# Patient Record
Sex: Male | Born: 1935 | Race: Black or African American | Hispanic: No | Marital: Married | Smoking: Never smoker
Health system: Southern US, Community
[De-identification: ages and names within clinical notes are randomized; demographics above are authoritative.]

## PROBLEM LIST (undated history)

## (undated) DIAGNOSIS — N183 Chronic kidney disease, stage 3 (moderate): Secondary | ICD-10-CM

## (undated) DIAGNOSIS — K760 Fatty (change of) liver, not elsewhere classified: Secondary | ICD-10-CM

## (undated) DIAGNOSIS — I10 Essential (primary) hypertension: Secondary | ICD-10-CM

## (undated) DIAGNOSIS — I5043 Acute on chronic combined systolic (congestive) and diastolic (congestive) heart failure: Secondary | ICD-10-CM

## (undated) DIAGNOSIS — N281 Cyst of kidney, acquired: Secondary | ICD-10-CM

## (undated) DIAGNOSIS — I2510 Atherosclerotic heart disease of native coronary artery without angina pectoris: Secondary | ICD-10-CM

## (undated) DIAGNOSIS — D649 Anemia, unspecified: Secondary | ICD-10-CM

## (undated) HISTORY — DX: Morbid (severe) obesity due to excess calories: E66.01

## (undated) HISTORY — PX: TONSILLECTOMY: SUR1361

## (undated) HISTORY — DX: Essential (primary) hypertension: I10

## (undated) HISTORY — DX: Atherosclerotic heart disease of native coronary artery without angina pectoris: I25.10

---

## 2010-08-25 ENCOUNTER — Institutional Professional Consult (permissible substitution) (INDEPENDENT_AMBULATORY_CARE_PROVIDER_SITE_OTHER): Payer: Medicare Other | Admitting: Cardiovascular Disease

## 2010-08-25 ENCOUNTER — Encounter: Payer: Self-pay | Admitting: Cardiovascular Disease

## 2010-08-25 DIAGNOSIS — R9431 Abnormal electrocardiogram [ECG] [EKG]: Secondary | ICD-10-CM | POA: Insufficient documentation

## 2010-09-01 NOTE — Assessment & Plan Note (Signed)
Summary: consult:ekg changes. q waves- pt has medicade. per dod called...   Visit Type:  Initial Consult Primary Provider:  Dr. [name]  CC:  LE edema.  History of Present Illness: 75 yo AAM with history of HTN and recent lower extremity edema who is referred today for evaluation of an abnormal EKG. He tells me that he has been healthy up until two weeks ago when he began to notice swelling in both legs. He was started on Lasix by primary care and has had considerable improvement in his lower ext edema. He has recently been started medication for DM and HTN. He has no complaints today. No chest pain, SOB, palpitations, near syncope or syncope. EKG in his primary care office reported as abnormal but no records available for review. EKG today with NSR, Q waves III, AVF and poor R wave progression through the precordial leads.   Current Medications (verified): 1)  Lisinopril 20 Mg Tabs (Lisinopril) .Marland Kitchen.. 1 By Mouth Daily 2)  Furosemide 40 Mg Tabs (Furosemide) .Marland Kitchen.. 1 By Mouth Daily 3)  Metformin Hcl 500 Mg Tabs (Metformin Hcl) .Marland Kitchen.. 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  The patient denies fatigue, malaise, fever, weight gain/loss, vision loss, decreased hearing, hoarseness, chest pain, palpitations, shortness of breath, prolonged cough, wheezing, sleep apnea, coughing up blood, abdominal pain, blood in stool, nausea, vomiting, diarrhea, heartburn, incontinence, blood in urine, muscle weakness, joint pain, rash, skin lesions,  headache, fainting, dizziness, depression, anxiety, enlarged lymph nodes, easy bruising or bleeding, and environmental allergies.    Vital Signs:  Patient profile:   75 year old male Height:      70 inches Weight:      309 pounds BMI:     44.50 Pulse rate:   93 / minute Resp:     18 per minute BP sitting:   140 / 90  (left arm)  Vitals Entered By: Marrion Coy, CNA (August 25, 2010 11:30 AM)  Physical Exam  General:  General: Well developed, well nourished, NAD HEENT: OP clear, mucus membranes moist SKIN: warm, dry Neuro: No focal deficits Musculoskeletal: Muscle strength 5/5 all ext Psychiatric: Mood and affect normal Neck: No JVD, no carotid bruits, no thyromegaly, no lymphadenopathy. Lungs:Clear bilaterally, no wheezes, rhonci, crackles CV: RRR no murmurs, gallops rubs Abdomen: soft, NT, ND, BS present Extremities: 1+ bilateral lower ext  edema, pulses 2+.    EKG  Procedure date:  08/25/2010  Findings:      NSR, rate 93 bpm. Q waves III, AVF. Poor R wave progression through precordial leads.   Impression & Recommendations:  Problem # 1:  ABNORMAL ELECTROCARDIOGRAM (ICD-794.31) Will arrange echo to assess LV size and function. No chest pain. No ischemic workup necessary at this time.   His updated medication list for this problem includes:    Lisinopril 20 Mg Tabs (Lisinopril) .Marland Kitchen... 1 by mouth daily  Orders: Echocardiogram (Echo)  Patient Instructions: 1)  Your physician recommends that you schedule a follow-up appointment in: 3-4 weeks with Dr. Clifton James 2)  Your physician  recommends that you continue on your current medications as directed. Please refer to the Current Medication list given to you today. 3)  Your physician has requested that you have an echocardiogram.  Echocardiography is a painless test that uses sound waves to create images of your heart. It provides your doctor with information about the size and shape of your heart and how well your heart's  chambers and valves are working.  This procedure takes approximately one hour. There are no restrictions for this procedure.

## 2010-09-04 ENCOUNTER — Encounter: Payer: Self-pay | Admitting: *Deleted

## 2010-09-10 ENCOUNTER — Other Ambulatory Visit (HOSPITAL_COMMUNITY): Payer: Self-pay | Admitting: Radiology

## 2010-09-10 DIAGNOSIS — R9431 Abnormal electrocardiogram [ECG] [EKG]: Secondary | ICD-10-CM

## 2010-09-11 ENCOUNTER — Ambulatory Visit (HOSPITAL_COMMUNITY): Payer: Medicare Other | Attending: Cardiovascular Disease | Admitting: Radiology

## 2010-09-11 DIAGNOSIS — E669 Obesity, unspecified: Secondary | ICD-10-CM | POA: Insufficient documentation

## 2010-09-11 DIAGNOSIS — I10 Essential (primary) hypertension: Secondary | ICD-10-CM | POA: Insufficient documentation

## 2010-09-11 DIAGNOSIS — R609 Edema, unspecified: Secondary | ICD-10-CM | POA: Insufficient documentation

## 2010-09-11 DIAGNOSIS — R9431 Abnormal electrocardiogram [ECG] [EKG]: Secondary | ICD-10-CM | POA: Insufficient documentation

## 2010-09-11 DIAGNOSIS — E119 Type 2 diabetes mellitus without complications: Secondary | ICD-10-CM | POA: Insufficient documentation

## 2010-09-14 ENCOUNTER — Ambulatory Visit (INDEPENDENT_AMBULATORY_CARE_PROVIDER_SITE_OTHER): Payer: Medicare Other | Admitting: Cardiovascular Disease

## 2010-09-14 ENCOUNTER — Encounter: Payer: Self-pay | Admitting: Cardiovascular Disease

## 2010-09-14 VITALS — BP 160/98 | HR 84 | Wt 310.4 lb

## 2010-09-14 DIAGNOSIS — I5023 Acute on chronic systolic (congestive) heart failure: Secondary | ICD-10-CM

## 2010-09-14 DIAGNOSIS — I5022 Chronic systolic (congestive) heart failure: Secondary | ICD-10-CM

## 2010-09-14 DIAGNOSIS — I10 Essential (primary) hypertension: Secondary | ICD-10-CM | POA: Insufficient documentation

## 2010-09-14 LAB — BASIC METABOLIC PANEL
GFR: 93.15 mL/min (ref >60.00)
Potassium: 4.2 mEq/L (ref 3.5–5.1)
Sodium: 137 mEq/L (ref 135–145)

## 2010-09-14 MED ORDER — ASPIRIN EC 81 MG PO TBEC: 81.0000 mg | DELAYED_RELEASE_TABLET | Freq: Every day | ORAL | Status: AC

## 2010-09-14 MED ORDER — CARVEDILOL 6.25 MG PO TABS: 6.2500 mg | ORAL_TABLET | Freq: Two times a day (BID) | ORAL | Status: DC

## 2010-09-14 MED ORDER — FUROSEMIDE 40 MG PO TABS: 40.0000 mg | ORAL_TABLET | Freq: Two times a day (BID) | ORAL | Status: DC

## 2010-09-14 NOTE — Assessment & Plan Note (Signed)
New diagnosis of systolic dysfunction. LVEF 30% by echo. Will arrange exercise myoview to exclude ischemia. Most likely due to long standing HTN. Will increase Lasix to 40 mg po BID and check BMET today. Will add ASA 81 mg po Qdaily.

## 2010-09-14 NOTE — Patient Instructions (Signed)
Your physician recommends that you schedule a follow-up appointment in: 2-3 weeks with Dr. Clifton James Your physician has recommended you make the following change in your medication: INCREASE LASIX to 40mg  by mouth daily. START COREG 6.25mg  by mouth two times a day. START ASPIRIN 81mg  by mouth daily.  Your physician has requested that you have en exercise stress myoview. For further information please visit [email or web address]. Please follow instruction sheet, as given.

## 2010-09-14 NOTE — Progress Notes (Signed)
History of Present Illness:75 yo AAM with history of HTN and recent lower extremity edema who is here today for cardiac follow up. I saw him as a new patient three weeks ago for evaluation of an abnormal EKG. He told  me that he has been healthy up until 5 weeks ago  when he began to notice swelling in both legs. He was started on Lasix by primary care and had considerable improvement in his lower ext edema for 1-2 weeks. He has recently been started medication for DM and HTN. He had no complaints at the first visit.  EKG at the initial visit with NSR, Q waves III, AVF and poor R wave progression through the precordial leads. I arranged an echo which showed LV systolic dysfunction although the endocardium was difficult to visualize. He has been feeling well. Denies any CP, SOB, palpitations. His lower extremity swelling has increased since last visit.    Past Medical History  Diagnosis Date  . Hypertension   . Diabetes mellitus     No past surgical history on file.  Current Outpatient Prescriptions  Medication Sig Dispense Refill  . furosemide (LASIX) 40 MG tablet Take 40 mg by mouth daily.        Marland Kitchen lisinopril (PRINIVIL,ZESTRIL) 20 MG tablet Take 20 mg by mouth daily.        . metFORMIN (GLUCOPHAGE) 500 MG tablet Take 500 mg by mouth 2 (two) times daily with a meal.          Allergies not on file  History   Social History  . Marital Status: Married    Spouse Name: N/A    Number of Children: 1  . Years of Education: N/A   Occupational History  . driver     [employer]   Social History Main Topics  . Smoking status: Never Smoker   . Smokeless tobacco: Not on file  . Alcohol Use: No  . Drug Use: No  . Sexually Active: Not on file   Other Topics Concern  . Not on file   Social History Narrative   Driver for [employer].    No family history on file.  Review of Systems:  No chest pain, SOB, palpitations, dizziness,  near syncope or syncope.  No PND,  orthopnea, or Lower extremity edema.   BP 160/98  Pulse 84  Wt 310 lb 6.4 oz (140.797 kg)  Physical Examination: General: Well developed, well nourished, NAD HEENT: OP clear, mucus membranes moist SKIN: warm, dry. No rashes. Neuro: No focal deficits Musculoskeletal: Muscle strength 5/5 all ext Psychiatric: Mood and affect normal Neck: No JVD, no carotid bruits, no thyromegaly, no lymphadenopathy. Lungs:Clear bilaterally, no wheezes, rhonci, crackles Cardiovascular: Regular rate and rhythm. No murmurs, gallops or rubs. Abdomen:Soft. Bowel sounds present. Non-tender.  Extremities: 2-3+ bilateral lower  extremity edema. Pulses are difficult to palpate with edema.   Echo: 09/11/10 Left ventricle: The cavity size was mildly dilated. Wall thickness       was increased in a pattern of mild LVH. Systolic function was       moderately to severely reduced. The estimated ejection fraction       was in the range of 30% to 35%. Diffuse hypokinesis. Regional wall       motion abnormalities cannot be excluded.     - Left atrium: The atrium was mildly dilated.     - Right ventricle: The cavity size was mildly dilated.     - Right  atrium: The atrium was mildly dilated.     - Pericardium, extracardiac: A trivial pericardial effusion was       identified.     Impressions:            - Technically difficult; LV function appears to be significantly       reduced on apical images but endocardium not well visualized;       suggest cardiac MRI or MUGA if clinically indicated.

## 2010-09-14 NOTE — Assessment & Plan Note (Signed)
Uncontrolled. Will add Coreg 6.25 mg po BID. Continue Ace-inhibitor.

## 2010-09-28 ENCOUNTER — Ambulatory Visit (HOSPITAL_COMMUNITY): Payer: Medicare Other | Attending: Cardiovascular Disease | Admitting: Radiology

## 2010-09-28 DIAGNOSIS — I491 Atrial premature depolarization: Secondary | ICD-10-CM

## 2010-09-28 DIAGNOSIS — R0602 Shortness of breath: Secondary | ICD-10-CM

## 2010-09-28 DIAGNOSIS — I4949 Other premature depolarization: Secondary | ICD-10-CM

## 2010-09-28 DIAGNOSIS — I5023 Acute on chronic systolic (congestive) heart failure: Secondary | ICD-10-CM

## 2010-09-28 MED ORDER — TECHNETIUM TC 99M TETROFOSMIN IV KIT
33.0000 | PACK | Freq: Once | INTRAVENOUS | Status: AC | PRN
  Administered 2010-09-28: 33 via INTRAVENOUS

## 2010-09-28 MED ORDER — REGADENOSON 0.4 MG/5ML IV SOLN
0.4000 mg | Freq: Once | INTRAVENOUS | Status: AC
  Administered 2010-09-28: 0.4 mg via INTRAVENOUS

## 2010-09-28 NOTE — Progress Notes (Signed)
[facility] SITE 3 NUCLEAR MED [address] [place] [address] [phone]  Cardiology Nuclear Med Study  Tim Herrera is a 75 y.o. male [id number] 04/18/1936   Nuclear Med Background Indication for Stress Test:  Evaluation for Ischemia History: 09/11/10 Echo EF 30-35% Cardiac Risk Factors: Hypertension and NIDDM  Symptoms:  DOE, Palpitations and SOB   Nuclear Pre-Procedure Caffeine/Decaff Intake:  None NPO After: 600 pm   Lungs:  clear IV 0.9% NS with Angio Cath:  22g  IV Site: R Hand  IV Started by:  Cathlyn Parsons, RN  Chest Size (in):  52 in Cup Size: n/a  Height: 5' 10.5" (1.791 m)  Weight:  307 lb (139.254 kg)  BMI:  Body mass index is 43.43 kg/(m^2). Tech Comments:  Cored held x 24 hrs and Metformin held this am and patient does not check Blood sugar every am,none this am.    Nuclear Med Study 1 or 2 day study: 2 Day Stress Test Type:  Eugenie Birks  Reading MD: Olga Millers  Order Authorizing Provider:  C.McAlhany  Resting Radionuclide: Technetium 19m Tetrofosmin  Resting Radionuclide Dose: 33 mCi   Stress Radionuclide:  Technetium 58m Tetrofosmin  Stress Radionuclide Dose: 33 mCi           Stress Protocol Rest HR: 83 Stress HR: 88  Rest BP: 138/98 Stress BP: 165/88  Exercise Time (min): n/a METS: n/a   Predicted Max HR: 148 bpm % Max HR: 59.46 bpm Rate Pressure Product: [id number]   Dose of Adenosine (mg):  n/a Dose of Lexiscan: 0.4 mg  Dose of Atropine (mg): n/a Dose of Dobutamine: n/a mcg/kg/min (at max HR)  Stress Test Technologist: Milana Na, EMT-P  Nuclear Technologist:  Domenic Polite, CNMT     Rest Procedure:  Myocardial perfusion imaging was performed at rest 45 minutes following the intravenous administration of Technetium 53m Tetrofosmin. Rest ECG: NSR with PVCS  Stress Procedure:  The patient received IV Lexiscan 0.4 mg over 15-seconds.  Technetium 6m Tetrofosmin injected at 30-seconds.  There were no significant  changes and rare pvcs with Lexiscan.  Quantitative spect images were obtained after a 45 minute delay. Stress ECG: No significant ST segment change suggestive of ischemia.  QPS Raw Data Images:  Acquisition technically good. Stress Images:  There is decreased uptake in the inferior wall and basal anterior wall. Rest Images:  There is decreased uptake in the inferior wall. Subtraction (SDS):  These findings are consistent with prior inferior infarct and mild ischemia in the basal anterior wall. Transient Ischemic Dilatation (Normal <1.2 ) 1.03 Lung/Heart Ratio (Normal <0.45):  .35   Quantitative Gated Spect Images QGS EDV:  187 ml QGS ESV:  119 ml QGS cine images:  Akinesis of the inferior wall; evidence of left ventricular enlargement. QGS EF: 37%  Impression Exercise Capacity:  Lexiscan with no exercise. BP Response:  Normal blood pressure response. Clinical Symptoms:  No chest pain. ECG Impression:  No significant ST segment change suggestive of ischemia. Comparison with Prior Nuclear Study: No previous nuclear study performed  Overall Impression:  Abnormal stress nuclear study with prior inferior infarct and mild ischemia in the basal anterior wall.   Olga Millers

## 2010-09-29 ENCOUNTER — Ambulatory Visit (HOSPITAL_COMMUNITY): Payer: Medicare Other | Attending: Cardiovascular Disease | Admitting: Radiology

## 2010-09-29 DIAGNOSIS — R0989 Other specified symptoms and signs involving the circulatory and respiratory systems: Secondary | ICD-10-CM

## 2010-09-29 DIAGNOSIS — I10 Essential (primary) hypertension: Secondary | ICD-10-CM | POA: Insufficient documentation

## 2010-09-29 MED ORDER — TECHNETIUM TC 99M TETROFOSMIN IV KIT
33.0000 | PACK | Freq: Once | INTRAVENOUS | Status: AC | PRN
  Administered 2010-09-29: 33 via INTRAVENOUS

## 2010-10-06 NOTE — Progress Notes (Signed)
Abnormal nuclear study. He needs to be set up for a cath. Can we add him onto my schedule for next week or two? Thanks, chris  MCALHANY,CHRISTOPHER 5:17 PM

## 2010-10-07 NOTE — Progress Notes (Signed)
Patient is aware of test/lab results. He will f/u on 10/13/10 @ 11:45 am.

## 2010-10-13 ENCOUNTER — Ambulatory Visit (INDEPENDENT_AMBULATORY_CARE_PROVIDER_SITE_OTHER): Payer: Medicare Other | Admitting: Cardiovascular Disease

## 2010-10-13 ENCOUNTER — Encounter: Payer: Self-pay | Admitting: Cardiology

## 2010-10-13 ENCOUNTER — Encounter: Payer: Self-pay | Admitting: Cardiovascular Disease

## 2010-10-13 VITALS — BP 144/78 | HR 78 | Resp 14 | Ht 70.0 in | Wt 313.0 lb

## 2010-10-13 DIAGNOSIS — R9430 Abnormal result of cardiovascular function study, unspecified: Secondary | ICD-10-CM

## 2010-10-13 DIAGNOSIS — R079 Chest pain, unspecified: Secondary | ICD-10-CM

## 2010-10-13 DIAGNOSIS — Z01810 Encounter for preprocedural cardiovascular examination: Secondary | ICD-10-CM

## 2010-10-13 NOTE — Patient Instructions (Addendum)
Your physician recommends that you schedule a follow-up appointment in: 4 months with Dr. Clifton James  Your physician has requested that you have a cardiac catheterization. Cardiac catheterization is used to diagnose and/or treat various heart conditions. Doctors may recommend this procedure for a number of different reasons. The most common reason is to evaluate chest pain. Chest pain can be a symptom of coronary artery disease (CAD), and cardiac catheterization can show whether plaque is narrowing or blocking your heart's arteries. This procedure is also used to evaluate the valves, as well as measure the blood flow and oxygen levels in different parts of your heart. For further information please visit [email or web address] Please follow instruction sheet, as given. 10/30/10 @ 8:30am with Dr. Clifton James.

## 2010-10-13 NOTE — Progress Notes (Signed)
History of Present Illness:75 yo AAM with history of HTN and recent lower extremity edema who I saw as a new pt 6 weeks ago for evaluation of an abnormal EKG. He tells me that he has been healthy up until two weeks ago when he began to notice swelling in both legs. He was started on Lasix by primary care and has had considerable improvement in his lower ext edema. He has recently been started medication for DM and HTN. No chest pain, SOB, palpitations, near syncope or syncope. EKG today with NSR, Q waves III, AVF and poor R wave progression through the precordial leads. Echo as below.   Past Medical History  Diagnosis Date  . Hypertension   . Diabetes mellitus     No past surgical history on file.  Current Outpatient Prescriptions  Medication Sig Dispense Refill  . aspirin EC 81 MG EC tablet Take 1 tablet (81 mg total) by mouth daily.  150 tablet  2  . carvedilol (COREG) 6.25 MG tablet Take 1 tablet (6.25 mg total) by mouth 2 (two) times daily.  60 tablet  3  . furosemide (LASIX) 40 MG tablet Take 1 tablet (40 mg total) by mouth 2 (two) times daily.  60 tablet  3  . lisinopril (PRINIVIL,ZESTRIL) 20 MG tablet Take 20 mg by mouth daily.        . metFORMIN (GLUCOPHAGE) 500 MG tablet Take 500 mg by mouth 2 (two) times daily with a meal.          No Known Allergies  History   Social History  . Marital Status: Married    Spouse Name: N/A    Number of Children: 1  . Years of Education: N/A   Occupational History  . driver     [employer]   Social History Main Topics  . Smoking status: Never Smoker   . Smokeless tobacco: Not on file  . Alcohol Use: No  . Drug Use: No  . Sexually Active: Not on file   Other Topics Concern  . Not on file   Social History Narrative   Driver for [employer].    No family history on file.  Review of Systems:  As stated in the HPI and otherwise negative.   BP 144/78  Pulse 78  Resp 14  Ht 5\' 10"  (1.778 m)  Wt 313 lb  (141.976 kg)  BMI 44.91 kg/m2  Physical Examination: General: Well developed, well nourished, NAD HEENT: OP clear, mucus membranes moist SKIN: warm, dry. No rashes. Neuro: No focal deficits Musculoskeletal: Muscle strength 5/5 all ext Psychiatric: Mood and affect normal Neck: No JVD, no carotid bruits, no thyromegaly, no lymphadenopathy. Lungs:Clear bilaterally, no wheezes, rhonci, crackles Cardiovascular: Regular rate and rhythm. No murmurs, gallops or rubs. Abdomen:Soft. Bowel sounds present. Non-tender.  Extremities: No lower extremity edema. Pulses are 2 + in the bilateral DP/PT.  Echo: 09/11/10:  Left ventricle: The cavity size was mildly dilated. Wall thickness       was increased in a pattern of mild LVH. Systolic function was       moderately to severely reduced. The estimated ejection fraction       was in the range of 30% to 35%. Diffuse hypokinesis. Regional wall       motion abnormalities cannot be excluded.     - Left atrium: The atrium was mildly dilated.     - Right ventricle: The cavity size was mildly dilated.     -  Right atrium: The atrium was mildly dilated.     - Pericardium, extracardiac: A trivial pericardial effusion was       identified.     Impressions:            - Technically difficult; LV function appears to be significantly       reduced on apical images but endocardium not well visualized;       suggest cardiac MRI or MUGA if clinically indicated.  STRESS MYOVIEW:    Stress Procedure:  The patient received IV Lexiscan 0.4 mg over 15-seconds.  Technetium 42m Tetrofosmin injected at 30-seconds.  There were no significant changes and rare pvcs with Lexiscan.  Quantitative spect images were obtained after a 45 minute delay. Stress ECG: No significant ST segment change suggestive of ischemia.   QPS Raw Data Images:  Acquisition technically good. Stress Images:  There is decreased uptake in the inferior wall and basal anterior wall. Rest Images:   There is decreased uptake in the inferior wall. Subtraction (SDS):  These findings are consistent with prior inferior infarct and mild ischemia in the basal anterior wall. Transient Ischemic Dilatation (Normal <1.2 ) 1.03 Lung/Heart Ratio (Normal <0.45):  .35     Quantitative Gated Spect Images QGS EDV:  187 ml QGS ESV:  119 ml QGS cine images:  Akinesis of the inferior wall; evidence of left ventricular enlargement. QGS EF: 37%   Impression Exercise Capacity:  Lexiscan with no exercise. BP Response:  Normal blood pressure response. Clinical Symptoms:  No chest pain. ECG Impression:  No significant ST segment change suggestive of ischemia. Comparison with Prior Nuclear Study: No previous nuclear study performed   Overall Impression:  Abnormal stress nuclear study with prior inferior infarct and mild ischemia in the basal anterior wall.

## 2010-10-13 NOTE — Assessment & Plan Note (Signed)
Echo with LV sytstolic dysfunction. Stress myoview with possible inferior wall ischemia. Will arrange left heart cath at [facility]. Labs week of procedure. Risks and benefits of procedure reviewed with pt and he agrees to proceed.

## 2010-10-27 ENCOUNTER — Other Ambulatory Visit (INDEPENDENT_AMBULATORY_CARE_PROVIDER_SITE_OTHER): Payer: Medicare Other | Admitting: *Deleted

## 2010-10-27 DIAGNOSIS — Z01810 Encounter for preprocedural cardiovascular examination: Secondary | ICD-10-CM

## 2010-10-27 DIAGNOSIS — R079 Chest pain, unspecified: Secondary | ICD-10-CM

## 2010-10-27 LAB — CBC WITH DIFFERENTIAL/PLATELET
Basophils Relative: 0.7 % (ref 0.0–3.0)
Eosinophils Absolute: 0.6 10*3/uL (ref 0.0–0.7)
Eosinophils Relative: 12.1 % — ABNORMAL HIGH (ref 0.0–5.0)
Hemoglobin: 10.2 g/dL — ABNORMAL LOW (ref 13.0–17.0)
Lymphocytes Relative: 16.2 % (ref 12.0–46.0)
MCHC: 33.2 g/dL (ref 30.0–36.0)
MCV: 82.3 fl (ref 78.0–100.0)
Monocytes Absolute: 0.6 10*3/uL (ref 0.1–1.0)
Neutro Abs: 3 10*3/uL (ref 1.4–7.7)
RBC: 3.73 Mil/uL — ABNORMAL LOW (ref 4.22–5.81)
WBC: 5 10*3/uL (ref 4.5–10.5)

## 2010-10-27 LAB — PROTIME-INR: INR: 1.2 ratio — ABNORMAL HIGH (ref 0.8–1.0)

## 2010-10-27 LAB — BASIC METABOLIC PANEL
CO2: 27 mEq/L (ref 19–32)
Chloride: 105 mEq/L (ref 96–112)
Potassium: 3.8 mEq/L (ref 3.5–5.1)
Sodium: 141 mEq/L (ref 135–145)

## 2010-10-30 ENCOUNTER — Inpatient Hospital Stay (HOSPITAL_BASED_OUTPATIENT_CLINIC_OR_DEPARTMENT_OTHER)
Admission: RE | Admit: 2010-10-30 | Discharge: 2010-10-30 | Disposition: A | Discharge status: Home / Self Care | Payer: Medicare Other | Source: Ambulatory Visit | Attending: Cardiovascular Disease | Admitting: Cardiovascular Disease

## 2010-10-30 DIAGNOSIS — I2510 Atherosclerotic heart disease of native coronary artery without angina pectoris: Secondary | ICD-10-CM | POA: Insufficient documentation

## 2010-10-30 DIAGNOSIS — R9431 Abnormal electrocardiogram [ECG] [EKG]: Secondary | ICD-10-CM | POA: Insufficient documentation

## 2010-10-30 DIAGNOSIS — R609 Edema, unspecified: Secondary | ICD-10-CM | POA: Insufficient documentation

## 2010-10-30 DIAGNOSIS — I10 Essential (primary) hypertension: Secondary | ICD-10-CM | POA: Insufficient documentation

## 2010-10-30 DIAGNOSIS — E119 Type 2 diabetes mellitus without complications: Secondary | ICD-10-CM | POA: Insufficient documentation

## 2010-10-30 LAB — POCT I-STAT GLUCOSE
Glucose, Bld: 168 mg/dL — ABNORMAL HIGH (ref 70–99)
Operator id: 122531

## 2010-11-06 ENCOUNTER — Other Ambulatory Visit (INDEPENDENT_AMBULATORY_CARE_PROVIDER_SITE_OTHER): Payer: Medicare Other | Admitting: *Deleted

## 2010-11-06 DIAGNOSIS — E876 Hypokalemia: Secondary | ICD-10-CM

## 2010-11-06 LAB — BASIC METABOLIC PANEL
Chloride: 104 mEq/L (ref 96–112)
GFR: 63.36 mL/min (ref >60.00)
Potassium: 4.5 mEq/L (ref 3.5–5.1)

## 2010-11-10 NOTE — Cardiovascular Report (Signed)
NAMELADD, CEN                ACCOUNT NO.:  [id number]  MEDICAL RECORD NO.:  [id number]           PATIENT TYPE:  LOCATION:                                 FACILITY:  PHYSICIAN:  Verne Carrow, MDDATE OF BIRTH:  [birth year]  DATE OF PROCEDURE:  10/30/2010 DATE OF DISCHARGE:                           CARDIAC CATHETERIZATION   PROCEDURES PERFORMED: 1. Left heart catheterization 2. Selective coronary angiography.  OPERATOR:  Verne Carrow, MD  INDICATIONS:  This is a 75 year old African American male with a history of hypertension, lower extremity edema, and recently diagnosed diabetes mellitus, who I saw in the office several months ago for evaluation of an abnormal EKG.  He has recently noticed swelling in his legs but denied any chest pain or shortness of breath.  I arranged an echocardiogram which showed reduced left ventricular systolic function with ejection fraction of 30-35%.  There was diffuse hypokinesis.  We then arranged a stress Myoview which showed possible inferior wall ischemia.  Left ventricular ejection fraction on the Myoview was 37%. Because of this, we arranged a diagnostic left heart catheterization to exclude obstructive coronary artery disease in the major epicardial vessels.  DETAILS OF PROCEDURE:  The patient was brought to the outpatient cardiac catheterization laboratory after signing informed consent for the procedure.  The right groin was prepped and draped in sterile fashion. A 1% lidocaine was used for local anesthesia.  A 4-French sheath was inserted into the right femoral artery without difficulty.  We then attempted to perform angiography of the left coronary system with a 4- French catheter.  However, there was poor opacification of the vessel secondary to the patient's size.  We changed her sheath out for a 5- [place] sheath and used a 5-French JL-5 diagnostic catheter to selectively engage the left main artery.  We then used  a 4-French 3D RC catheter to selectively engage and inject the right coronary artery.  A pigtail catheter was used across the aortic valve into the left ventricle.  The patient's left ventricular end-diastolic filling pressure was elevated.  Because of this, we did not perform a left ventricular angiogram.  We did pull a pigtail catheter back in an attempt to perform a distal aortogram with 20 mL of contrast dye.  Due to the patient's size, there was poor visualization of the distal aorta and renal arteries.  At this point, we stopped the procedure.  There were no immediate complications.  The patient was taken to the recovery area in stable condition.  HEMODYNAMIC FINDINGS:  Central aortic pressure 165/92, left ventricular pressure 171/25/37.  ANGIOGRAPHIC FINDINGS: 1. The left main coronary artery had no evidence of disease. 2. The left anterior descending was a large vessel that coursed to the     apex and gave off 2 small-to-moderate-sized diagonal branches.     There was mild plaque disease in the mid LAD.  Both diagonal     branches had mild plaque disease but no obstructive lesions. 3. The circumflex artery was a large-caliber vessel that gave off 2     early small-caliber obtuse marginal branches and a moderate-to-  large size bifurcating third obtuse marginal branch.  There was     mild diffuse 20% plaque in the proximal mid circumflex in the third     obtuse marginal branch.  There were no obstructive lesions noted in     this vessel. 4. The right coronary artery is a large dominant vessel with mild 20%     plaque in the proximal midportion of the vessel.  The posterior     descending artery becomes small caliber in its distal portion.  The     very small-caliber distal posterior descending artery has a 99%     subtotal occlusion. 5. No left ventricular angiogram was performed.  IMPRESSION: 1. Nonobstructive coronary artery disease in the major epicardial      vessels. 2. Severe subtotal occlusion in a very small-caliber posterior     descending artery branch.  This vessel is too small for     intervention.  The vessel was approximately a 1.5-1.75-mm vessel.  RECOMMENDATIONS:  At this point, I recommend continued medical management of this patient's nonischemic cardiomyopathy.  I do not believe that the obstruction in the very small posterior descending artery can explain his overall global left ventricular systolic dysfunction.  We will continue his ACE inhibitor and will increase his beta-blocker for better blood pressure control.  We will also continue his aspirin and will start a statin.  We will hold his metformin for 48 hours.     Verne Carrow, MD     CM/MEDQ  D:  10/30/2010  T:  10/30/2010  Job:  [id number]  Electronically Signed by Verne Carrow MD on 11/10/2010 08:47:55 AM

## 2010-11-17 ENCOUNTER — Ambulatory Visit (INDEPENDENT_AMBULATORY_CARE_PROVIDER_SITE_OTHER): Payer: Medicare Other | Admitting: Cardiovascular Disease

## 2010-11-17 DIAGNOSIS — R0989 Other specified symptoms and signs involving the circulatory and respiratory systems: Secondary | ICD-10-CM

## 2010-12-01 ENCOUNTER — Other Ambulatory Visit: Payer: Self-pay | Admitting: Cardiovascular Disease

## 2010-12-18 ENCOUNTER — Encounter: Payer: Self-pay | Admitting: Cardiovascular Disease

## 2011-01-14 ENCOUNTER — Encounter: Payer: Self-pay | Admitting: Cardiovascular Disease

## 2011-06-29 ENCOUNTER — Other Ambulatory Visit: Payer: Self-pay | Admitting: Cardiovascular Disease

## 2011-07-01 NOTE — Telephone Encounter (Signed)
Can you check on this pat, thanks, Tim Herrera

## 2011-08-31 ENCOUNTER — Telehealth: Payer: Self-pay | Admitting: Cardiovascular Disease

## 2011-08-31 NOTE — Telephone Encounter (Signed)
LOV faxed to [facility]  @ [phone] 08/31/11/KM

## 2011-11-23 ENCOUNTER — Ambulatory Visit (INDEPENDENT_AMBULATORY_CARE_PROVIDER_SITE_OTHER): Payer: Medicare Other | Admitting: Cardiovascular Disease

## 2011-11-23 ENCOUNTER — Encounter: Payer: Self-pay | Admitting: Cardiovascular Disease

## 2011-11-23 VITALS — BP 146/84 | HR 68 | Ht 70.5 in | Wt 310.8 lb

## 2011-11-23 DIAGNOSIS — I2510 Atherosclerotic heart disease of native coronary artery without angina pectoris: Secondary | ICD-10-CM | POA: Insufficient documentation

## 2011-11-23 DIAGNOSIS — I428 Other cardiomyopathies: Secondary | ICD-10-CM | POA: Insufficient documentation

## 2011-11-23 MED ORDER — LISINOPRIL 40 MG PO TABS: 40.0000 mg | ORAL_TABLET | Freq: Every day | ORAL | Status: DC

## 2011-11-23 NOTE — Assessment & Plan Note (Signed)
BP elevated. Will increase Lisinopril to 40 mg po Qdaily. Repeat BMET in one week.

## 2011-11-23 NOTE — Patient Instructions (Signed)
Your physician wants you to follow-up in: 6 months.  You will receive a reminder letter in the mail two months in advance. If you don't receive a letter, please call our office to schedule the follow-up appointment.  Your physician has requested that you have an echocardiogram. Echocardiography is a painless test that uses sound waves to create images of your heart. It provides your doctor with information about the size and shape of your heart and how well your heart's chambers and valves are working. This procedure takes approximately one hour. There are no restrictions for this procedure. To be done in 7-10 days   Your physician recommends that you return for lab work on day of echo in 7-10 days.  BMP  Your physician has recommended you make the following change in your medication: Increase Lisinopril to 40 mg by mouth daily.

## 2011-11-23 NOTE — Progress Notes (Signed)
History of Present Illness: 76 yo AAM with history of HTN and  lower extremity edema who I last saw in 2012 who is here today for cardiac follow up. He was initially seen for evaluation of an abnormal EKG. He also had LE edema.  He was started on Lasix by primary care and has had considerable improvement in his lower ext edema. EKG showed NSR, Q waves III, AVF and poor R wave progression through the precordial leads. Echo showed mild LVH with moderate LV systolic dysfunction with LVEF of 30-35%. Stress myoview showed inferior wall scar with small area of possible apical ischemia. Cardiac cath 10/30/10 showed mild plaque in the LAD and Circumflex with severe disease in a very small PDA branch. We pursued medical management at that time.   He tells me today that he feels great. He has no chest pains or SOB. His weight is stable. He has been using Lasix daily. No complaints this am.    Primary Care Physician: [name]   Past Medical History  Diagnosis Date  . Hypertension   . Diabetes mellitus   . Cardiomyopathy, nonischemic   . CAD (coronary artery disease)     Cardiac cath May 2012 with mild plaque LAD and Circumflex and severe disease in  very small PDA branch of RCA.     No past surgical history on file.  Current Outpatient Prescriptions  Medication Sig Dispense Refill  . albuterol (VENTOLIN HFA) 108 (90 BASE) MCG/ACT inhaler Inhale 2 puffs into the lungs every 4 (four) hours as needed.      Marland Kitchen aspirin 81 MG tablet Take 81 mg by mouth daily.      Marland Kitchen atorvastatin (LIPITOR) 20 MG tablet Take 20 mg by mouth daily.      . carvedilol (COREG) 6.25 MG tablet Take 1 tablet (6.25 mg total) by mouth 2 (two) times daily.  60 tablet  3  . ferrous sulfate 325 (65 FE) MG tablet Take 325 mg by mouth daily with breakfast.      . furosemide (LASIX) 20 MG tablet Take 20 mg by mouth 2 (two) times daily. 3 tablets po twice a day total 120 mg daily      . isosorbide dinitrate (ISORDIL) 30 MG tablet Take  30 mg by mouth daily.      Marland Kitchen KLOR-CON M20 20 MEQ tablet TAKE 1 TABLET BY MOUTH DAILY  30 tablet  6  . lisinopril (PRINIVIL,ZESTRIL) 20 MG tablet Take 20 mg by mouth daily.        . metFORMIN (GLUCOPHAGE) 500 MG tablet Take 500 mg by mouth 2 (two) times daily with a meal.          No Known Allergies  History   Social History  . Marital Status: Married    Spouse Name: N/A    Number of Children: 1  . Years of Education: N/A   Occupational History  . driver     [employer]   Social History Main Topics  . Smoking status: Never Smoker   . Smokeless tobacco: Not on file  . Alcohol Use: No  . Drug Use: No  . Sexually Active: Not on file   Other Topics Concern  . Not on file   Social History Narrative   Driver for [employer].    No family history on file.  Review of Systems:  As stated in the HPI and otherwise negative.   BP 146/84  Pulse 68  Ht 5'  10.5" (1.791 m)  Wt 310 lb 12.8 oz (140.978 kg)  BMI 43.96 kg/m2  Physical Examination: General: Well developed, well nourished, NAD HEENT: OP clear, mucus membranes moist SKIN: warm, dry. No rashes. Neuro: No focal deficits Musculoskeletal: Muscle strength 5/5 all ext Psychiatric: Mood and affect normal Neck: No JVD, no carotid bruits, no thyromegaly, no lymphadenopathy. Lungs:Clear bilaterally, no wheezes, rhonci, crackles Cardiovascular: Regular rate and rhythm. No murmurs, gallops or rubs. Abdomen:Soft. Bowel sounds present. Non-tender.  Extremities: Trace to 1+ bilateral lower extremity edema. Pulses are difficult to palpate secondary to edema.   Cardiac cath 10/30/10:  1. The left main coronary artery had no evidence of disease.  2. The left anterior descending was a large vessel that coursed to the  apex and gave off 2 small-to-moderate-sized diagonal branches.  There was mild plaque disease in the mid LAD. Both diagonal  branches had mild plaque disease but no obstructive lesions.  3. The  circumflex artery was a large-caliber vessel that gave off 2  early small-caliber obtuse marginal branches and a moderate-to-  large size bifurcating third obtuse marginal branch. There was  mild diffuse 20% plaque in the proximal mid circumflex in the third  obtuse marginal branch. There were no obstructive lesions noted in  this vessel.  4. The right coronary artery is a large dominant vessel with mild 20%  plaque in the proximal midportion of the vessel. The posterior  descending artery becomes small caliber in its distal portion. The  very small-caliber distal posterior descending artery has a 99%  subtotal occlusion.

## 2011-11-23 NOTE — Assessment & Plan Note (Signed)
Stable. Continue medical therapy 

## 2011-11-23 NOTE — Assessment & Plan Note (Addendum)
He is on good medical therapy. He has been lost to follow up over the last year. I will repeat his echo. If his LVEF is still below 35%, he will need consideration for an ICD for primary prevention of sudden cardiac death. He is willing to consider this. Continue current medical therapy.

## 2011-11-26 ENCOUNTER — Encounter: Payer: Self-pay | Admitting: Cardiovascular Disease

## 2011-12-02 ENCOUNTER — Other Ambulatory Visit (HOSPITAL_COMMUNITY): Payer: Medicare Other

## 2011-12-02 ENCOUNTER — Other Ambulatory Visit: Payer: Medicare Other

## 2011-12-03 ENCOUNTER — Ambulatory Visit (HOSPITAL_COMMUNITY): Payer: Medicare Other | Attending: Cardiovascular Disease | Admitting: Radiology

## 2011-12-03 ENCOUNTER — Other Ambulatory Visit: Payer: Self-pay | Admitting: *Deleted

## 2011-12-03 ENCOUNTER — Other Ambulatory Visit (INDEPENDENT_AMBULATORY_CARE_PROVIDER_SITE_OTHER): Payer: Medicare Other

## 2011-12-03 DIAGNOSIS — I428 Other cardiomyopathies: Secondary | ICD-10-CM

## 2011-12-03 DIAGNOSIS — E119 Type 2 diabetes mellitus without complications: Secondary | ICD-10-CM | POA: Insufficient documentation

## 2011-12-03 DIAGNOSIS — R609 Edema, unspecified: Secondary | ICD-10-CM | POA: Insufficient documentation

## 2011-12-03 DIAGNOSIS — I517 Cardiomegaly: Secondary | ICD-10-CM | POA: Insufficient documentation

## 2011-12-03 DIAGNOSIS — I10 Essential (primary) hypertension: Secondary | ICD-10-CM | POA: Insufficient documentation

## 2011-12-03 DIAGNOSIS — I509 Heart failure, unspecified: Secondary | ICD-10-CM | POA: Insufficient documentation

## 2011-12-03 DIAGNOSIS — I5020 Unspecified systolic (congestive) heart failure: Secondary | ICD-10-CM | POA: Insufficient documentation

## 2011-12-03 LAB — BASIC METABOLIC PANEL
Chloride: 107 mEq/L (ref 96–112)
Creatinine, Ser: 2 mg/dL — ABNORMAL HIGH (ref 0.4–1.5)
GFR: 43.45 mL/min — ABNORMAL LOW (ref >60.00)
Potassium: 4 mEq/L (ref 3.5–5.1)

## 2011-12-03 MED ORDER — LISINOPRIL 20 MG PO TABS: 20.0000 mg | ORAL_TABLET | Freq: Every day | ORAL | Status: DC

## 2011-12-03 NOTE — Progress Notes (Signed)
Echocardiogram performed.  

## 2011-12-06 ENCOUNTER — Telehealth: Payer: Self-pay | Admitting: *Deleted

## 2011-12-06 NOTE — Telephone Encounter (Signed)
Left message for patient to return my call. Patient need EP evaluation per [place].

## 2011-12-10 ENCOUNTER — Other Ambulatory Visit (INDEPENDENT_AMBULATORY_CARE_PROVIDER_SITE_OTHER): Payer: Medicare Other

## 2011-12-10 DIAGNOSIS — I428 Other cardiomyopathies: Secondary | ICD-10-CM

## 2011-12-10 LAB — BASIC METABOLIC PANEL
BUN: 39 mg/dL — ABNORMAL HIGH (ref 6–23)
Chloride: 107 mEq/L (ref 96–112)
Creatinine, Ser: 1.9 mg/dL — ABNORMAL HIGH (ref 0.4–1.5)
Glucose, Bld: 94 mg/dL (ref 70–99)
Potassium: 3.9 mEq/L (ref 3.5–5.1)

## 2011-12-17 ENCOUNTER — Other Ambulatory Visit: Payer: Self-pay | Admitting: *Deleted

## 2011-12-17 DIAGNOSIS — I5022 Chronic systolic (congestive) heart failure: Secondary | ICD-10-CM

## 2011-12-17 MED ORDER — FUROSEMIDE 20 MG PO TABS: 40.0000 mg | ORAL_TABLET | Freq: Two times a day (BID) | ORAL | Status: DC

## 2011-12-27 ENCOUNTER — Other Ambulatory Visit: Payer: Medicare Other

## 2012-01-06 ENCOUNTER — Encounter: Payer: Self-pay | Admitting: Internal Medicine

## 2012-01-06 ENCOUNTER — Ambulatory Visit (INDEPENDENT_AMBULATORY_CARE_PROVIDER_SITE_OTHER): Payer: Medicare Other | Admitting: Internal Medicine

## 2012-01-06 VITALS — BP 156/90 | HR 86 | Ht 70.0 in | Wt 282.0 lb

## 2012-01-06 DIAGNOSIS — I5022 Chronic systolic (congestive) heart failure: Secondary | ICD-10-CM

## 2012-01-06 MED ORDER — CARVEDILOL 3.125 MG PO TABS: 3.1250 mg | ORAL_TABLET | Freq: Two times a day (BID) | ORAL | Status: DC

## 2012-01-06 NOTE — Patient Instructions (Addendum)
Your physician has recommended you make the following change in your medication: restart Carvedilol 3.125mg  1 tablet twice a day  You have been referred to the Heart Failure Clinic  One week before your heart failure clinic appoint, Dr. Graciela Husbands would like for you to increase your furosemide (lasix) to 2 tablets in the morning and 1 tablet in the afternoon.

## 2012-01-06 NOTE — Progress Notes (Signed)
ELECTROPHYSIOLOGY CONSULT NOTE  Patient ID: Tim Herrera, MRN: [id number], DOB/AGE: 22-May-1936 76 y.o. Admit date: (Not on file) Date of Consult: 01/06/2012  Primary Physician: [name], MD Primary Cardiologist: CM  Chief Complaint: ?ICD   HPI Tim Herrera is a 76 y.o. male  with a history of left ventricular dysfunction in the setting of modest coronary artery disease demonstrated catheterization May 2012. Echocardiogram June 2013 demonstrated persistent left ventricular dysfunction with ejection fraction 25-30% with biatrial enlargement and right ventricular dysfunction without evidence of pulmonary hypertension  He currently takes ACE inhibitors and was on beta blockers at his last visit. He currently is not and does not know why.  He has significant exercise intolerance. He is limited to less than a flight of stairs. He says he can walk 100 yards on flat but not previously. He has some nocturnal dyspnea. He has had severe peripheral edema and this is improved.  He has renal insufficiency. He has diabetes. He has been set up for a sleep study.  He denies palpitations or syncope  Past Medical History  Diagnosis Date  . Hypertension   . Diabetes mellitus   . Cardiomyopathy, nonischemic   . CAD (coronary artery disease)     Cardiac cath May 2012 with mild plaque LAD and Circumflex and severe disease in  very small PDA branch of RCA.       Surgical History: No past surgical history on file.   Home Meds: Prior to Admission medications   Medication Sig Start Date End Date Taking? Authorizing Provider  albuterol (VENTOLIN HFA) 108 (90 BASE) MCG/ACT inhaler Inhale 2 puffs into the lungs every 4 (four) hours as needed.   Yes Historical Provider, MD  aspirin 81 MG tablet Take 81 mg by mouth daily.   Yes Historical Provider, MD  atorvastatin (LIPITOR) 20 MG tablet Take 20 mg by mouth daily.   Yes Historical Provider, MD  ferrous sulfate 325 (65 FE) MG tablet Take 325 mg by mouth  daily with breakfast.   Yes Historical Provider, MD  furosemide (LASIX) 20 MG tablet Take 2 tablets (40 mg total) by mouth 2 (two) times daily. 12/17/11  Yes Kathleene Hazel, MD  isosorbide dinitrate (ISORDIL) 30 MG tablet Take 30 mg by mouth daily.   Yes Historical Provider, MD  KLOR-CON M20 20 MEQ tablet TAKE 1 TABLET BY MOUTH DAILY 06/29/11  Yes Kathleene Hazel, MD  lisinopril (PRINIVIL,ZESTRIL) 20 MG tablet Take 1 tablet (20 mg total) by mouth daily. 12/03/11  Yes Kathleene Hazel, MD  metFORMIN (GLUCOPHAGE) 500 MG tablet Take 500 mg by mouth 2 (two) times daily with a meal.     Yes Historical Provider, MD      Allergies: No Known Allergies  History   Social History  . Marital Status: Married    Spouse Name: N/A    Number of Children: 1  . Years of Education: N/A   Occupational History  . driver     [employer]   Social History Main Topics  . Smoking status: Never Smoker   . Smokeless tobacco: Not on file  . Alcohol Use: No  . Drug Use: No  . Sexually Active: Not on file   Other Topics Concern  . Not on file   Social History Narrative   Driver for [employer].     No family history on file.   ROS:  Please see the history of present illness.     All other  systems reviewed and negative.    Physical Exam: Blood pressure 156/90, pulse 86, height 5\' 10"  (1.778 m), weight 282 lb (127.914 kg). General: Well developed, well nourished morbidly obese aged. African American  male in no acute distress. Head: Normocephalic, atraumatic, sclera non-icteric, no xanthomas, nares are without discharge. Lymph Nodes:  none Back: without scoliosis/kyphosis, no CVA tendersness Neck: Negative for carotid bruits. JVD greater then 10 cm  Lungs: Clear bilaterally to auscultation without wheezes, rales, or rhonchi. Breathing is unlabored. Heart: RRR with S1 S2. 2/6 systolic murmur , No rubs, or gallops appreciated. Abdomen: Soft, non-tender,  non-distended with normoactive bowel sounds. No hepatomegaly. No rebound/guarding. No obvious abdominal masses. Msk:  Strength and tone appear normal for age. Extremities: No clubbing or cyanosis.**+3 edema.  Distal pedal pulses are 2+ and equal bilaterally. Skin: Warm and Dry Neuro: Alert and oriented X 3. CN III-XII intact Grossly normal sensory and motor function . Psych:  Responds to questions appropriately with a normal affect.      Labs: Cardiac Enzymes No results found for this basename: CKTOTAL:4,CKMB:4,TROPONINI:4 in the last 72 hours CBC Lab Results  Component Value Date   WBC 5.0 10/27/2010   HGB 10.2* 10/27/2010   HCT 30.7* 10/27/2010   MCV 82.3 10/27/2010   PLT 286.0 10/27/2010   PROTIME: No results found for this basename: LABPROT:3,INR:3 in the last 72 hours Chemistry No results found for this basename: NA,K,CL,CO2,BUN,CREATININE,CALCIUM,LABALBU,PROT,BILITOT,ALKPHOS,ALT,AST,GLUCOSE in the last 168 hours   Radiology/Studies:  No results found.  EKG:    Assessment and Plan: Sherryl Manges

## 2012-01-06 NOTE — Assessment & Plan Note (Signed)
His heart failure is class 2-3. His volume overloaded. As noted above will refer the heart failure clinic and work on augmenting diuresis. He will also followup with his sleep study.

## 2012-01-06 NOTE — Assessment & Plan Note (Signed)
Tim Herrera has a nonischemic cardiomyopathy with some associated coronary disease. His ACE inhibitors have been long-standing. His beta blockers were recently discontinued. He is not on aldosterone antagonists. I wonder whether there is a for drugs up titration whereby we might not improve his left ventricular function to the point where ICD implantation may not be necessary.  To that end I have taken the liberty of resuming his carvedilol low-dose. I've also taken the liberty of referring him to the heart failure clinic for drugs up titration and the initiation of aldosterone antagonism which will need to be done very carefully as will his diuresis given his renal insufficiency.  Will anticipate increasing his diuretics as the week before he goes this was not too great to much stress on his kidneys.  After the drug titration, reassessment of left ventricular function would be appropriate for consideration of ICD therapy

## 2012-01-10 ENCOUNTER — Encounter (HOSPITAL_COMMUNITY): Payer: Self-pay

## 2012-01-10 ENCOUNTER — Ambulatory Visit (HOSPITAL_COMMUNITY)
Admission: RE | Admit: 2012-01-10 | Discharge: 2012-01-10 | Disposition: A | Discharge status: Home / Self Care | Payer: Medicare Other | Source: Ambulatory Visit | Attending: Internal Medicine | Admitting: Internal Medicine

## 2012-01-10 VITALS — BP 159/90 | HR 82 | Ht 70.0 in | Wt 281.4 lb

## 2012-01-10 DIAGNOSIS — I10 Essential (primary) hypertension: Secondary | ICD-10-CM

## 2012-01-10 DIAGNOSIS — I5022 Chronic systolic (congestive) heart failure: Secondary | ICD-10-CM

## 2012-01-10 MED ORDER — SPIRONOLACTONE 25 MG PO TABS: 12.5000 mg | ORAL_TABLET | Freq: Two times a day (BID) | ORAL | Status: DC

## 2012-01-10 MED ORDER — SPIRONOLACTONE 25 MG PO TABS: 25.0000 mg | ORAL_TABLET | Freq: Every day | ORAL | Status: DC

## 2012-01-10 NOTE — Assessment & Plan Note (Addendum)
BP up. Start Spironolactone 25 mg daily.

## 2012-01-10 NOTE — Assessment & Plan Note (Addendum)
NHYA II. Volume status slightly elevated. Compliant with medications, however does not weigh daily. Patient's BP 150/90 today. Will start 25 mg Spironolactone daily and check BMET next week. Will follow up in 2 weeks with goal of continuing to titrate medications. Extensive education with patient about weighing himself daily, when to call the clinic, and Na restriction. Provided patient with scale and HF education book. Would like to titrate BB next visit.

## 2012-01-10 NOTE — Patient Instructions (Addendum)
Start Spironolactone 25 mg daily.  Get labs checked at [facility] 8/5  Follow up two weeks.  Do the following things EVERYDAY: 1) Weigh yourself in the morning before breakfast. Write it down and keep it in a log. 2) Take your medicines as prescribed 3) Eat low salt foods-Limit salt (sodium) to 2000 mg per day.  4) Stay as active as you can everyday 5) Limit all fluids for the day to less than 2 liters   Any questions or concerns call [phone].

## 2012-01-17 ENCOUNTER — Other Ambulatory Visit (INDEPENDENT_AMBULATORY_CARE_PROVIDER_SITE_OTHER): Payer: Medicare Other

## 2012-01-17 DIAGNOSIS — I5022 Chronic systolic (congestive) heart failure: Secondary | ICD-10-CM

## 2012-01-17 LAB — BASIC METABOLIC PANEL
BUN: 50 mg/dL — ABNORMAL HIGH (ref 6–23)
Chloride: 110 mEq/L (ref 96–112)
Potassium: 4.2 mEq/L (ref 3.5–5.1)

## 2012-01-26 ENCOUNTER — Ambulatory Visit (HOSPITAL_COMMUNITY)
Admission: RE | Admit: 2012-01-26 | Discharge: 2012-01-26 | Disposition: A | Discharge status: Home / Self Care | Payer: Medicare Other | Source: Ambulatory Visit | Attending: Internal Medicine | Admitting: Internal Medicine

## 2012-01-26 VITALS — BP 148/88 | HR 80 | Wt 297.2 lb

## 2012-01-26 DIAGNOSIS — I129 Hypertensive chronic kidney disease with stage 1 through stage 4 chronic kidney disease, or unspecified chronic kidney disease: Secondary | ICD-10-CM | POA: Insufficient documentation

## 2012-01-26 DIAGNOSIS — I5022 Chronic systolic (congestive) heart failure: Secondary | ICD-10-CM

## 2012-01-26 DIAGNOSIS — N183 Chronic kidney disease, stage 3 unspecified: Secondary | ICD-10-CM

## 2012-01-26 DIAGNOSIS — I10 Essential (primary) hypertension: Secondary | ICD-10-CM

## 2012-01-26 DIAGNOSIS — N189 Chronic kidney disease, unspecified: Secondary | ICD-10-CM

## 2012-01-26 DIAGNOSIS — N184 Chronic kidney disease, stage 4 (severe): Secondary | ICD-10-CM | POA: Insufficient documentation

## 2012-01-26 HISTORY — DX: Chronic kidney disease, stage 3 unspecified: N18.30

## 2012-01-26 LAB — BASIC METABOLIC PANEL
CO2: 24 mEq/L (ref 19–32)
Glucose, Bld: 79 mg/dL (ref 70–99)
Potassium: 4.5 mEq/L (ref 3.5–5.1)
Sodium: 141 mEq/L (ref 135–145)

## 2012-01-26 MED ORDER — HYDRALAZINE HCL 25 MG PO TABS: 12.5000 mg | ORAL_TABLET | Freq: Three times a day (TID) | ORAL | Status: DC

## 2012-01-26 MED ORDER — ISOSORBIDE MONONITRATE ER 30 MG PO TB24: 30.0000 mg | ORAL_TABLET | Freq: Every day | ORAL | Status: DC

## 2012-01-26 NOTE — Progress Notes (Addendum)
Referring Physician: Dr. Graciela Husbands Primary Care: [facility] Primary Cardiologist: Dr. Clifton James  Weight Range: 277-280 lbs  HPI: [name] is a 76 yo AAM with history of HTN, NICM (EF 30-35%), chronic systolic heart failure, and nonobstructive CAD (cath 10/2010). 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  He lives with his wife and completes all ADLs.   Cath 5/12:  1) Nonobstructive coronary artery disease in the major epicardial vessels.  2. Severe subtotal occlusion in a very small-caliber posterior descending artery branch.  This vessel is too small for intervention. The vessel was approximately a 1.5-1.75-mm vessel.  ECHO 12/03/11: LVEF 25% to 30%. Diffuse hypokinesis, severe hypokinesis of the entire inferior myocardium.  He returns for 2 week follow up today.  Last visit volume status was mildly elevated and spironolactone 25 mg was added.  His weight has been trending up since this time, from 272 to 290 pounds per home records.  He has not taken extra lasix.  He notes abdominal distention and lower extremity edema.  Breathing "ok if I take my time".  No orthopnea/PND.  Upon further questioning, he is drinking 2-3 liters of sweet tea/water a day.  He states he is follow a low sodium diet though.  Of note, patient is unsure what medications he is on so our nurse Heather called the pharmacy and he has not picked up his isordil or lisinopril in months.  His PCP was also contacted because there was a pill that was cut in half which he was also unclear of the name.  After discussion with the nurse at Dr. Edd Fabian office this was his amaryl.     Review of Systems: All pertinent positives and negatives as in HPI, otherwise negative.    Past Medical History  Diagnosis Date  . Hypertension   . Diabetes mellitus   . Cardiomyopathy, nonischemic   . CAD (coronary artery disease)     Cardiac cath May 2012 with mild plaque LAD and  Circumflex and severe disease in  very small PDA branch of RCA.     Current Outpatient Prescriptions  Medication Sig Dispense Refill  . albuterol (VENTOLIN HFA) 108 (90 BASE) MCG/ACT inhaler Inhale 2 puffs into the lungs every 4 (four) hours as needed.      Marland Kitchen aspirin 81 MG tablet Take 81 mg by mouth daily.      Marland Kitchen atorvastatin (LIPITOR) 20 MG tablet Take 20 mg by mouth daily.      . carvedilol (COREG) 3.125 MG tablet Take 1 tablet (3.125 mg total) by mouth 2 (two) times daily with a meal.      . ferrous sulfate 325 (65 FE) MG tablet Take 325 mg by mouth daily with breakfast.      . furosemide (LASIX) 20 MG tablet Take 2 tablets (40 mg total) by mouth 2 (two) times daily.  60 tablet  6  . glimepiride (AMARYL) 2 MG tablet Take 2 mg by mouth daily before breakfast.      . KLOR-CON M20 20 MEQ tablet TAKE 1 TABLET BY MOUTH DAILY  30 tablet  6  . metFORMIN (GLUCOPHAGE) 500 MG tablet Take 500 mg by mouth 2 (two) times daily with a meal.        . spironolactone (ALDACTONE) 25 MG tablet Take 1 tablet (25 mg total) by mouth daily.  30 tablet  3  . isosorbide dinitrate (ISORDIL) 30 MG tablet Take 30 mg by  mouth daily.      Marland Kitchen lisinopril (PRINIVIL,ZESTRIL) 20 MG tablet Take 1 tablet (20 mg total) by mouth daily.  30 tablet  6  HASNT PICKED UP ISORDIL OR LISINOPRIL Amaryl recently cut in half by PCP  No Known Allergies    PHYSICAL EXAM: Filed Vitals:   01/26/12 1025  BP: 148/88  Pulse: 80  Weight: 297 lb 4 oz (134.832 kg)  SpO2: 100%    General:  Obese, No respiratory difficulty HEENT: normal Neck: supple. no JVD 11-12. Carotids 2+ bilat; no bruits. No lymphadenopathy or thryomegaly appreciated. Cor: Distant.  PMI nondisplaced. Regular rate & rhythm. No rubs, gallops or murmurs. Lungs:CTA Abdomen: obese, soft, nontender, + distention. No hepatosplenomegaly. No bruits or masses. Good bowel sounds. Extremities: no cyanosis, clubbing, rash, 2+ woody edema Neuro: alert & oriented x 3, cranial  nerves grossly intact. moves all 4 extremities w/o difficulty. Affect pleasant.     ASSESSMENT & PLAN:

## 2012-01-26 NOTE — Assessment & Plan Note (Addendum)
Volume status elevated.  NYHA III.  Patient is almost 20 pounds elevated due to increased fluid intake of at least 2-3 liters per day.  I feel that lasix is not being absorbed well with abdominal distention.  Will give 3 days of metolazone to aid in diuresis.  If abdominal distention does not improve will consider changing lasix to demadex at follow up in 5-6 days.  He is unsure of his medications but after multiple phones calls to the pharmacy and his PCP he is not taking lisinopril or isordil, although they are on his home med list.  Will discontinue lisinopril as Cr has been trending around 2.  Will start imdur/hydralazine for afterload reduction.  The patient states he will go to the pharmacy today to pick up meds.  Have discussed with his pharmacy which meds he should pick up in order to ensure accuracy.  Have asked him to bring in all medication bottles next week.  Have also discussed need to restrict fluid to 2 liters or less per day.  He also should not be drinking sweet tea, he will try to cut back to 1 glass a day.  BMET today.    45 minutes spent with patient with greater than 50% of the time discussion medications, diet and fluid restrictions.

## 2012-01-26 NOTE — Patient Instructions (Addendum)
Metolazone 2.5 mg daily for 3 days.    Pick up isosorbide and hydralazine.    Labs today.  Follow up 1 week.  Do the following things EVERYDAY: 1) Weigh yourself in the morning before breakfast. Write it down and keep it in a log. 2) Take your medicines as prescribed 3) Eat low salt foods-Limit salt (sodium) to 2000 mg per day.  4) Stay as active as you can everyday 5) Limit all fluids for the day to less than 2 liters

## 2012-01-26 NOTE — Addendum Note (Signed)
Encounter addended by: Sharon Stapel M Harutyun Monteverde, RN on: 01/26/2012 12:56 PM<BR>     Documentation filed: Episodes

## 2012-01-26 NOTE — Assessment & Plan Note (Addendum)
Would like SBP <130, add hydralazine/imdur as above.

## 2012-01-26 NOTE — Assessment & Plan Note (Signed)
Follow Cr closely.  No ACE-I/ARB.

## 2012-01-26 NOTE — Progress Notes (Signed)
Referring Physician: Dr. Graciela Husbands Primary Care: Kallie Edward Primary Cardiologist: Dr. Clifton James  Weight Range: 277-280 lbs (does not weigh daily)  HPI: [name] is a 76 yo AAM with history of HTN, NICM (EF 30-35%), chronic systolic heart failure, and CAD. He was referred today by Dr. Graciela Husbands. He is a very pleasant gentleman who currently drives a bus for [employer] and was previously a Runner, broadcasting/film/video.  He started noticing LEE last year and started to be seen by Dr. Clifton James. He does report exertional dyspnea, however does not believe it has gotten worse over the past year. He lives with his wife who says that he does snore occasionally. He does not weigh himself daily and is not really sure about why his weight fluctuates.  Cath 5/12:  1) Nonobstructive coronary artery disease in the major epicardial vessels.  2. Severe subtotal occlusion in a very small-caliber posterior descending artery branch.        This vessel is too small for intervention. The vessel was approximately a 1.5-1.75-mm       vessel.  ECHO 12/03/11: The estimated ejection fraction was in the range of 25% to 30%. Diffuse hypokinesis. There is severe hypokinesis of the entireinferior myocardium.  Dr. Graciela Husbands started patient on Lasix a couple weeks ago and the patient has noticed a decrease in his LE edema. He denies any CP/orthopnea.   Review of Systems: [y] = yes, [ ]  = no   General: Weight gain [ Y]; Weight loss [ ] ; Anorexia [ ] ; Fatigue [ ] ; Fever [ ] ; Chills [ ] ; Weakness [ N]  Cardiac: Chest pain/pressure Klaus.Mock ]; Resting SOB Klaus.Mock ]; Exertional SOB [Y ]; Myer Peer ]; Pedal Edema [ Y]; Palpitations [ ] ; Syncope [ ] ; Presyncope [ ] ; Paroxysmal nocturnal dyspnea[ ]   Pulmonary: Cough Klaus.Mock ]; Wheezing[ ] ; Hemoptysis[ ] ; Sputum [ ] ; Snoring [Y ]  GI: Vomiting[ ] ; Dysphagia[ ] ; Melena[ ] ; Hematochezia [ ] ; Heartburn[N ]; Abdominal pain [ ] ; Constipation [ ] ; Diarrhea [ ] ; BRBPR [ ]   GU: Hematuria[ ] ; Dysuria [ ] ; Nocturia[ ]   Vascular:  Pain in legs with walking [N ]; Pain in feet with lying flat [ ] ; Non-healing sores [ ] ; Stroke [ ] ; TIA [ ] ; Slurred speech [ ] ;  Neuro: Headaches[N ]; Vertigo[ ] ; Seizures[ ] ; Paresthesias[ ] ;Blurred vision [ ] ; Diplopia [ ] ; Vision changes [ ]   Ortho/Skin: Arthritis [ ] ; Joint pain [ ] ; Muscle pain [ ] ; Joint swelling [ ] ; Back Pain [ ] ; Rash [ ]   Psych: Depression[ ] ; Anxiety[ ]   Heme: Bleeding problems [ ] ; Clotting disorders [ ] ; Anemia [ ]   Endocrine: Diabetes [ N]; Thyroid dysfunction[ ]    Past Medical History  Diagnosis Date  . Hypertension   . Diabetes mellitus   . Cardiomyopathy, nonischemic   . CAD (coronary artery disease)     Cardiac cath May 2012 with mild plaque LAD and Circumflex and severe disease in  very small PDA branch of RCA.     Current Outpatient Prescriptions  Medication Sig Dispense Refill  . albuterol (VENTOLIN HFA) 108 (90 BASE) MCG/ACT inhaler Inhale 2 puffs into the lungs every 4 (four) hours as needed.      Marland Kitchen aspirin 81 MG tablet Take 81 mg by mouth daily.      Marland Kitchen atorvastatin (LIPITOR) 20 MG tablet Take 20 mg by mouth daily.      . carvedilol (COREG) 3.125 MG tablet Take 1 tablet (3.125 mg total) by mouth 2 (two)  times daily with a meal.      . ferrous sulfate 325 (65 FE) MG tablet Take 325 mg by mouth daily with breakfast.      . furosemide (LASIX) 20 MG tablet Take 2 tablets (40 mg total) by mouth 2 (two) times daily.  60 tablet  6  . KLOR-CON M20 20 MEQ tablet TAKE 1 TABLET BY MOUTH DAILY  30 tablet  6  . metFORMIN (GLUCOPHAGE) 500 MG tablet Take 500 mg by mouth 2 (two) times daily with a meal.        . glimepiride (AMARYL) 2 MG tablet Take 0.5 tablets (1 mg total) by mouth daily before breakfast.      . hydrALAZINE (APRESOLINE) 25 MG tablet Take 0.5 tablets (12.5 mg total) by mouth 3 (three) times daily.  45 tablet  1  . isosorbide mononitrate (IMDUR) 30 MG 24 hr tablet Take 1 tablet (30 mg total) by mouth daily.  30 tablet  6  . spironolactone  (ALDACTONE) 25 MG tablet Take 1 tablet (25 mg total) by mouth daily.  30 tablet  3  . DISCONTD: glimepiride (AMARYL) 2 MG tablet Take 2 mg by mouth daily before breakfast.        No Known Allergies  History   Social History  . Marital Status: Married    Spouse Name: N/A    Number of Children: 1  . Years of Education: N/A   Occupational History  . driver     [employer]   Social History Main Topics  . Smoking status: Never Smoker   . Smokeless tobacco: Not on file  . Alcohol Use: No  . Drug Use: No  . Sexually Active: Not on file   Other Topics Concern  . Not on file   Social History Narrative   Driver for [employer].    No family history on file.  PHYSICAL EXAM: Filed Vitals:   01/10/12 1437  BP: 159/90  Pulse: 82  Height: 5\' 10"  (1.778 m)  Weight: 281 lb 6.4 oz (127.642 kg)  SpO2: 100%    General:  Obese, No respiratory difficulty HEENT: normal Neck: supple. no JVD 8-9. Carotids 2+ bilat; no bruits. No lymphadenopathy or thryomegaly appreciated. Cor: PMI nondisplaced. Regular rate & rhythm. No rubs, gallops or murmurs. Lungs:CTA Abdomen: soft, nontender, nondistended. No hepatosplenomegaly. No bruits or masses. Good bowel sounds. Extremities: no cyanosis, clubbing, rash, 1-2+ woody edema Neuro: alert & oriented x 3, cranial nerves grossly intact. moves all 4 extremities w/o difficulty. Affect pleasant.     ASSESSMENT & PLAN:

## 2012-01-31 ENCOUNTER — Encounter (HOSPITAL_COMMUNITY): Payer: Medicare Other

## 2012-02-08 ENCOUNTER — Ambulatory Visit (HOSPITAL_COMMUNITY): Payer: Medicare Other | Attending: Internal Medicine

## 2012-04-03 ENCOUNTER — Other Ambulatory Visit: Payer: Self-pay | Admitting: Physician Assistant

## 2012-04-09 ENCOUNTER — Encounter (HOSPITAL_COMMUNITY): Payer: Self-pay | Admitting: Emergency Medicine

## 2012-04-09 ENCOUNTER — Inpatient Hospital Stay (HOSPITAL_COMMUNITY)
Admission: EM | Admit: 2012-04-09 | Discharge: 2012-04-18 | DRG: 292 | Disposition: A | Discharge status: Home Health | Payer: Medicare Other | Attending: Internal Medicine | Admitting: Internal Medicine

## 2012-04-09 ENCOUNTER — Emergency Department (HOSPITAL_COMMUNITY): Payer: Medicare Other

## 2012-04-09 DIAGNOSIS — I5043 Acute on chronic combined systolic (congestive) and diastolic (congestive) heart failure: Principal | ICD-10-CM

## 2012-04-09 DIAGNOSIS — I10 Essential (primary) hypertension: Secondary | ICD-10-CM

## 2012-04-09 DIAGNOSIS — I509 Heart failure, unspecified: Secondary | ICD-10-CM

## 2012-04-09 DIAGNOSIS — E66813 Obesity, class 3: Secondary | ICD-10-CM | POA: Diagnosis present

## 2012-04-09 DIAGNOSIS — I5022 Chronic systolic (congestive) heart failure: Secondary | ICD-10-CM

## 2012-04-09 DIAGNOSIS — N135 Crossing vessel and stricture of ureter without hydronephrosis: Secondary | ICD-10-CM | POA: Diagnosis present

## 2012-04-09 DIAGNOSIS — N1330 Unspecified hydronephrosis: Secondary | ICD-10-CM

## 2012-04-09 DIAGNOSIS — E119 Type 2 diabetes mellitus without complications: Secondary | ICD-10-CM

## 2012-04-09 DIAGNOSIS — N179 Acute kidney failure, unspecified: Secondary | ICD-10-CM

## 2012-04-09 DIAGNOSIS — E162 Hypoglycemia, unspecified: Secondary | ICD-10-CM

## 2012-04-09 DIAGNOSIS — I129 Hypertensive chronic kidney disease with stage 1 through stage 4 chronic kidney disease, or unspecified chronic kidney disease: Secondary | ICD-10-CM | POA: Diagnosis present

## 2012-04-09 DIAGNOSIS — D649 Anemia, unspecified: Secondary | ICD-10-CM

## 2012-04-09 DIAGNOSIS — E1169 Type 2 diabetes mellitus with other specified complication: Secondary | ICD-10-CM | POA: Diagnosis present

## 2012-04-09 DIAGNOSIS — K760 Fatty (change of) liver, not elsewhere classified: Secondary | ICD-10-CM | POA: Diagnosis present

## 2012-04-09 DIAGNOSIS — N183 Chronic kidney disease, stage 3 unspecified: Secondary | ICD-10-CM

## 2012-04-09 DIAGNOSIS — N184 Chronic kidney disease, stage 4 (severe): Secondary | ICD-10-CM | POA: Diagnosis present

## 2012-04-09 DIAGNOSIS — K7689 Other specified diseases of liver: Secondary | ICD-10-CM | POA: Diagnosis present

## 2012-04-09 DIAGNOSIS — Z6841 Body Mass Index (BMI) 40.0 and over, adult: Secondary | ICD-10-CM

## 2012-04-09 DIAGNOSIS — E875 Hyperkalemia: Secondary | ICD-10-CM | POA: Diagnosis present

## 2012-04-09 HISTORY — DX: Acute on chronic combined systolic (congestive) and diastolic (congestive) heart failure: I50.43

## 2012-04-09 LAB — GLUCOSE, CAPILLARY
Glucose-Capillary: 54 mg/dL — ABNORMAL LOW (ref 70–99)
Glucose-Capillary: 36 mg/dL — CL (ref 70–99)
Glucose-Capillary: 57 mg/dL — ABNORMAL LOW (ref 70–99)
Glucose-Capillary: 33 mg/dL — CL (ref 70–99)

## 2012-04-09 LAB — COMPREHENSIVE METABOLIC PANEL
Alkaline Phosphatase: 272 U/L — ABNORMAL HIGH (ref 39–117)
BUN: 45 mg/dL — ABNORMAL HIGH (ref 6–23)
Chloride: 109 mEq/L (ref 96–112)
GFR calc Af Amer: 31 mL/min — ABNORMAL LOW (ref >90)
Glucose, Bld: 43 mg/dL — CL (ref 70–99)
Potassium: 5.1 mEq/L (ref 3.5–5.1)
Total Bilirubin: 0.4 mg/dL (ref 0.3–1.2)

## 2012-04-09 LAB — CBC
MCV: 86.4 fL (ref 78.0–100.0)
Platelets: 260 10*3/uL (ref 150–400)
RDW: 15.8 % — ABNORMAL HIGH (ref 11.5–15.5)
WBC: 4.5 10*3/uL (ref 4.0–10.5)

## 2012-04-09 LAB — URINALYSIS, ROUTINE W REFLEX MICROSCOPIC
Ketones, ur: NEGATIVE mg/dL
Protein, ur: >300 mg/dL — AB
Urobilinogen, UA: 1 mg/dL (ref 0.0–1.0)

## 2012-04-09 LAB — URINE MICROSCOPIC-ADD ON

## 2012-04-09 LAB — LACTIC ACID, PLASMA: Lactic Acid, Venous: 0.9 mmol/L (ref 0.5–2.2)

## 2012-04-09 MED ORDER — ATORVASTATIN CALCIUM 20 MG PO TABS
20.0000 mg | ORAL_TABLET | Freq: Every day | ORAL | Status: DC
  Administered 2012-04-10 – 2012-04-18 (×9): 20 mg via ORAL
  Filled 2012-04-09 (×9): qty 1

## 2012-04-09 MED ORDER — GLUCOSE-VITAMIN C 4-6 GM-MG PO CHEW
CHEWABLE_TABLET | ORAL | Status: AC
  Filled 2012-04-09: qty 1

## 2012-04-09 MED ORDER — FERROUS SULFATE 325 (65 FE) MG PO TABS
325.0000 mg | ORAL_TABLET | Freq: Every day | ORAL | Status: DC
  Administered 2012-04-10 – 2012-04-18 (×8): 325 mg via ORAL
  Filled 2012-04-09 (×10): qty 1

## 2012-04-09 MED ORDER — ACETAMINOPHEN 650 MG RE SUPP: 650.0000 mg | Freq: Four times a day (QID) | RECTAL | Status: DC | PRN

## 2012-04-09 MED ORDER — ISOSORBIDE MONONITRATE ER 30 MG PO TB24
30.0000 mg | ORAL_TABLET | Freq: Every day | ORAL | Status: DC
  Administered 2012-04-10 – 2012-04-18 (×9): 30 mg via ORAL
  Filled 2012-04-09 (×9): qty 1

## 2012-04-09 MED ORDER — DEXTROSE 50 % IV SOLN
1.0000 | Freq: Once | INTRAVENOUS | Status: AC
  Administered 2012-04-09: 50 mL via INTRAVENOUS
  Filled 2012-04-09: qty 50

## 2012-04-09 MED ORDER — DEXTROSE 50 % IV SOLN
50.0000 mL | Freq: Once | INTRAVENOUS | Status: AC
  Administered 2012-04-09: 25 mL via INTRAVENOUS
  Filled 2012-04-09: qty 50

## 2012-04-09 MED ORDER — OXYCODONE HCL 5 MG PO TABS: 5.0000 mg | ORAL_TABLET | ORAL | Status: DC | PRN

## 2012-04-09 MED ORDER — ASPIRIN EC 81 MG PO TBEC
81.0000 mg | DELAYED_RELEASE_TABLET | Freq: Every day | ORAL | Status: DC
  Administered 2012-04-10 – 2012-04-18 (×9): 81 mg via ORAL
  Filled 2012-04-09 (×9): qty 1

## 2012-04-09 MED ORDER — SENNOSIDES-DOCUSATE SODIUM 8.6-50 MG PO TABS
1.0000 | ORAL_TABLET | Freq: Every evening | ORAL | Status: DC | PRN
  Filled 2012-04-09: qty 1

## 2012-04-09 MED ORDER — GLUCOSE 4 G PO CHEW
1.0000 | CHEWABLE_TABLET | ORAL | Status: DC | PRN
  Administered 2012-04-09 – 2012-04-11 (×13): 4 g via ORAL
  Filled 2012-04-09 (×3): qty 1

## 2012-04-09 MED ORDER — FUROSEMIDE 10 MG/ML IJ SOLN
80.0000 mg | Freq: Two times a day (BID) | INTRAMUSCULAR | Status: DC
  Administered 2012-04-09: 80 mg via INTRAVENOUS
  Filled 2012-04-09 (×4): qty 8

## 2012-04-09 MED ORDER — GLUCOSE-VITAMIN C 4-6 GM-MG PO CHEW
CHEWABLE_TABLET | ORAL | Status: AC
  Filled 2012-04-09: qty 3

## 2012-04-09 MED ORDER — SODIUM CHLORIDE 0.9 % IV SOLN: 250.0000 mL | INTRAVENOUS | Status: DC | PRN

## 2012-04-09 MED ORDER — INSULIN ASPART 100 UNIT/ML ~~LOC~~ SOLN: 0.0000 [IU] | Freq: Three times a day (TID) | SUBCUTANEOUS | Status: DC

## 2012-04-09 MED ORDER — SODIUM CHLORIDE 0.9 % IJ SOLN: 3.0000 mL | Freq: Two times a day (BID) | INTRAMUSCULAR | Status: DC

## 2012-04-09 MED ORDER — HYDRALAZINE HCL 25 MG PO TABS
12.5000 mg | ORAL_TABLET | Freq: Three times a day (TID) | ORAL | Status: DC
  Administered 2012-04-09 – 2012-04-10 (×3): 12.5 mg via ORAL
  Filled 2012-04-09 (×5): qty 0.5

## 2012-04-09 MED ORDER — DEXTROSE 10 % IV SOLN: 5.0000 mL/kg/h | Freq: Once | INTRAVENOUS | Status: DC

## 2012-04-09 MED ORDER — CARVEDILOL 3.125 MG PO TABS
3.1250 mg | ORAL_TABLET | Freq: Two times a day (BID) | ORAL | Status: DC
  Administered 2012-04-09 – 2012-04-17 (×16): 3.125 mg via ORAL
  Filled 2012-04-09 (×18): qty 1

## 2012-04-09 MED ORDER — ONDANSETRON HCL 4 MG/2ML IJ SOLN
4.0000 mg | Freq: Four times a day (QID) | INTRAMUSCULAR | Status: DC | PRN
  Administered 2012-04-17: 4 mg via INTRAVENOUS
  Filled 2012-04-09: qty 2

## 2012-04-09 MED ORDER — DEXTROSE 50 % IV SOLN
50.0000 mL | Freq: Once | INTRAVENOUS | Status: AC | PRN
  Administered 2012-04-09: 50 mL via INTRAVENOUS
  Filled 2012-04-09 (×2): qty 50

## 2012-04-09 MED ORDER — ALBUTEROL SULFATE HFA 108 (90 BASE) MCG/ACT IN AERS
2.0000 | INHALATION_SPRAY | RESPIRATORY_TRACT | Status: DC | PRN
  Administered 2012-04-10 (×2): 2 via RESPIRATORY_TRACT
  Filled 2012-04-09 (×2): qty 6.7

## 2012-04-09 MED ORDER — ACETAMINOPHEN 325 MG PO TABS: 650.0000 mg | ORAL_TABLET | Freq: Four times a day (QID) | ORAL | Status: DC | PRN

## 2012-04-09 MED ORDER — ONDANSETRON HCL 4 MG PO TABS: 4.0000 mg | ORAL_TABLET | Freq: Four times a day (QID) | ORAL | Status: DC | PRN

## 2012-04-09 MED ORDER — SODIUM CHLORIDE 0.9 % IJ SOLN: 3.0000 mL | INTRAMUSCULAR | Status: DC | PRN

## 2012-04-09 MED ORDER — SPIRONOLACTONE 25 MG PO TABS
25.0000 mg | ORAL_TABLET | Freq: Every day | ORAL | Status: DC
  Administered 2012-04-10: 25 mg via ORAL
  Filled 2012-04-09: qty 1

## 2012-04-09 MED ORDER — ENOXAPARIN SODIUM 40 MG/0.4ML ~~LOC~~ SOLN
40.0000 mg | SUBCUTANEOUS | Status: DC
  Administered 2012-04-09: 40 mg via SUBCUTANEOUS
  Filled 2012-04-09 (×2): qty 0.4

## 2012-04-09 MED ORDER — SODIUM CHLORIDE 0.9 % IJ SOLN
3.0000 mL | Freq: Two times a day (BID) | INTRAMUSCULAR | Status: DC
  Administered 2012-04-10 – 2012-04-17 (×5): 3 mL via INTRAVENOUS

## 2012-04-09 NOTE — H&P (Signed)
[facility]          History and Physical    PCP:   [name], MD   Chief Complaint:  Low sugar, SOB  HPI: 76 y/o man with h/o combined CHF, DM, HTN presents with the above complaints. In the beginning of September became diaphoretic and shaky and was found to be hyperglycemic. His PCP had asked him to cut his Amaryl in half. When he refilled his latest prescription, about 2 weeks ago, he forgot to cut them in half and has been taking the entire pill. Yesterday, EMS was called to his house as he again became diaphoretic and confused. Was found to have a sugar in the 40s. Was given D50 but was not transported to the ED. This recurred again today. He has been persistently hypoglycemic in the ED despite being given 3 amps of D50. He has also been complaining of increased SOB since Friday (2 days PTA),  especially with exertion. He is found to have an elevated BNP as well as PVC on CXR. We have been asked to admit him for further evaluation and management.  Allergies:  No Known Allergies    Past Medical History  Diagnosis Date  . Hypertension   . Diabetes mellitus   . Cardiomyopathy, nonischemic   . CAD (coronary artery disease)     Cardiac cath May 2012 with mild plaque LAD and Circumflex and severe disease in  very small PDA branch of RCA.     History reviewed. No pertinent past surgical history.  Prior to Admission medications   Medication Sig Start Date End Date Taking? Authorizing Provider  albuterol (VENTOLIN HFA) 108 (90 BASE) MCG/ACT inhaler Inhale 2 puffs into the lungs every 4 (four) hours as needed. For shortness of breath.   Yes Historical Provider, MD  aspirin EC 81 MG tablet Take 81 mg by mouth daily.   Yes Historical Provider, MD  atorvastatin (LIPITOR) 20 MG tablet Take 20 mg by mouth daily.   Yes Historical Provider, MD  carvedilol (COREG) 3.125 MG tablet Take 1 tablet (3.125 mg total) by mouth 2 (two) times daily with a meal. 01/06/12 01/05/13 Yes  Duke Salvia, MD  ferrous sulfate 325 (65 FE) MG tablet Take 325 mg by mouth daily with breakfast.   Yes Historical Provider, MD  furosemide (LASIX) 20 MG tablet Take 2 tablets (40 mg total) by mouth 2 (two) times daily. 12/17/11  Yes Kathleene Hazel, MD  glimepiride (AMARYL) 2 MG tablet Take 0.5 tablets (1 mg total) by mouth daily before breakfast. 01/26/12  Yes [name], PA  hydrALAZINE (APRESOLINE) 25 MG tablet Take 0.5 tablets (12.5 mg total) by mouth 3 (three) times daily. 01/26/12 01/25/13 Yes [name], PA  isosorbide mononitrate (IMDUR) 30 MG 24 hr tablet Take 1 tablet (30 mg total) by mouth daily. 01/26/12 01/25/13 Yes [name], PA  metFORMIN (GLUCOPHAGE) 500 MG tablet Take 500 mg by mouth 2 (two) times daily with a meal.     Yes Historical Provider, MD  potassium chloride SA (K-DUR,KLOR-CON) 20 MEQ tablet Take 20 mEq by mouth daily.   Yes Historical Provider, MD  spironolactone (ALDACTONE) 25 MG tablet Take 1 tablet (25 mg total) by mouth daily. 01/10/12 01/09/13 Yes Aundria Rud, NP    Social History:  reports that he has never smoked. He does not have any smokeless tobacco history on file. He reports that he does not drink alcohol or use illicit drugs.  No  family history on file.  Review of Systems:  Constitutional: Denies fever, chills, appetite change and fatigue.  HEENT: Denies photophobia, eye pain, redness, hearing loss, ear pain, congestion, sore throat, rhinorrhea, sneezing, mouth sores, trouble swallowing, neck pain, neck stiffness and tinnitus.   Respiratory: Denies cough, chest tightness,  and wheezing.   Cardiovascular: Denies chest pain, palpitations. Gastrointestinal: Denies nausea, vomiting, abdominal pain, diarrhea, constipation, blood in stool and abdominal distention.  Genitourinary: Denies dysuria, urgency, frequency, hematuria, flank pain and difficulty urinating.  Musculoskeletal: Denies myalgias, back pain, joint swelling, arthralgias and  gait problem.  Skin: Denies pallor, rash and wound.  Neurological: Denies dizziness, seizures, syncope, weakness, light-headedness, numbness and headaches.  Hematological: Denies adenopathy. Easy bruising, personal or family bleeding history  Psychiatric/Behavioral: Denies suicidal ideation, mood changes, confusion, nervousness, sleep disturbance and agitation   Physical Exam: Blood pressure 158/103, pulse 89, temperature 97.9 F (36.6 C), resp. rate 18, height 5' 10.5" (1.791 m), weight 136.079 kg (300 lb), SpO2 99.00%. Gen: AAOx3, sitting at the side of the bed as he cannot lie down flat. HEENT: [place]/AT/PERRL/EOMI, moist mucous membranes. Neck: supple, no JVD, no LAD, no bruits, no goiter. CV: RRR, no M/R/G. Lungs: Mild bibasilar crackles. Abd: S/NT/ND/+BS/no masses or organomegaly noted. Ext: no C/C, 3+ pitting edema bilaterally. Neuro: grossly intact and non-focal. Skin: no rashes noted.  Labs on Admission:  Results for orders placed during the hospital encounter of 04/09/12 (from the past 48 hour(s))  GLUCOSE, CAPILLARY     Status: Abnormal   Collection Time   04/09/12 11:34 AM      Component Value Range Comment   Glucose-Capillary 51 (*) 70 - 99 mg/dL   COMPREHENSIVE METABOLIC PANEL     Status: Abnormal   Collection Time   04/09/12 11:46 AM      Component Value Range Comment   Sodium 140  135 - 145 mEq/L    Potassium 5.1  3.5 - 5.1 mEq/L    Chloride 109  96 - 112 mEq/L    CO2 21  19 - 32 mEq/L    Glucose, Bld 43 (*) 70 - 99 mg/dL    BUN 45 (*) 6 - 23 mg/dL    Creatinine, Ser 9.60 (*) 0.50 - 1.35 mg/dL    Calcium 8.8  8.4 - 45.4 mg/dL    Total Protein 7.2  6.0 - 8.3 g/dL    Albumin 3.4 (*) 3.5 - 5.2 g/dL    AST 21  0 - 37 U/L    ALT 23  0 - 53 U/L    Alkaline Phosphatase 272 (*) 39 - 117 U/L    Total Bilirubin 0.4  0.3 - 1.2 mg/dL    GFR calc non Af Amer 27 (*) >90 mL/min    GFR calc Af Amer 31 (*) >90 mL/min   CBC     Status: Abnormal   Collection Time   04/09/12  11:46 AM      Component Value Range Comment   WBC 4.5  4.0 - 10.5 K/uL    RBC 3.39 (*) 4.22 - 5.81 MIL/uL    Hemoglobin 9.5 (*) 13.0 - 17.0 g/dL    HCT 09.8 (*) 11.9 - 52.0 %    MCV 86.4  78.0 - 100.0 fL    MCH 28.0  26.0 - 34.0 pg    MCHC 32.4  30.0 - 36.0 g/dL    RDW 14.7 (*) 82.9 - 15.5 %    Platelets 260  150 - 400 K/uL  PRO B NATRIURETIC PEPTIDE     Status: Abnormal   Collection Time   04/09/12 11:46 AM      Component Value Range Comment   Pro B Natriuretic peptide (BNP) 14183.0 (*) 0 - 125 pg/mL   PROTIME-INR     Status: Normal   Collection Time   04/09/12 11:46 AM      Component Value Range Comment   Prothrombin Time 14.0  11.6 - 15.2 seconds    INR 1.09  0.00 - 1.49   LACTIC ACID, PLASMA     Status: Normal   Collection Time   04/09/12 11:55 AM      Component Value Range Comment   Lactic Acid, Venous 0.9  0.5 - 2.2 mmol/L   GLUCOSE, CAPILLARY     Status: Abnormal   Collection Time   04/09/12  1:28 PM      Component Value Range Comment   Glucose-Capillary 54 (*) 70 - 99 mg/dL     Radiological Exams on Admission: Dg Chest 2 View  04/09/2012  *RADIOLOGY REPORT*  Clinical Data: Chest pain.  CHEST - 2 VIEW  Comparison: None.  Findings: Mild cardiomegaly. Coarse and prominent perihilar interstitial markings without confluent airspace infiltrate or overt edema.  No effusion. Bridging osteophytes through the mid and lower thoracic spine.  IMPRESSION:  1.  Mild cardiomegaly with central pulmonary vascular congestion.   Original Report Authenticated By: Osa Craver, M.D.     Assessment/Plan Principal Problem:  *Hypoglycemia Active Problems:  HTN (hypertension)  CKD (chronic kidney disease) stage 3, GFR 30-59 ml/min  Acute on chronic combined systolic and diastolic CHF (congestive heart failure)  DM (diabetes mellitus)    Hypoglycemia -He has been taking double the dose of meds that he should have been taking. -Will check CBGs q 2 hours until we have 2  >100. -Will not start on a D10 drip as he also has acute CHF. -Will instead treat with D50 amps as needed. -Most recent CBG is 36 after the last D50. -He has since eaten and drunk OJ: RN will recheck CBG.  Acute Combined CHF -He seems to be noncompliant with diet and confused about his meds. -He is not weighing himself on a daily basis. -Lasix 40 BID. -Strict Is and Os, daily weights. -Most recent ECHO in 6/13 with an EF of 25-30%. -Continue BB, statin, ASA, imdur, spironolactone. -Not an ACE-I given CKD.  DM -Hypoglycemic. -Frequent CBG checks. -D50 as needed.  CKD Stage III-IV -At baseline. -Aim for negative fluid balance  HTN -Follow and adjust meds as needed.  DVT Prophylaxis -Lovenox.    Time Spent on Admission: 75 minutes.  Chaya Jan [facility] Pager: [phone] 04/09/2012, 3:05 PM

## 2012-04-09 NOTE — Progress Notes (Signed)
Pt remains hypoglycemic after receiving several amps of d50 today. Pt also given several 15 gm snacks between dextrose administration. Pt remains asymptomatic, will notify Lenny Pastel, NP regarding further treatment.

## 2012-04-09 NOTE — ED Notes (Signed)
Bed:WA23<BR> Expected date:<BR> Expected time:<BR> Means of arrival:<BR> Comments:<BR> EMS

## 2012-04-09 NOTE — ED Notes (Signed)
Called to give report to receiving rn, will call back to have pt transported when bed is clean.

## 2012-04-09 NOTE — ED Notes (Signed)
PER EMS- pt picked up from home with c/o hypoglycemia, CBG 45.  Pt has had problems with glucose level x2 days.  Reports that he's been eating and taking metformin as prescribed, but still having problems with CBG.  Alert and oriented x4.  Also reports respiratory distress on exertion.  Hx of CHF. No chest pain.

## 2012-04-09 NOTE — ED Provider Notes (Signed)
History     CSN: [id number]  Arrival date & time 04/09/12  1106   First MD Initiated Contact with Patient 04/09/12 1112      Chief Complaint  Patient presents with  . Hypoglycemia    (Consider location/radiation/quality/duration/timing/severity/associated sxs/prior treatment) HPI The patient presents with concerns of hypoglycemia.  He has no focal complaints, though he does endorse mild ongoing nausea.  He does note that he has not been taking his medication as prescribed.  Twice over the past 12 hours the patient has been noted to have a glucose of less than 50. He explicitly denies syncope, near syncope, emesis, diarrhea, abdominal pain. He does note that he has decreased exercise tolerance, but at rest he is asymptomatic.  Past Medical History  Diagnosis Date  . Hypertension   . Diabetes mellitus   . Cardiomyopathy, nonischemic   . CAD (coronary artery disease)     Cardiac cath May 2012 with mild plaque LAD and Circumflex and severe disease in  very small PDA branch of RCA.     History reviewed. No pertinent past surgical history.  No family history on file.  History  Substance Use Topics  . Smoking status: Never Smoker   . Smokeless tobacco: Not on file  . Alcohol Use: No      Review of Systems  Constitutional:       Per HPI, otherwise negative  HENT:       Per HPI, otherwise negative  Eyes: Negative.   Respiratory:       Per HPI, otherwise negative  Cardiovascular:       Per HPI, otherwise negative  Gastrointestinal: Positive for nausea. Negative for vomiting.  Genitourinary: Negative.   Musculoskeletal:       Per HPI, otherwise negative  Skin: Negative.   Neurological: Negative for syncope.    Allergies  Review of patient's allergies indicates no known allergies.  Home Medications   Current Outpatient Rx  Name Route Sig Dispense Refill  . ALBUTEROL SULFATE HFA 108 (90 BASE) MCG/ACT IN AERS Inhalation Inhale 2 puffs into the lungs every 4  (four) hours as needed. For shortness of breath.    . ASPIRIN EC 81 MG PO TBEC Oral Take 81 mg by mouth daily.    . ATORVASTATIN CALCIUM 20 MG PO TABS Oral Take 20 mg by mouth daily.    Marland Kitchen CARVEDILOL 3.125 MG PO TABS Oral Take 1 tablet (3.125 mg total) by mouth 2 (two) times daily with a meal.    . FERROUS SULFATE 325 (65 FE) MG PO TABS Oral Take 325 mg by mouth daily with breakfast.    . FUROSEMIDE 20 MG PO TABS Oral Take 2 tablets (40 mg total) by mouth 2 (two) times daily. 60 tablet 6  . GLIMEPIRIDE 2 MG PO TABS Oral Take 0.5 tablets (1 mg total) by mouth daily before breakfast.    . HYDRALAZINE HCL 25 MG PO TABS Oral Take 0.5 tablets (12.5 mg total) by mouth 3 (three) times daily. 45 tablet 1  . ISOSORBIDE MONONITRATE ER 30 MG PO TB24 Oral Take 1 tablet (30 mg total) by mouth daily. 30 tablet 6  . METFORMIN HCL 500 MG PO TABS Oral Take 500 mg by mouth 2 (two) times daily with a meal.      . POTASSIUM CHLORIDE CRYS ER 20 MEQ PO TBCR Oral Take 20 mEq by mouth daily.    Marland Kitchen SPIRONOLACTONE 25 MG PO TABS Oral Take 1 tablet (25 mg total) by  mouth daily. 30 tablet 3    BP 158/103  Pulse 89  Temp 97.9 F (36.6 C)  Resp 18  Ht 5' 10.5" (1.791 m)  Wt 300 lb (136.079 kg)  BMI 42.44 kg/m2  SpO2 99%  Physical Exam  Nursing note and vitals reviewed. Constitutional: He is oriented to person, place, and time. He appears well-developed. No distress.  HENT:  Head: Normocephalic and atraumatic.  Eyes: Conjunctivae normal and EOM are normal.  Cardiovascular: Normal rate and regular rhythm.   Pulmonary/Chest: Effort normal. No stridor. No respiratory distress.  Abdominal: He exhibits no distension.  Musculoskeletal: He exhibits no edema.  Neurological: He is alert and oriented to person, place, and time.  Skin: Skin is warm and dry.  Psychiatric: He has a normal mood and affect.    ED Course  Procedures (including critical care time)  Labs Reviewed  COMPREHENSIVE METABOLIC PANEL - Abnormal;  Notable for the following:    Glucose, Bld 43 (*)     BUN 45 (*)     Creatinine, Ser 2.28 (*)     Albumin 3.4 (*)     Alkaline Phosphatase 272 (*)     GFR calc non Af Amer 27 (*)     GFR calc Af Amer 31 (*)     All other components within normal limits  CBC - Abnormal; Notable for the following:    RBC 3.39 (*)     Hemoglobin 9.5 (*)     HCT 29.3 (*)     RDW 15.8 (*)     All other components within normal limits  PRO B NATRIURETIC PEPTIDE - Abnormal; Notable for the following:    Pro B Natriuretic peptide (BNP) 14183.0 (*)     All other components within normal limits  GLUCOSE, CAPILLARY - Abnormal; Notable for the following:    Glucose-Capillary 51 (*)     All other components within normal limits  LACTIC ACID, PLASMA  PROTIME-INR  URINALYSIS, ROUTINE W REFLEX MICROSCOPIC   Dg Chest 2 View  04/09/2012  *RADIOLOGY REPORT*  Clinical Data: Chest pain.  CHEST - 2 VIEW  Comparison: None.  Findings: Mild cardiomegaly. Coarse and prominent perihilar interstitial markings without confluent airspace infiltrate or overt edema.  No effusion. Bridging osteophytes through the mid and lower thoracic spine.  IMPRESSION:  1.  Mild cardiomegaly with central pulmonary vascular congestion.   Original Report Authenticated By: Osa Craver, M.D.     No diagnosis found.  With initial labs demonstrating a BG of <50, he received D50  MDM  This patient presents with concerns of persistent hypoglycemia.  On further questioning the patient also notes ongoing dyspnea, particularly with exertion.  He denies any pain, confusion, lightheadedness.  Given the patient's description of dyspnea, has no history of renal failure on the CHF, labs were conducted as well as x-ray.  Labs are notable for persistent hypoglycemia in spite of boluses of sugar.  The patient also had persistent renal dysfunction with elevation of his BNP.  Given these for resuscitation with fluids, but with concern for his renal  dysfunction, congestive heart failure, he will be admitted for further evaluation and management.      Gerhard Munch, MD 04/09/12 [phone]

## 2012-04-09 NOTE — Progress Notes (Signed)
Dr. Ardyth Harps via phone made aware Pt's CBG 33. Pt sitting in chair and voices no complaints. Dinner has been ordered and called to have food delivered now. VSS. Will moniter CBGs q2hrs and as needed as per orders. Maintain current plan of care.

## 2012-04-10 LAB — GLUCOSE, CAPILLARY
Glucose-Capillary: 28 mg/dL — CL (ref 70–99)
Glucose-Capillary: 39 mg/dL — CL (ref 70–99)
Glucose-Capillary: 44 mg/dL — CL (ref 70–99)
Glucose-Capillary: 46 mg/dL — ABNORMAL LOW (ref 70–99)
Glucose-Capillary: 51 mg/dL — ABNORMAL LOW (ref 70–99)
Glucose-Capillary: 55 mg/dL — ABNORMAL LOW (ref 70–99)
Glucose-Capillary: 57 mg/dL — ABNORMAL LOW (ref 70–99)
Glucose-Capillary: 73 mg/dL (ref 70–99)

## 2012-04-10 LAB — CBC
MCH: 27.8 pg (ref 26.0–34.0)
MCHC: 32.1 g/dL (ref 30.0–36.0)
Platelets: 219 10*3/uL (ref 150–400)

## 2012-04-10 LAB — BASIC METABOLIC PANEL
BUN: 45 mg/dL — ABNORMAL HIGH (ref 6–23)
Calcium: 8 mg/dL — ABNORMAL LOW (ref 8.4–10.5)
Creatinine, Ser: 2.53 mg/dL — ABNORMAL HIGH (ref 0.50–1.35)
GFR calc non Af Amer: 23 mL/min — ABNORMAL LOW (ref >90)
Glucose, Bld: 44 mg/dL — CL (ref 70–99)

## 2012-04-10 MED ORDER — DEXTROSE 50 % IV SOLN: 1.0000 | Freq: Once | INTRAVENOUS | Status: DC

## 2012-04-10 MED ORDER — FUROSEMIDE 10 MG/ML IJ SOLN
80.0000 mg | Freq: Three times a day (TID) | INTRAMUSCULAR | Status: DC
  Administered 2012-04-10 – 2012-04-11 (×3): 80 mg via INTRAVENOUS
  Filled 2012-04-10 (×6): qty 8

## 2012-04-10 MED ORDER — DEXTROSE 50 % IV SOLN
INTRAVENOUS | Status: AC
  Administered 2012-04-10: 50 mL
  Filled 2012-04-10: qty 50

## 2012-04-10 MED ORDER — SODIUM CHLORIDE 0.9 % IJ SOLN
10.0000 mL | Freq: Two times a day (BID) | INTRAMUSCULAR | Status: DC
  Administered 2012-04-10: 10 mL
  Administered 2012-04-11: 20 mL
  Administered 2012-04-11 – 2012-04-18 (×7): 10 mL

## 2012-04-10 MED ORDER — DEXTROSE 10 % IV SOLN
INTRAVENOUS | Status: DC
  Administered 2012-04-10: 20 mL via INTRAVENOUS
  Administered 2012-04-11 (×2): via INTRAVENOUS
  Filled 2012-04-10: qty 1000

## 2012-04-10 MED ORDER — GLUCOSE-VITAMIN C 4-6 GM-MG PO CHEW
CHEWABLE_TABLET | ORAL | Status: AC
  Filled 2012-04-10: qty 1

## 2012-04-10 MED ORDER — DEXTROSE 50 % IV SOLN
50.0000 mL | Freq: Once | INTRAVENOUS | Status: AC | PRN
  Administered 2012-04-10: 50 mL via INTRAVENOUS

## 2012-04-10 MED ORDER — DEXTROSE 50 % IV SOLN
50.0000 mL | Freq: Once | INTRAVENOUS | Status: AC | PRN
  Administered 2012-04-10: 50 mL via INTRAVENOUS
  Filled 2012-04-10: qty 50

## 2012-04-10 MED ORDER — HEPARIN SODIUM (PORCINE) 5000 UNIT/ML IJ SOLN
5000.0000 [IU] | Freq: Three times a day (TID) | INTRAMUSCULAR | Status: DC
  Administered 2012-04-10 – 2012-04-18 (×24): 5000 [IU] via SUBCUTANEOUS
  Filled 2012-04-10 (×29): qty 1

## 2012-04-10 MED ORDER — SODIUM CHLORIDE 0.9 % IJ SOLN
10.0000 mL | INTRAMUSCULAR | Status: DC | PRN
  Administered 2012-04-13: 10 mL
  Administered 2012-04-13: 20 mL
  Administered 2012-04-14 – 2012-04-18 (×5): 10 mL

## 2012-04-10 NOTE — Progress Notes (Signed)
Hypoglycemic Event  CBG: 62  Treatment: 3 glucose tabs and D50 IV 50 mL  Symptoms: None  Follow-up CBG: Time:2304 CBG Result:70  Possible Reasons for Event: Medication regimen:   Comments/MD notified:    Marcene Corning  Remember to initiate Hypoglycemia Order Set & complete

## 2012-04-10 NOTE — Progress Notes (Signed)
Hypoglycemic Event  CBG: 47           Treatment: D50 IV 50 mL  Symptoms: None  Follow-up CBG: Time:2035 CBG Result:62  Possible Reasons for Event: Medication regimen:   Comments/MD notified: 15gm carb snack given also    Marcene Corning  Remember to initiate Hypoglycemia Order Set & complete

## 2012-04-10 NOTE — Progress Notes (Signed)
Pt received from [place] [facility], Pt was alert, oriented, appear in no acute distress on arrival. Initially CBG on arrival was <70, the [facility] RNs administer 1 ampule of D50. Will continue to monitor.

## 2012-04-10 NOTE — Progress Notes (Signed)
Hypoglycemic Event  CBG:46  Treatment: 3 glucose tabs, 15 GM carbohydrate snack and D50 IV 50 mL  Symptoms: None  Follow-up CBG: Time:0455 CBG Result:73  Possible Reasons for Event: Medication regimen:   Comments/MD notified    Marcene Corning  Remember to initiate Hypoglycemia Order Set & complete

## 2012-04-10 NOTE — Progress Notes (Signed)
Hypoglycemic Event  CBG: 39 Treatment: 3 glucose tabs, 15 GM carbohydrate snack and D50 IV 50 mL  Symptoms: None  Follow-up CBG: Time:75 CBG Result:0544  Possible Reasons for Event: Medication regimen:   Comments/MD notified:    Marcene Corning  Remember to initiate Hypoglycemia Order Set & complete

## 2012-04-10 NOTE — Progress Notes (Signed)
Peripherally Inserted Central Catheter/Midline Placement  The IV Nurse has discussed with the patient and/or persons authorized to consent for the patient, the purpose of this procedure and the potential benefits and risks involved with this procedure.  The benefits include less needle sticks, lab draws from the catheter and patient may be discharged home with the catheter.  Risks include, but not limited to, infection, bleeding, blood clot (thrombus formation), and puncture of an artery; nerve damage and irregular heat beat.  Alternatives to this procedure were also discussed.  PICC/Midline Placement Documentation        Stacie Glaze Horton 04/10/2012, 3:18 PM

## 2012-04-10 NOTE — Progress Notes (Signed)
Inpatient Diabetes Program Recommendations  AACE/ADA: New Consensus Statement on Inpatient Glycemic Control (2013)  Target Ranges:  Prepandial:   less than 140 mg/dL      Peak postprandial:   less than 180 mg/dL (1-2 hours)      Critically ill patients:  140 - 180 mg/dL   Reason for Visit: Consult - Hypoglycemia  76 y/o man with h/o combined CHF, DM, HTN presents with the above complaints. In the beginning of September became diaphoretic and shaky and was found to be hyperglycemic. His PCP had asked him to cut his Amaryl in half. When he refilled his latest prescription, about 2 weeks ago, he forgot to cut them in half and has been taking the entire pill. Yesterday, EMS was called to his house as he again became diaphoretic and confused. Was found to have a sugar in the 40s. Was given D50 but was not transported to the ED. This recurred again today. He has been persistently hypoglycemic in the ED despite being given 3 amps of D50. He has also been complaining of increased SOB since Friday (2 days PTA), especially with exertion. He is found to have an elevated BNP as well as PVC on CXR. Admitted to [facility] for hypoglycemia. Has been asymptomatic, even at 53.    Preparing to transfer to Step-down Unit.  All hypoglycemics, with exception of Novolog sensitive tidwc, is on hold.  Pt ate 100% of breakfast.  Recommendations:  CBG checks Q 1 hours until blood sugar is consistently > 90 mg/dL. Encourage po intake with meals and snacks. Continue to follow hypoglycemia prot   Will continue to monitor closely.  Ailene Ards, RD, LDN, CDE Inpatient Diabetes Coordinator [phone]

## 2012-04-10 NOTE — Progress Notes (Signed)
Hypoglycemic Event  CBG: 28  Treatment: D50 IV 50 mL  Symptoms: Sweaty, decreased LOC  Follow-up CBG: Time:1100 CBG Result:84  Possible Reasons for Event: Medication regimen: Pt taking double amount of medication. Pt admit with hypoglycemia  Comments/MD notified:Dr. Kathe Mariner, Darinda [place] A  Remember to initiate Hypoglycemia Order Set & complete

## 2012-04-10 NOTE — Progress Notes (Signed)
Pt continues to have hypoglycemic events, requiring administration of D50, glucose  Tablets and 15 gm snack. Pt asymptomatic even when he dropped to 39. Lenny Pastel, NP updated, no changes in treatment plan.

## 2012-04-10 NOTE — Progress Notes (Signed)
PCP: [name], MD  Brief HPI: 76 y/o man with h/o combined CHF, DM, HTN presented with low blood sugar and shortness of breath. In the beginning of September became diaphoretic and shaky and was found to be hyperglycemic. His PCP had asked him to cut his Amaryl in half. When he refilled his latest prescription, about 2 weeks ago, he forgot to cut them in half and has been taking the entire pill. The day before admission, EMS was called to his house as he again became diaphoretic and confused. Was found to have a sugar in the 40s. Was given D50 but was not transported to the ED. This recurred again on the day of admission. He had been persistently hypoglycemic in the ED despite being given 3 amps of D50. He has also been complaining of increased SOB since Friday (2 days PTA), especially with exertion. He was found to have an elevated BNP as well as PVC on CXR.    Past medical history:  Past Medical History  Diagnosis Date  . Hypertension   . Diabetes mellitus   . Cardiomyopathy, nonischemic   . CAD (coronary artery disease)     Cardiac cath May 2012 with mild plaque LAD and Circumflex and severe disease in  very small PDA branch of RCA.    Consultants: None  Procedures: None  Subjective: Patient was lethargic with garbled speech. CBG was checked stat and it was 28. He was given D50 with improvement in mental status and speech. Admits to shortness of breath. No chest pain. Admits to weight gain over last 2 weeks.  Objective: Vital signs in last 24 hours: Temp:  [97.5 F (36.4 C)-98.3 F (36.8 C)] 97.5 F (36.4 C) (10/28 0540) Pulse Rate:  [80-104] 82  (10/28 1100) Resp:  [18-22] 22  (10/28 0540) BP: (120-170)/(59-103) 120/60 mmHg (10/28 1100) SpO2:  [99 %-100 %] 100 % (10/28 1100) Weight:  [136.079 kg (300 lb)-155.7 kg (343 lb 4.1 oz)] 155.7 kg (343 lb 4.1 oz) (10/28 0500) Weight change:  Last BM Date: 04/09/12  Intake/Output from previous day: 10/27 0701 - 10/28 0700 In:  720 [P.O.:720] Out: -  Intake/Output this shift: Total I/O In: 240 [P.O.:240] Out: -   General appearance: initially lethargic with garbled speech which improved after his blood sugar improved.  Head: Normocephalic, without obvious abnormality, atraumatic Resp: decreased air entry at the bases. No wheezing. No deficinte crackles. Cardio: regular rate and rhythm, S1, S2 normal, no murmur, click, rub or gallop GI: obese, NT, ND. BS present. No masses or organomegaly. Extremities: edema 2-3+ b/l LE Skin: was diaphoretic due to low blood sugar Neurologic: Grossly normal once blood sugar was corrected. No focal deficits.  Lab Results:  Basename 04/10/12 0514 04/09/12 1146  WBC 5.0 4.5  HGB 8.8* 9.5*  HCT 27.4* 29.3*  PLT 219 260   BMET  Basename 04/10/12 0514 04/09/12 1146  NA 142 140  K 5.0 5.1  CL 112 109  CO2 22 21  GLUCOSE 44* 43*  BUN 45* 45*  CREATININE 2.53* 2.28*  CALCIUM 8.0* 8.8  ALT -- 23    Studies/Results: Dg Chest 2 View  04/09/2012  *RADIOLOGY REPORT*  Clinical Data: Chest pain.  CHEST - 2 VIEW  Comparison: None.  Findings: Mild cardiomegaly. Coarse and prominent perihilar interstitial markings without confluent airspace infiltrate or overt edema.  No effusion. Bridging osteophytes through the mid and lower thoracic spine.  IMPRESSION:  1.  Mild cardiomegaly with central pulmonary vascular congestion.  Original Report Authenticated By: Osa Craver, M.D.     Medications:  Scheduled:   . aspirin EC  81 mg Oral Daily  . atorvastatin  20 mg Oral Daily  . carvedilol  3.125 mg Oral BID WC  . dextrose  1 ampule Intravenous Once  . dextrose  1 ampule Intravenous Once  . dextrose  1 ampule Intravenous Once  . dextrose  50 mL Intravenous Once  . ferrous sulfate  325 mg Oral Q breakfast  . furosemide  80 mg Intravenous Q8H  . glucose-Vitamin C      . glucose-Vitamin C      . glucose-Vitamin C      . glucose-Vitamin C      . heparin subcutaneous   5,000 Units Subcutaneous Q8H  . insulin aspart  0-9 Units Subcutaneous TID WC  . isosorbide mononitrate  30 mg Oral Daily  . sodium chloride  3 mL Intravenous Q12H  . DISCONTD: dextrose  5 mL/kg/hr Intravenous Once  . DISCONTD: enoxaparin (LOVENOX) injection  40 mg Subcutaneous Q24H  . DISCONTD: furosemide  80 mg Intravenous Q12H  . DISCONTD: hydrALAZINE  12.5 mg Oral TID  . DISCONTD: sodium chloride  3 mL Intravenous Q12H  . DISCONTD: spironolactone  25 mg Oral Daily   Continuous:   . dextrose     ZOX:[id number], acetaminophen, albuterol, dextrose, dextrose, dextrose, dextrose, dextrose, dextrose, glucose, ondansetron (ZOFRAN) IV, ondansetron, oxyCODONE, senna-docusate, DISCONTD: sodium chloride, DISCONTD: sodium chloride  Assessment/Plan:  Principal Problem:  *Hypoglycemia Active Problems:  HTN (hypertension)  CKD (chronic kidney disease) stage 3, GFR 30-59 ml/min  Acute on chronic combined systolic and diastolic CHF (congestive heart failure)  DM (diabetes mellitus)    Hypoglycemia  Secondary to overmedication along with renal insufficiency. He has been taking double the dose of meds that he should have been taking. Has been persistently hypoglycemic overnight. Was as low as 28 just a few minutes ago. Will start d10 infusion for now. Holding all glucose lowering agents.   Acute Combined CHF  He seems to be noncompliant with diet and confused about his meds. Increase Lasix to q8h for today. Strict Is and Os, daily weights. Most recent ECHO in 6/13 with an EF of 25-30%. Continue BB, statin, ASA, imdur. Hold spironolactone due to borderline K level. Not an ACE-I given CKD. During last visit with Cards ICD was discussed. May need to involve cardiology but will evaluate in 1-2 days.  DM  Hypoglycemic. See above. Check HBa1c  CKD Stage III-IV  Slight worsening in renal function. Will see response to Lasix. Aim for negative fluid balance. Condom catheter to monitor urine output.  May need to involve Nephrology if UO doesn't pick up or if renal function gets worse.  HTN  Follow and adjust meds as needed.   DVT Prophylaxis  Change to Heparin  Code Status Full Code  Disposition Due to persistent hypoglycemia and need for D10 along with acute CHF he will need closer monitoring. Will transfer to SDU.    LOS: 1 day   [facility]  [facility] Pager [phone] 04/10/2012, 11:09 AM

## 2012-04-10 NOTE — Progress Notes (Signed)
Paged MD on call regarding CBG's: 20:51 57 given 1 4mg  Glucose tab 21:41 49 "                                      " 22:07 51 "                                      " 22:34 44   Belia Heman, NP, entered order for 1 amp of D50. And continue checking CBG's q1hr.

## 2012-04-10 NOTE — Progress Notes (Signed)
PICC line was placed at 1600, D10% started immediately, recent CBG was 44 despite the D10% infusion. Pt given glucose tablet 4g and juice to drink. Dr. Jaquelyn Bitter notified and wanted to increase the D10% infusion rate as well as putting patient on a fluid restriction diet.

## 2012-04-10 NOTE — Progress Notes (Signed)
Hypoglycemic Event  CBG: 28  Treatment: D50 IV 50 mL  Symptoms: Sweaty, Shaky and Nervous/irritable  Follow-up CBG: Time:1155 CBG Result:78  Possible Reasons for Event: Medication regimen:   Comments/MD notified:Dr G Iven Finn, Cathlean Cower  Remember to initiate Hypoglycemia Order Set & complete

## 2012-04-10 NOTE — Progress Notes (Signed)
Hypoglycemic Event  CBG: 55  Treatment: 3 glucose tabs and D50 IV 50 mL  Symptoms: None  Follow-up CBG: Time:0130 CBG Result:81  Possible Reasons for Event: Medication regimen:   Comments/MD notified:    Marcene Corning  Remember to initiate Hypoglycemia Order Set & complete

## 2012-04-10 NOTE — Progress Notes (Signed)
Pt's CBG at this time 69. No changes noted at this present time. Will continue to moniter closely for any changes in Pt's condition.

## 2012-04-10 NOTE — Progress Notes (Signed)
Pt alert and oriented. Eating breakfast and voices no complaints at this time. Last CBG check was 77. Will moniter closely.

## 2012-04-11 ENCOUNTER — Inpatient Hospital Stay (HOSPITAL_COMMUNITY): Payer: Medicare Other

## 2012-04-11 DIAGNOSIS — N179 Acute kidney failure, unspecified: Secondary | ICD-10-CM

## 2012-04-11 DIAGNOSIS — K760 Fatty (change of) liver, not elsewhere classified: Secondary | ICD-10-CM | POA: Diagnosis present

## 2012-04-11 DIAGNOSIS — N189 Chronic kidney disease, unspecified: Secondary | ICD-10-CM

## 2012-04-11 DIAGNOSIS — D649 Anemia, unspecified: Secondary | ICD-10-CM

## 2012-04-11 LAB — BASIC METABOLIC PANEL
BUN: 50 mg/dL — ABNORMAL HIGH (ref 6–23)
BUN: 48 mg/dL — ABNORMAL HIGH (ref 6–23)
CO2: 20 mEq/L (ref 19–32)
CO2: 20 mEq/L (ref 19–32)
Calcium: 8.1 mg/dL — ABNORMAL LOW (ref 8.4–10.5)
Chloride: 105 mEq/L (ref 96–112)
Creatinine, Ser: 2.86 mg/dL — ABNORMAL HIGH (ref 0.50–1.35)
Creatinine, Ser: 2.77 mg/dL — ABNORMAL HIGH (ref 0.50–1.35)
Glucose, Bld: 67 mg/dL — ABNORMAL LOW (ref 70–99)
Glucose, Bld: 365 mg/dL — ABNORMAL HIGH (ref 70–99)
Sodium: 132 mEq/L — ABNORMAL LOW (ref 135–145)

## 2012-04-11 LAB — CBC
HCT: 26.8 % — ABNORMAL LOW (ref 39.0–52.0)
Hemoglobin: 8.6 g/dL — ABNORMAL LOW (ref 13.0–17.0)
MCV: 86.2 fL (ref 78.0–100.0)
RBC: 3.11 MIL/uL — ABNORMAL LOW (ref 4.22–5.81)
WBC: 4.4 10*3/uL (ref 4.0–10.5)

## 2012-04-11 LAB — URINE MICROSCOPIC-ADD ON

## 2012-04-11 LAB — GLUCOSE, CAPILLARY
Glucose-Capillary: 70 mg/dL (ref 70–99)
Glucose-Capillary: 65 mg/dL — ABNORMAL LOW (ref 70–99)
Glucose-Capillary: 67 mg/dL — ABNORMAL LOW (ref 70–99)
Glucose-Capillary: 60 mg/dL — ABNORMAL LOW (ref 70–99)
Glucose-Capillary: 132 mg/dL — ABNORMAL HIGH (ref 70–99)
Glucose-Capillary: 126 mg/dL — ABNORMAL HIGH (ref 70–99)
Glucose-Capillary: 174 mg/dL — ABNORMAL HIGH (ref 70–99)

## 2012-04-11 LAB — URINALYSIS, ROUTINE W REFLEX MICROSCOPIC
Protein, ur: 30 mg/dL — AB
Urobilinogen, UA: 0.2 mg/dL (ref 0.0–1.0)

## 2012-04-11 LAB — LACTATE DEHYDROGENASE: LDH: 182 U/L (ref 94–250)

## 2012-04-11 LAB — PROTIME-INR
INR: 1.13 (ref 0.00–1.49)
Prothrombin Time: 14.3 seconds (ref 11.6–15.2)

## 2012-04-11 MED ORDER — ALTEPLASE 100 MG IV SOLR
2.0000 mg | Freq: Once | INTRAVENOUS | Status: AC
  Administered 2012-04-11: 2 mg
  Filled 2012-04-11: qty 2

## 2012-04-11 MED ORDER — FUROSEMIDE 10 MG/ML IJ SOLN
120.0000 mg | Freq: Three times a day (TID) | INTRAVENOUS | Status: DC
  Administered 2012-04-11 – 2012-04-14 (×8): 120 mg via INTRAVENOUS
  Filled 2012-04-11 (×13): qty 12

## 2012-04-11 MED ORDER — SODIUM POLYSTYRENE SULFONATE 15 GM/60ML PO SUSP
30.0000 g | Freq: Once | ORAL | Status: AC
  Administered 2012-04-11: 30 g via ORAL
  Filled 2012-04-11: qty 120

## 2012-04-11 NOTE — Progress Notes (Signed)
PCP: [name], MD  Brief HPI: 76 y/o man with h/o combined CHF, DM, HTN presented with low blood sugar and shortness of breath. In the beginning of September became diaphoretic and shaky and was found to be hyperglycemic. His PCP had asked him to cut his Amaryl in half. When he refilled his latest prescription, about 2 weeks ago, he forgot to cut them in half and has been taking the entire pill. The day before admission, EMS was called to his house as he again became diaphoretic and confused. Was found to have a sugar in the 40s. Was given D50 but was not transported to the ED. This recurred again on the day of admission. He had been persistently hypoglycemic in the ED despite being given 3 amps of D50. He has also been complaining of increased SOB since Friday (2 days PTA), especially with exertion. He was found to have an elevated BNP as well as PVC on CXR.    Past medical history:  Past Medical History  Diagnosis Date  . Hypertension   . Diabetes mellitus   . Cardiomyopathy, nonischemic   . CAD (coronary artery disease)     Cardiac cath May 2012 with mild plaque LAD and Circumflex and severe disease in  very small PDA branch of RCA.    Consultants: None  Procedures: None  Subjective: Patient feels much better this morning. No problems overnight. Denies shortness of breath. No chest pain.  Objective: Vital signs in last 24 hours: Temp:  [97.7 F (36.5 C)-100.5 F (38.1 C)] 100.5 F (38.1 C) (10/29 0400) Pulse Rate:  [77-95] 87  (10/29 0700) Resp:  [15-22] 22  (10/29 0700) BP: (103-155)/(47-76) 150/70 mmHg (10/29 0700) SpO2:  [97 %-100 %] 100 % (10/29 0700) Weight:  [69 kg (152 lb 1.9 oz)-155.4 kg (342 lb 9.5 oz)] 69 kg (152 lb 1.9 oz) (10/29 0400) Weight change: 19.321 kg (42 lb 9.5 oz) Last BM Date: 04/09/12  Intake/Output from previous day: 10/28 0701 - 10/29 0700 In: 1843.7 [P.O.:1240; I.V.:579.7; IV Piggyback:24] Out: 1210 [Urine:1210] Intake/Output this  shift: Total I/O In: 280 [P.O.:240; I.V.:40] Out: 550 [Urine:550]  General appearance: Awake, alert, no distress Head: Normocephalic, without obvious abnormality, atraumatic Resp: decreased air entry at the bases. No wheezing. No deficinte crackles. Cardio: regular rate and rhythm, S1, S2 normal, no murmur, click, rub or gallop GI: obese, NT, ND. BS present. No masses or organomegaly. Extremities: edema 2-3+ b/l LE Skin: was diaphoretic due to low blood sugar Neurologic: Alert and oriented x 3. No focal deficits.  Lab Results:  Basename 04/11/12 0320 04/10/12 0514  WBC 4.4 5.0  HGB 8.6* 8.8*  HCT 26.8* 27.4*  PLT 224 219   BMET  Basename 04/11/12 0320 04/10/12 0514 04/09/12 1146  NA 134* 142 --  K 5.2* 5.0 --  CL 105 112 --  CO2 20 22 --  GLUCOSE 67* 44* --  BUN 50* 45* --  CREATININE 2.86* 2.53* --  CALCIUM 8.0* 8.0* --  ALT -- -- 23    Studies/Results: Dg Chest 2 View  04/09/2012  *RADIOLOGY REPORT*  Clinical Data: Chest pain.  CHEST - 2 VIEW  Comparison: None.  Findings: Mild cardiomegaly. Coarse and prominent perihilar interstitial markings without confluent airspace infiltrate or overt edema.  No effusion. Bridging osteophytes through the mid and lower thoracic spine.  IMPRESSION:  1.  Mild cardiomegaly with central pulmonary vascular congestion.   Original Report Authenticated By: Osa Craver, M.D.  Medications:  Scheduled:    . aspirin EC  81 mg Oral Daily  . atorvastatin  20 mg Oral Daily  . carvedilol  3.125 mg Oral BID WC  . dextrose  1 ampule Intravenous Once  . dextrose      . dextrose      . ferrous sulfate  325 mg Oral Q breakfast  . furosemide  80 mg Intravenous Q8H  . glucose-Vitamin C      . glucose-Vitamin C      . glucose-Vitamin C      . glucose-Vitamin C      . heparin subcutaneous  5,000 Units Subcutaneous Q8H  . isosorbide mononitrate  30 mg Oral Daily  . sodium chloride  10-40 mL Intracatheter Q12H  . sodium chloride  3  mL Intravenous Q12H  . DISCONTD: enoxaparin (LOVENOX) injection  40 mg Subcutaneous Q24H  . DISCONTD: furosemide  80 mg Intravenous Q12H  . DISCONTD: hydrALAZINE  12.5 mg Oral TID  . DISCONTD: insulin aspart  0-9 Units Subcutaneous TID WC  . DISCONTD: sodium chloride  3 mL Intravenous Q12H  . DISCONTD: spironolactone  25 mg Oral Daily   Continuous:    . dextrose 40 mL/hr at 04/10/12 1801   ZOX:[id number], acetaminophen, albuterol, dextrose, dextrose, glucose, ondansetron (ZOFRAN) IV, ondansetron, oxyCODONE, senna-docusate, sodium chloride, DISCONTD: sodium chloride, DISCONTD: sodium chloride  Assessment/Plan:  Principal Problem:  *Hypoglycemia Active Problems:  Chronic systolic congestive heart failure, NYHA class 2  HTN (hypertension)  CKD (chronic kidney disease) stage 3, GFR 30-59 ml/min  Acute on chronic combined systolic and diastolic CHF (congestive heart failure)  DM (diabetes mellitus)    Hypoglycemia  Secondary to overmedication along with renal insufficiency. He has been taking double the dose of meds that he should have been taking. Continues to be persistently hypoglycemic but CBG this AM is 108. Will wait for Amaryl to wash out. Continue D10 drip. Holding all glucose lowering agents.   Acute Combined CHF  He seems to be noncompliant with diet and confused about his meds. Increased Lasix to q8h on 10/28. Continue for now. UO improved but remains in positive balance. Fluid restriction in place as well. Strict Is and Os, daily weights. Most recent ECHO in 6/13 with an EF of 25-30%. Continue BB, statin, ASA, imdur. Hold spironolactone due to borderline K level. Not an ACE-I given CKD. During last visit with Cards ICD was discussed. May need to involve cardiology but not urgent at this time.   Acute on CKD Stage III-IV with Hyperkalemia Creatinine continues to get worse. Continue IV Laisx. Consult Nephrology for assistance with management. Discussed with Dr. Abel Presto.  Aim for negative fluid balance. Strict I/O. Condom catheter to monitor urine output if needed. Give Kayexalate. Repeat K level later today.  DM  Hypoglycemic. See above. HBa1c is 5.6.  Anemia due to CKD No overt bleeding. Check Anemia Panel.  HTN  Follow and adjust meds as needed.   DVT Prophylaxis  Change to Heparin  Code Status Full Code  Disposition Keep in SDU for now. Mobilize as tolerated. PT/OT.   LOS: 2 days   [facility]  [facility] Pager [phone] 04/11/2012, 8:00 AM

## 2012-04-11 NOTE — Progress Notes (Addendum)
[facility] does not carry "Living with Kidney Failure" Booklet.

## 2012-04-11 NOTE — Progress Notes (Signed)
Pt watched video Kidney Failure Part 1 (406) Overview in Patient Education Network.

## 2012-04-11 NOTE — Progress Notes (Signed)
CARE MANAGEMENT NOTE 04/11/2012  Patient:  Tim Herrera, Tim Herrera   Account Number:  [id number]  Date Initiated:  04/11/2012  Documentation initiated by:  DAVIS,RHONDA  Subjective/Objective Assessment:   pt was transferr to sdu from [facility] due to persistant hypoglycemia and unresponsive to d50 boluses, now on iv d10 infusion     Action/Plan:   from home   Anticipated DC Date:  04/14/2012   Anticipated DC Plan:  HOME/SELF CARE  In-house referral  NA      DC Planning Services  NA      [facility]  NA   Choice offered to / List presented to:  NA   DME arranged  NA      DME agency  NA     HH arranged  NA      HH agency  NA   Status of service:  In process, will continue to follow Medicare Important Message given?  NA - LOS <3 / Initial given by admissions (If response is "NO", the following Medicare IM given date fields will be blank) Date Medicare IM given:   Date Additional Medicare IM given:    Discharge Disposition:    Per UR Regulation:  Reviewed for med. necessity/level of care/duration of stay  If discussed at Long Length of Stay Meetings, dates discussed:    Comments:  [id number] Earlene Plater, RN, BSN, CCM: CHART REVIEWED AND UPDATED. NO DISCHARGE NEEDS PRESENT AT THIS TIME. CASE MANAGEMENT [phone]

## 2012-04-11 NOTE — Consult Note (Signed)
Reason for Consult:AKI/CKD, anasarca, anemia of chronic disease Referring Physician: Davaun Najera is an 76 y.o. male.  HPI: Pt is a 76yo AAM with PMH sig for HTN, DM, nonischemic CMP, CKD stage III, fatty liver disease, and morbid obesity who was admitted on 04/09/12 with hypoglycemia.  Pt forgot to take half of his amaryl dose after he refilled the prescription and has been requiring D10 to keep CBG's in a safe level. We were asked to see the patient due to AKI/CKD since admission.  He was scheduled to see Dr. Eliott Nine in our office for new patient visit due to progressive CKD however he did not show up for the appointment.  Records from Dr. Isabella Stalling' office are in the hard chart and significant for a rising creatinine from to 1.78 in March of 2013 (from 1.38 in August 2012).  Of note, the patient had not seen any physicians for over 20 years until he developed shakiness and diagnosed with DM about 2 years ago so the duration of DM/HTN/CMP/CKD are unknown.  Trend in Creatinine: Creatinine, Ser  Date/Time Value Range Status  04/11/2012  3:20 AM 2.86* 0.50 - 1.35 mg/dL Final  45/40/9811  9:14 AM 2.53* 0.50 - 1.35 mg/dL Final  78/29/5621 30:86 AM 2.28* 0.50 - 1.35 mg/dL Final  5/78/4696 29:52 PM 2.32* 0.50 - 1.35 mg/dL Final  01/16/1323 40:10 PM 2.1* 0.4 - 1.5 mg/dL Final  2/72/5366 44:03 AM 1.9* 0.4 - 1.5 mg/dL Final  4/74/2595  6:38 AM 2.0* 0.4 - 1.5 mg/dL Final  7/56/4332 95:18 AM 1.4  0.4 - 1.5 mg/dL Final  8/41/6606  3:01 AM 1.2  0.4 - 1.5 mg/dL Final  6/0/1093  2:35 PM 1.0  0.4 - 1.5 mg/dL Final    PMH:   Past Medical History  Diagnosis Date  . Hypertension   . Diabetes mellitus   . Cardiomyopathy, nonischemic   . CAD (coronary artery disease)     Cardiac cath May 2012 with mild plaque LAD and Circumflex and severe disease in  very small PDA branch of RCA.     PSH:  History reviewed. No pertinent past surgical history.  Allergies: No Known Allergies  Medications:   Prior  to Admission medications   Medication Sig Start Date End Date Taking? Authorizing Provider  albuterol (VENTOLIN HFA) 108 (90 BASE) MCG/ACT inhaler Inhale 2 puffs into the lungs every 4 (four) hours as needed. For shortness of breath.   Yes Historical Provider, MD  aspirin EC 81 MG tablet Take 81 mg by mouth daily.   Yes Historical Provider, MD  atorvastatin (LIPITOR) 20 MG tablet Take 20 mg by mouth daily.   Yes Historical Provider, MD  carvedilol (COREG) 3.125 MG tablet Take 1 tablet (3.125 mg total) by mouth 2 (two) times daily with a meal. 01/06/12 01/05/13 Yes Duke Salvia, MD  ferrous sulfate 325 (65 FE) MG tablet Take 325 mg by mouth daily with breakfast.   Yes Historical Provider, MD  furosemide (LASIX) 20 MG tablet Take 2 tablets (40 mg total) by mouth 2 (two) times daily. 12/17/11  Yes Kathleene Hazel, MD  glimepiride (AMARYL) 2 MG tablet Take 0.5 tablets (1 mg total) by mouth daily before breakfast. 01/26/12  Yes [name], PA  hydrALAZINE (APRESOLINE) 25 MG tablet Take 0.5 tablets (12.5 mg total) by mouth 3 (three) times daily. 01/26/12 01/25/13 Yes [name], PA  isosorbide mononitrate (IMDUR) 30 MG 24 hr tablet Take 1 tablet (30 mg total) by  mouth daily. 01/26/12 01/25/13 Yes [name], PA  metFORMIN (GLUCOPHAGE) 500 MG tablet Take 500 mg by mouth 2 (two) times daily with a meal.     Yes Historical Provider, MD  potassium chloride SA (K-DUR,KLOR-CON) 20 MEQ tablet Take 20 mEq by mouth daily.   Yes Historical Provider, MD  spironolactone (ALDACTONE) 25 MG tablet Take 1 tablet (25 mg total) by mouth daily. 01/10/12 01/09/13 Yes Aundria Rud, NP    Discontinued Meds:   Medications Discontinued During This Encounter  Medication Reason  . hydrALAZINE (APRESOLINE) 25 MG tablet Patient has not taken in last 30 days  . aspirin 81 MG tablet Patient has not taken in last 30 days  . KLOR-CON M20 20 MEQ tablet Patient has not taken in last 30 days  . dextrose 10 % infusion     . hydrALAZINE (APRESOLINE) tablet 12.5 mg   . spironolactone (ALDACTONE) tablet 25 mg   . 0.9 %  sodium chloride infusion   . sodium chloride 0.9 % injection 3 mL   . sodium chloride 0.9 % injection 3 mL   . enoxaparin (LOVENOX) injection 40 mg   . furosemide (LASIX) injection 80 mg   . insulin aspart (novoLOG) injection 0-9 Units     Social History:  reports that he has never smoked. He does not have any smokeless tobacco history on file. He reports that he does not drink alcohol or use illicit drugs.  Family History:  No family history on file.  A comprehensive review of systems was negative except for: Respiratory: positive for dyspnea on exertion Cardiovascular: positive for lower extremity edema Weight change: 19.321 kg (42 lb 9.5 oz)  Intake/Output Summary (Last 24 hours) at 04/11/12 1157 Last data filed at 04/11/12 1100  Gross per 24 hour  Intake 2003.66 ml  Output   2135 ml  Net -131.34 ml    General appearance: alert, cooperative and obese, sitting upright in a chair Head: Normocephalic, without obvious abnormality, atraumatic Neck: no adenopathy, no carotid bruit, supple, symmetrical, trachea midline and thyroid not enlarged, symmetric, no tenderness/mass/nodules Resp: diminished breath sounds bibasilar Cardio: regular rate and rhythm, no rub and distant heart sounds GI: obese, distended/tense Extremities: edema tense 3+  Labs: Basic Metabolic Panel:  Lab 04/11/12 1610 04/10/12 0514 04/09/12 1146  NA 134* 142 140  K 5.2* 5.0 5.1  CL 105 112 109  CO2 20 22 21   GLUCOSE 67* 44* 43*  BUN 50* 45* 45*  CREATININE 2.86* 2.53* 2.28*  ALBUMIN -- -- 3.4*  CALCIUM 8.0* 8.0* 8.8  PHOS 3.3 -- --   Liver Function Tests:  Lab 04/09/12 1146  AST 21  ALT 23  ALKPHOS 272*  BILITOT 0.4  PROT 7.2  ALBUMIN 3.4*   No results found for this basename: LIPASE:3,AMYLASE:3 in the last 168 hours No results found for this basename: AMMONIA:3 in the last 168  hours CBC:  Lab 04/11/12 0320 04/10/12 0514 04/09/12 1146  WBC 4.4 5.0 4.5  NEUTROABS -- -- --  HGB 8.6* 8.8* 9.5*  HCT 26.8* 27.4* 29.3*  MCV 86.2 86.4 86.4  PLT 224 219 260   PT/INR: @labrcntip (inr:5) Cardiac Enzymes: No results found for this basename: CKTOTAL:5,CKMB:5,CKMBINDEX:5,TROPONINI:5 in the last 168 hours CBG:  Lab 04/11/12 1110 04/11/12 0959 04/11/12 0756 04/11/12 0553 04/11/12 0429  GLUCAP 126* 132* 108* 60* 67*    Iron Studies: No results found for this basename: IRON:30,TIBC:30,TRANSFERRIN:30,FERRITIN:30 in the last 168 hours  Xrays/Other Studies: Dg Chest 2 View  04/09/2012  *RADIOLOGY REPORT*  Clinical Data: Chest pain.  CHEST - 2 VIEW  Comparison: None.  Findings: Mild cardiomegaly. Coarse and prominent perihilar interstitial markings without confluent airspace infiltrate or overt edema.  No effusion. Bridging osteophytes through the mid and lower thoracic spine.  IMPRESSION:  1.  Mild cardiomegaly with central pulmonary vascular congestion.   Original Report Authenticated By: Osa Craver, M.D.      Assessment/Plan: 1.  AKI/CKD- likely due to decompensated CHF with ongoing diuresis, but will need [place] to r/o obstruction.  Will also order SPEP and UPEP as he has not had a workup for his progressive CKD.  Will increase Lasix to 120 q8 and follow. 2. Anasarca- likely combo of decompensated systolic and diastolic CHF, but also with h/o fatty liver disease.  Cont with diuresis. 3. Hyperkalemia- agree with discontinuation of K supplements and spironalactone (pt also with evidence of gynecomastia) 4. Morbid obesity- recommend dietician consult for weight loss, as well as CHF, diabetes and CKD education. 5. Anemia- presumably due to chronic disease but will check iron stores, SPEP, UPEP as above. 6. DM with hypoglycemia- per primary svc 7. Right-sided CHF- high pretest probability of OSA due to obesity hypoventillation syndrome, so CPAP may be helpful with  diuresis.  Need to limit intake 8. HTN- stable   Iyanna Drummer A 04/11/2012, 11:57 AM

## 2012-04-12 ENCOUNTER — Ambulatory Visit (HOSPITAL_COMMUNITY): Payer: Medicare Other

## 2012-04-12 DIAGNOSIS — N1330 Unspecified hydronephrosis: Secondary | ICD-10-CM | POA: Diagnosis present

## 2012-04-12 LAB — CBC
Hemoglobin: 8.9 g/dL — ABNORMAL LOW (ref 13.0–17.0)
MCHC: 32.7 g/dL (ref 30.0–36.0)
RBC: 3.15 MIL/uL — ABNORMAL LOW (ref 4.22–5.81)

## 2012-04-12 LAB — RENAL FUNCTION PANEL
GFR calc Af Amer: 25 mL/min — ABNORMAL LOW (ref >90)
GFR calc non Af Amer: 21 mL/min — ABNORMAL LOW (ref >90)
Glucose, Bld: 396 mg/dL — ABNORMAL HIGH (ref 70–99)
Phosphorus: 4.1 mg/dL (ref 2.3–4.6)
Potassium: 4.2 mEq/L (ref 3.5–5.1)
Sodium: 131 mEq/L — ABNORMAL LOW (ref 135–145)

## 2012-04-12 LAB — GLUCOSE, CAPILLARY
Glucose-Capillary: 147 mg/dL — ABNORMAL HIGH (ref 70–99)
Glucose-Capillary: 134 mg/dL — ABNORMAL HIGH (ref 70–99)
Glucose-Capillary: 125 mg/dL — ABNORMAL HIGH (ref 70–99)
Glucose-Capillary: 177 mg/dL — ABNORMAL HIGH (ref 70–99)
Glucose-Capillary: 175 mg/dL — ABNORMAL HIGH (ref 70–99)

## 2012-04-12 LAB — IRON AND TIBC
Iron: 16 ug/dL — ABNORMAL LOW (ref 42–135)
TIBC: 242 ug/dL (ref 215–435)

## 2012-04-12 LAB — FOLATE RBC: RBC Folate: 445 ng/mL (ref >=366)

## 2012-04-12 LAB — KAPPA/LAMBDA LIGHT CHAINS: Kappa free light chain: 3.63 mg/dL — ABNORMAL HIGH (ref 0.33–1.94)

## 2012-04-12 LAB — RETICULOCYTES: Retic Count, Absolute: 34.7 10*3/uL (ref 19.0–186.0)

## 2012-04-12 MED ORDER — SODIUM CHLORIDE 0.9 % IV SOLN
1020.0000 mg | Freq: Once | INTRAVENOUS | Status: AC
  Administered 2012-04-12: 1020 mg via INTRAVENOUS
  Filled 2012-04-12: qty 34

## 2012-04-12 NOTE — Progress Notes (Signed)
Hourly rounding on pt and found urine on the floor next to pt's chair and underneath pt's feet. Pt stated "I guess I just missed the urinal." Restarted 24-hour urine collection for 04/12/12 at 2300 and put two new collection jugs in the room. Will continue to monitor. - Christell Faith, RN

## 2012-04-12 NOTE — Evaluation (Signed)
Occupational Therapy Evaluation Patient Details Name: Tim Herrera MRN: [id number] DOB: [birth year] Today's Date: 04/12/2012 Time: 4098-1191 OT Time Calculation (min): 22 min  OT Assessment / Plan / Recommendation Clinical Impression  This 76 year old man was admitted for hypoglycemia.  He has a h/o chf, dm, and htn.  Pt would benefit from continued ot to increase independence with adls and bathroom transfers with supervision level goals.      OT Assessment  Patient needs continued OT Services    Follow Up Recommendations  No OT follow up    Barriers to Discharge      Equipment Recommendations  None recommended by OT (likely)    Recommendations for Other Services    Frequency  Min 2X/week    Precautions / Restrictions Precautions Precautions: None Restrictions Weight Bearing Restrictions: No   Pertinent Vitals/Pain No pain    ADL  Grooming: Simulated;Min guard Where Assessed - Grooming: Supported standing Upper Body Bathing: Simulated;Set up Where Assessed - Upper Body Bathing: Unsupported sitting Lower Body Bathing: Simulated;Minimal assistance Where Assessed - Lower Body Bathing: Supported sit to stand Upper Body Dressing: Simulated;Minimal assistance (lines) Where Assessed - Upper Body Dressing: Supported sitting Lower Body Dressing: Simulated;Minimal assistance Where Assessed - Lower Body Dressing: Supported sit to Pharmacist, hospital: [employer] engineer Method: Sit to Barista:  (bed to recliner) Toileting - Clothing Manipulation and Hygiene: Simulated;Min guard Where Assessed - Toileting Clothing Manipulation and Hygiene: Sit to stand from 3-in-1 or toilet Transfers/Ambulation Related to ADLs: ambulated in hall with PT:  min guard A.  HR 89, 115, 91.  02 sats 99-100 throughout.  BP 168/78 ADL Comments: Began energy conservation education.  Pt bends to reach feet; dyspnea 2/4    OT Diagnosis: Generalized  weakness  OT Problem List: Decreased strength;Decreased activity tolerance;Decreased knowledge of use of DME or AE;Obesity OT Treatment Interventions: Self-care/ADL training;Energy conservation;DME and/or AE instruction;Patient/family education   OT Goals Acute Rehab OT Goals OT Goal Formulation: With patient Time For Goal Achievement: 04/26/12 Potential to Achieve Goals: Good ADL Goals Pt Will Perform Lower Body Bathing: with supervision;Sit to stand from chair ADL Goal: Lower Body Bathing - Progress: Goal set today Pt Will Perform Lower Body Dressing: with supervision;Sit to stand from chair ADL Goal: Lower Body Dressing - Progress: Goal set today Pt Will Transfer to Toilet: with supervision;Ambulation;Comfort height toilet ADL Goal: Toilet Transfer - Progress: Goal set today Pt Will Perform Tub/Shower Transfer: Tub transfer;Ambulation;with min assist (min guard) ADL Goal: Tub/Shower Transfer - Progress: Goal set today Miscellaneous OT Goals Miscellaneous OT Goal #1: Pt will verbalize 3 energy conservation strategies OT Goal: Miscellaneous Goal #1 - Progress: Goal set today  Visit Information  Last OT Received On: 04/12/12    Subjective Data  Subjective: I want to walk to the front door Patient Stated Goal: get back to being independent   Prior Functioning     Home Living Lives With: Spouse Type of Home: House Home Access: Level entry Home Layout: One level Bathroom Shower/Tub: Engineer, manufacturing systems: Standard Home Adaptive Equipment: None Prior Function Level of Independence: Independent Driving: Yes Communication Communication: No difficulties         Vision/Perception     Cognition  Overall Cognitive Status: Appears within functional limits for tasks assessed/performed Arousal/Alertness: Awake/alert Orientation Level: Appears intact for tasks assessed Behavior During Session: [facility] for tasks performed    Extremity/Trunk Assessment Right Upper  Extremity Assessment RUE ROM/Strength/Tone: [facility] for tasks assessed Left  Upper Extremity Assessment LUE ROM/Strength/Tone: WFL for tasks assessed     Mobility Transfers Transfers: Sit to Stand Sit to Stand: 4: Min assist     Shoulder Instructions     Exercise     Balance     End of Session OT - End of Session Activity Tolerance: Patient tolerated treatment well Patient left: in chair;with call bell/phone within reach  GO     Daquan Crapps 04/12/2012, 9:22 AM Marica Otter, OTR/L [phone] 04/12/2012

## 2012-04-12 NOTE — Progress Notes (Signed)
Report given to Marisa RN.

## 2012-04-12 NOTE — Progress Notes (Signed)
Patient ID: Tim Herrera, male   DOB: 1935-07-07, 76 y.o.   MRN: [id number] S:Pt feels better O:BP 127/65  Pulse 78  Temp 98.9 F (37.2 C) (Oral)  Resp 18  Ht 5\' 10"  (1.778 m)  Wt 69 kg (152 lb 1.9 oz)  BMI 21.83 kg/m2  SpO2 99%  Intake/Output Summary (Last 24 hours) at 04/12/12 1202 Last data filed at 04/12/12 1100  Gross per 24 hour  Intake 1364.5 ml  Output   4576 ml  Net -3211.5 ml   Intake/Output: I/O last 3 completed shifts: In: 2150.5 [P.O.:720; I.V.:1290.5; IV Piggyback:140] Out: 5136 [Urine:5135; Stool:1]  Intake/Output this shift:  Total I/O In: 390 [P.O.:360; I.V.:30] Out: 1325 [Urine:1325] Weight change:  Gen:WD obese AAM in NAD WUJ:WJXBJ HS, no rub Resp:decreased BS at bases YNW:GNFAO Ext:3+pitting edema, tense   Lab 04/12/12 0500 04/11/12 1125 04/11/12 0320 04/10/12 0514 04/09/12 1146  NA 131* 132* 134* 142 140  K 4.2 5.0 5.2* 5.0 5.1  CL 99 103 105 112 109  CO2 21 20 20 22 21   GLUCOSE 396* 365* 67* 44* 43*  BUN 51* 48* 50* 45* 45*  CREATININE 2.75* 2.77* 2.86* 2.53* 2.28*  ALBUMIN 3.0* -- -- -- 3.4*  CALCIUM 8.7 8.1* 8.0* 8.0* 8.8  PHOS 4.1 -- 3.3 -- --  AST -- -- -- -- 21  ALT -- -- -- -- 23   Liver Function Tests:  Lab 04/12/12 0500 04/09/12 1146  AST -- 21  ALT -- 23  ALKPHOS -- 272*  BILITOT -- 0.4  PROT -- 7.2  ALBUMIN 3.0* 3.4*   No results found for this basename: LIPASE:3,AMYLASE:3 in the last 168 hours No results found for this basename: AMMONIA:3 in the last 168 hours CBC:  Lab 04/12/12 0500 04/11/12 0320 04/10/12 0514 04/09/12 1146  WBC 4.7 4.4 5.0 --  NEUTROABS -- -- -- --  HGB 8.9* 8.6* 8.8* --  HCT 27.2* 26.8* 27.4* --  MCV 86.3 86.2 86.4 86.4  PLT 254 224 219 --   Cardiac Enzymes: No results found for this basename: CKTOTAL:5,CKMB:5,CKMBINDEX:5,TROPONINI:5 in the last 168 hours CBG:  Lab 04/12/12 0846 04/12/12 0609 04/12/12 0259 04/11/12 2342 04/11/12 2203  GLUCAP 215* 125* 134* 120* 147*    Iron Studies:    Basename 04/12/12 0500  IRON 16*  TIBC 242  TRANSFERRIN --  FERRITIN 172   Studies/Results: US Renal  04/11/2012  *RADIOLOGY REPORT*  Clinical Data: Renal insufficiency.  Hypertension and diabetes  RENAL/URINARY TRACT ULTRASOUND COMPLETE  Comparison:  None.  Findings:  Right Kidney:  15.7 cm in length.  There is marked hydronephrosis on the right.  Cortex appears thinned which may indicate chronic obstruction.  Suboptimal images of the right kidney due to body habitus  Left Kidney:  14.4 cm.  Negative for obstruction.  13 mm cyst.  Bladder:  Urinary bladder is filled without mass.  Ureteral jets are not identified.  Examination is significantly limited by body habitus  IMPRESSION: Marked hydronephrosis on the right.  No obstruction of the left kidney.   Original Report Authenticated By: Camelia Phenes, M.D.       . alteplase  2 mg Intracatheter Once  . aspirin EC  81 mg Oral Daily  . atorvastatin  20 mg Oral Daily  . carvedilol  3.125 mg Oral BID WC  . dextrose  1 ampule Intravenous Once  . ferrous sulfate  325 mg Oral Q breakfast  . furosemide  120 mg Intravenous Q8H  . heparin subcutaneous  5,000 Units Subcutaneous Q8H  . isosorbide mononitrate  30 mg Oral Daily  . sodium chloride  10-40 mL Intracatheter Q12H  . sodium chloride  3 mL Intravenous Q12H  . DISCONTD: furosemide  80 mg Intravenous Q8H    BMET    Component Value Date/Time   NA 131* 04/12/2012 0500   K 4.2 04/12/2012 0500   CL 99 04/12/2012 0500   CO2 21 04/12/2012 0500   GLUCOSE 396* 04/12/2012 0500   BUN 51* 04/12/2012 0500   CREATININE 2.75* 04/12/2012 0500   CALCIUM 8.7 04/12/2012 0500   GFRNONAA 21* 04/12/2012 0500   GFRAA 25* 04/12/2012 0500   CBC    Component Value Date/Time   WBC 4.7 04/12/2012 0500   RBC 3.15* 04/12/2012 0500   HGB 8.9* 04/12/2012 0500   HCT 27.2* 04/12/2012 0500   PLT 254 04/12/2012 0500   MCV 86.3 04/12/2012 0500   MCH 28.3 04/12/2012 0500   MCHC 32.7 04/12/2012 0500   RDW  15.3 04/12/2012 0500   LYMPHSABS 0.8 10/27/2010 0938   MONOABS 0.6 10/27/2010 0938   EOSABS 0.6 10/27/2010 0938   BASOSABS 0.0 10/27/2010 0938   Assessment/Plan:  1. AKI/CKD- marked hydronephrosis of right kidney with renal cortex thinning.  May be chronic but likely contributing to the AKI/CKD.  Agree with Urology evaluation.  Overall likely combination of decompensated CHF with ongoing diuresis and hydronephrosis. 1. SPEP and UPEP pending as he has not had a workup for his progressive CKD.  2. Did well with increase of Lasix to 120 q8.  Continue to follow. 2. Anasarca- likely combo of decompensated systolic and diastolic CHF, but also with h/o fatty liver disease. Cont with diuresis. -3L over last 24 hours. 3. Hyperkalemia- agree with discontinuation of K supplements and spironalactone (pt also with evidence of gynecomastia) 4. Morbid obesity- recommend dietician consult for weight loss, as well as CHF, diabetes and CKD education. 5. Anemia- presumably due to chronic disease but will check iron stores, SPEP, UPEP as above. 6. DM with hypoglycemia- per primary svc 7. Right-sided CHF- high pretest probability of OSA due to obesity hypoventillation syndrome, so CPAP may be helpful with diuresis. Need to limit intake 8. Iron deficiency- will dose with Feraheme and follow. 9. HTN- stable 10.  Daxter Paule A

## 2012-04-12 NOTE — Progress Notes (Signed)
[facility] PROGRESS NOTE  Tim Herrera JXB:[id number] DOB: 26-Sep-1935 DOA: 04/09/2012 PCP: Tim Lightning, MD  Assessment/Plan: 1. Hypoglycemia--resolved. Secondary to overmedication. No hypoglycemia x24 hours. Stop D10. Follow CBG. 2. Acute systolic CHF/NICM--negative fluid balance. Asymptomatic. Diuretics per nephrology. Continue Coreg, statin, ASA, Imdur. Spironolactone stopped secondary to borderline hyperkalemia. Not an ACE-I given AKI. During last visit with Cards ICD was discussed. Follow-up with heart failure clinic on discharge. 3. AKI/CKD stage III--stable today. Workup per nephrology. Renal ultrasound revealed marked hydronephrosis. Urology consult. SPEP and UPEP pending per nephrology.  4. Marked right hydronephrosis--may be contributing to progressive kidney disease. Urology consultation placed this AM. 5. Anasarca--secondary to CHF. Daily weights, I/O. 6. Hyperkalemia--resolved. Spironolactone discontinued. 7. Anemia--normocytic. Stable. Follow-up as outpatient. 8. DM type 2--now stabilized. Follow CBG. No meds. HBa1c is 5.6. 9. HTN--follow, continue current medications. Expect improvement with diuresis.  Code Status: full code Family Communication: none present Disposition Plan: home when improved  [name], MD  [facility] Team 6 Pager [phone]. If 8PM-8AM, please contact night-coverage at [email or web address], password [email or web address] 04/12/2012, 10:15 AM  LOS: 3 days   Brief narrative: 76 y/o man with h/o combined CHF, DM, HTN presented with low blood sugar and shortness of breath. In the beginning of September became diaphoretic and shaky and was found to be hyperglycemic. His PCP had asked him to cut his Amaryl in half. When he refilled his latest prescription, about 2 weeks ago, he forgot to cut them in half and has been taking the entire pill. The day before admission, EMS was called to his house as he again became diaphoretic and confused. Was found to have a sugar  in the 40s. Was given D50 but was not transported to the ED. This recurred again on the day of admission.   Consultants:  Nephrology  OT--no follow-up needed.  PT--HH PT.  Procedures:  PICC 10/28 >>  HPI/Subjective: Feels fine, no complaints. No SOB. Discussed with RN--no concerns.  Objective: Filed Vitals:   04/11/12 2000 04/12/12 0000 04/12/12 0400 04/12/12 0801  BP:   153/68 168/78  Pulse:   85 95  Temp: 98.5 F (36.9 C) 97.9 F (36.6 C) 97.9 F (36.6 C) 98.9 F (37.2 C)  TempSrc: Oral Oral Oral Oral  Resp:   16   Height:      Weight:      SpO2:   98%     Intake/Output Summary (Last 24 hours) at 04/12/12 1015 Last data filed at 04/12/12 0900  Gross per 24 hour  Intake 1684.5 ml  Output   4351 ml  Net -2666.5 ml   Filed Weights   04/10/12 0500 04/10/12 1200 04/11/12 0400  Weight: 155.7 kg (343 lb 4.1 oz) 155.4 kg (342 lb 9.5 oz) 69 kg (152 lb 1.9 oz)    Exam:  General:  Appears calm and comfortable Cardiovascular: RRR, no m/r/g. 3+ BLE edema. Telemetry: SR, no arrhythmias  Respiratory: CTA bilaterally, no w/r/r. Normal respiratory effort. Psychiatric: grossly normal mood and affect, speech fluent and appropriate  Data Reviewed: Basic Metabolic Panel:  Lab 04/12/12 8469 04/11/12 1125 04/11/12 0320 04/10/12 0514 04/09/12 1146  NA 131* 132* 134* 142 140  K 4.2 5.0 5.2* 5.0 5.1  CL 99 103 105 112 109  CO2 21 20 20 22 21   GLUCOSE 396* 365* 67* 44* 43*  BUN 51* 48* 50* 45* 45*  CREATININE 2.75* 2.77* 2.86* 2.53* 2.28*  CALCIUM 8.7 8.1* 8.0* 8.0* 8.8  MG -- -- -- -- --  PHOS 4.1 -- 3.3 -- --   Liver Function Tests:  Lab 04/12/12 0500 04/09/12 1146  AST -- 21  ALT -- 23  ALKPHOS -- 272*  BILITOT -- 0.4  PROT -- 7.2  ALBUMIN 3.0* 3.4*   CBC:  Lab 04/12/12 0500 04/11/12 0320 04/10/12 0514 04/09/12 1146  WBC 4.7 4.4 5.0 4.5  NEUTROABS -- -- -- --  HGB 8.9* 8.6* 8.8* 9.5*  HCT 27.2* 26.8* 27.4* 29.3*  MCV 86.3 86.2 86.4 86.4  PLT 254 224  219 260     Basename 04/09/12 1146  PROBNP 14183.0*   CBG:  Lab 04/12/12 0846 04/12/12 0609 04/12/12 0259 04/11/12 2342 04/11/12 2203  GLUCAP 215* 125* 134* 120* 147*    Recent Results (from the past 240 hour(s))  MRSA PCR SCREENING     Status: Normal   Collection Time   04/10/12  2:22 PM      Component Value Range Status Comment   MRSA by PCR NEGATIVE  NEGATIVE Final      Studies: US Renal  04/11/2012  *RADIOLOGY REPORT*  Clinical Data: Renal insufficiency.  Hypertension and diabetes  RENAL/URINARY TRACT ULTRASOUND COMPLETE  Comparison:  None.  Findings:  Right Kidney:  15.7 cm in length.  There is marked hydronephrosis on the right.  Cortex appears thinned which may indicate chronic obstruction.  Suboptimal images of the right kidney due to body habitus  Left Kidney:  14.4 cm.  Negative for obstruction.  13 mm cyst.  Bladder:  Urinary bladder is filled without mass.  Ureteral jets are not identified.  Examination is significantly limited by body habitus  IMPRESSION: Marked hydronephrosis on the right.  No obstruction of the left kidney.   Original Report Authenticated By: Camelia Phenes, M.D.     Scheduled Meds:   . alteplase  2 mg Intracatheter Once  . aspirin EC  81 mg Oral Daily  . atorvastatin  20 mg Oral Daily  . carvedilol  3.125 mg Oral BID WC  . dextrose  1 ampule Intravenous Once  . ferrous sulfate  325 mg Oral Q breakfast  . furosemide  120 mg Intravenous Q8H  . heparin subcutaneous  5,000 Units Subcutaneous Q8H  . isosorbide mononitrate  30 mg Oral Daily  . sodium chloride  10-40 mL Intracatheter Q12H  . sodium chloride  3 mL Intravenous Q12H  . DISCONTD: furosemide  80 mg Intravenous Q8H   Continuous Infusions:   . dextrose 30 mL/hr at 04/11/12 2218    Principal Problem:  *Hypoglycemia Active Problems:  Chronic systolic congestive heart failure, NYHA class 2  HTN (hypertension)  CKD (chronic kidney disease) stage 3, GFR 30-59 ml/min  Acute on chronic  combined systolic and diastolic CHF (congestive heart failure)  DM (diabetes mellitus)  Anemia  Acute on chronic renal failure  Fatty liver  Obesity, Class III, BMI 40-49.9 (morbid obesity)     [name], MD  [facility] Team 6 Pager [phone]. If 8PM-8AM, please contact night-coverage at [email or web address], password [email or web address] 04/12/2012, 10:15 AM  LOS: 3 days   Time spent: 30 minutes

## 2012-04-12 NOTE — Evaluation (Signed)
Physical Therapy Evaluation Patient Details Name: Tim Herrera MRN: [id number] DOB: [birth year] Today's Date: 04/12/2012 Time: 0802-0824 PT Time Calculation (min): 22 min  PT Assessment / Plan / Recommendation Clinical Impression  ]Pt. AS ADMITTED 04/09/12 with hypoglycemia CKD. Pt was ablwe to ambulate x 180 ft. Pt. will benfit from PT tp return home at Independent level.    PT Assessment  Patient needs continued PT services    Follow Up Recommendations  Home health PT    Does the patient have the potential to tolerate intense rehabilitation      Barriers to Discharge        Equipment Recommendations  None recommended by PT    Recommendations for Other Services     Frequency Min 3X/week    Precautions / Restrictions Precautions Precautions: None Restrictions Weight Bearing Restrictions: No   Pertinent Vitals/Pain Max HR 115. sats 100% RA      Mobility  Bed Mobility Bed Mobility: Not assessed Transfers Transfers: Sit to Stand;Stand to Sit Sit to Stand: From chair/3-in-1;With upper extremity assist;4: Min guard Stand to Sit: 4: Min guard Ambulation/Gait Ambulation/Gait Assistance: 4: Min assist Ambulation Distance (Feet): 180 Feet Assistive device: Rolling walker Ambulation/Gait Assistance Details: VC for posture Gait Pattern: Step-through pattern;Wide base of support;Decreased stride length Gait velocity: decreased    Shoulder Instructions     Exercises     PT Diagnosis: Difficulty walking;Generalized weakness  PT Problem List: Decreased strength;Decreased activity tolerance;Decreased range of motion;Decreased mobility;Decreased knowledge of use of DME;Decreased safety awareness PT Treatment Interventions: DME instruction;Gait training;Therapeutic activities;Therapeutic exercise;Functional mobility training;Patient/family education   PT Goals Acute Rehab PT Goals PT Goal Formulation: With patient Time For Goal Achievement: 04/26/12 Potential to Achieve  Goals: Good Pt will go Supine/Side to Sit: Independently PT Goal: Supine/Side to Sit - Progress: Goal set today Pt will go Sit to Supine/Side: Independently PT Goal: Sit to Supine/Side - Progress: Goal set today Pt will go Sit to Stand: with modified independence PT Goal: Sit to Stand - Progress: Goal set today Pt will go Stand to Sit: with modified independence PT Goal: Stand to Sit - Progress: Goal set today Pt will Ambulate: >150 feet;with modified independence;with least restrictive assistive device PT Goal: Ambulate - Progress: Goal set today  Visit Information  Last PT Received On: 04/12/12 Assistance Needed: +1    Subjective Data  Subjective: where is the door to go out? Patient Stated Goal:  to go home   Prior Functioning  Home Living Lives With: Spouse Type of Home: House Home Access: Level entry Home Layout: One level Bathroom Shower/Tub: Engineer, manufacturing systems: Standard Home Adaptive Equipment: Environmental consultant - rolling Prior Function Level of Independence: Independent Driving: Yes Communication Communication: No difficulties    Cognition  Overall Cognitive Status: Appears within functional limits for tasks assessed/performed Arousal/Alertness: Awake/alert Orientation Level: Appears intact for tasks assessed Behavior During Session: [facility] for tasks performed    Extremity/Trunk Assessment Right Upper Extremity Assessment RUE ROM/Strength/Tone: [facility] for tasks assessed Left Upper Extremity Assessment LUE ROM/Strength/Tone: WFL for tasks assessed Right Lower Extremity Assessment RLE ROM/Strength/Tone: Deficits;WFL for tasks assessed RLE ROM/Strength/Tone Deficits: some limits in ROM due to edema,  RLE Sensation: WFL - Light Touch Left Lower Extremity Assessment LLE ROM/Strength/Tone: Deficits;WFL for tasks assessed LLE ROM/Strength/Tone Deficits: edema present   Balance    End of Session PT - End of Session Activity Tolerance: Patient tolerated treatment  well Patient left: in chair;with call bell/phone within reach Nurse Communication: Mobility status  GP  Rada Hay 04/12/2012, 10:08 AM

## 2012-04-12 NOTE — Consult Note (Signed)
Urology Consult   Physician requesting consult: Irene Limbo  Reason for consult: Right sided hydronephrosis  History of Present Illness: Tim Herrera is a 76 y.o. AA male with PMH significant for DM, HTN, cardiomyopathy, CKD, and CAD who was admitted on 04/09/12 for treatment of hypoglycemia and SOB.  Pt's Cr was elevated on admission and a nephrology consult was obtained for eval/tx of AKI/CKD.  As part of his eval a RUS was performed which revealed marked hydronephrosis and cortical atrophy on the right.  Pt is not aware of any hx of hydro and has never had stones.  He has been medically noncompliant in the past.  He denies back pain, abdominal pain, and hematuria.  He also denies a history of voiding or storage urinary symptoms, UTIs, STDs, and GU malignancy/trauma/surgery.   He is currently resting comfortably and is without complaint.  He denies F/C, SOB, CP, N/V, diarrhea/constipation, and back pain.    Past Medical History  Diagnosis Date  . Hypertension   . Diabetes mellitus   . Cardiomyopathy, nonischemic   . CAD (coronary artery disease)     Cardiac cath May 2012 with mild plaque LAD and Circumflex and severe disease in  very small PDA branch of RCA.   CKD  History reviewed. No pertinent past surgical history.  Current Hospital Medications:  Home Meds:  Prior to Admission medications   Medication Sig Start Date End Date Taking? Authorizing Provider  albuterol (VENTOLIN HFA) 108 (90 BASE) MCG/ACT inhaler Inhale 2 puffs into the lungs every 4 (four) hours as needed. For shortness of breath.   Yes Historical Provider, MD  aspirin EC 81 MG tablet Take 81 mg by mouth daily.   Yes Historical Provider, MD  atorvastatin (LIPITOR) 20 MG tablet Take 20 mg by mouth daily.   Yes Historical Provider, MD  carvedilol (COREG) 3.125 MG tablet Take 1 tablet (3.125 mg total) by mouth 2 (two) times daily with a meal. 01/06/12 01/05/13 Yes Duke Salvia, MD  ferrous sulfate 325 (65 FE) MG tablet  Take 325 mg by mouth daily with breakfast.   Yes Historical Provider, MD  furosemide (LASIX) 20 MG tablet Take 2 tablets (40 mg total) by mouth 2 (two) times daily. 12/17/11  Yes Kathleene Hazel, MD  glimepiride (AMARYL) 2 MG tablet Take 0.5 tablets (1 mg total) by mouth daily before breakfast. 01/26/12  Yes [name], PA  hydrALAZINE (APRESOLINE) 25 MG tablet Take 0.5 tablets (12.5 mg total) by mouth 3 (three) times daily. 01/26/12 01/25/13 Yes [name], PA  isosorbide mononitrate (IMDUR) 30 MG 24 hr tablet Take 1 tablet (30 mg total) by mouth daily. 01/26/12 01/25/13 Yes [name], PA  metFORMIN (GLUCOPHAGE) 500 MG tablet Take 500 mg by mouth 2 (two) times daily with a meal.     Yes Historical Provider, MD  potassium chloride SA (K-DUR,KLOR-CON) 20 MEQ tablet Take 20 mEq by mouth daily.   Yes Historical Provider, MD  spironolactone (ALDACTONE) 25 MG tablet Take 1 tablet (25 mg total) by mouth daily. 01/10/12 01/09/13 Yes Aundria Rud, NP    Scheduled Meds:   . alteplase  2 mg Intracatheter Once  . aspirin EC  81 mg Oral Daily  . atorvastatin  20 mg Oral Daily  . carvedilol  3.125 mg Oral BID WC  . dextrose  1 ampule Intravenous Once  . ferrous sulfate  325 mg Oral Q breakfast  . ferumoxytol  1,020 mg Intravenous Once  . furosemide  120 mg Intravenous Q8H  . heparin subcutaneous  5,000 Units Subcutaneous Q8H  . isosorbide mononitrate  30 mg Oral Daily  . sodium chloride  10-40 mL Intracatheter Q12H  . sodium chloride  3 mL Intravenous Q12H   Continuous Infusions:   . DISCONTD: dextrose 30 mL/hr at 04/11/12 2218   PRN Meds:.acetaminophen, acetaminophen, albuterol, glucose, ondansetron (ZOFRAN) IV, ondansetron, oxyCODONE, senna-docusate, sodium chloride  Allergies: No Known Allergies  No family history on file.  Denies family hx of renal issues  Social History:  reports that he has never smoked. He does not have any smokeless tobacco history on file. He reports  that he does not drink alcohol or use illicit drugs.  ROS: A complete review of systems was performed.  All systems are negative except for pertinent findings as noted.  Physical Exam:  Vital signs in last 24 hours: Temp:  [97.9 F (36.6 C)-98.9 F (37.2 C)] 98.9 F (37.2 C) (10/30 0801) Pulse Rate:  [78-95] 78  (10/30 1000) Resp:  [16-22] 18  (10/30 1000) BP: (127-168)/(65-85) 127/65 mmHg (10/30 1000) SpO2:  [98 %-99 %] 99 % (10/30 1000) General:  Alert and oriented, No acute distress; obese HEENT: Normocephalic, atraumatic Neck: No JVD or lymphadenopathy Cardiovascular: Regular rate and rhythm Lungs: Clear bilaterally Abdomen: Soft, nontender, mildly distended; no abdominal masses Back: No CVA tenderness Extremities: 1+ edema bilat LE  Neurologic: Grossly intact  Laboratory Data:   Basename 04/12/12 0500 04/11/12 0320 04/10/12 0514  WBC 4.7 4.4 5.0  HGB 8.9* 8.6* 8.8*  HCT 27.2* 26.8* 27.4*  PLT 254 224 219     Basename 04/12/12 0500 04/11/12 1125 04/11/12 0320 04/10/12 0514  NA 131* 132* 134* 142  K 4.2 5.0 5.2* 5.0  CL 99 103 105 112  GLUCOSE 396* 365* 67* 44*  BUN 51* 48* 50* 45*  CALCIUM 8.7 8.1* 8.0* 8.0*  CREATININE 2.75* 2.77* 2.86* 2.53*     Results for orders placed during the hospital encounter of 04/09/12 (from the past 24 hour(s))  GLUCOSE, CAPILLARY     Status: Abnormal   Collection Time   04/11/12  3:28 PM      Component Value Range   Glucose-Capillary 146 (*) 70 - 99 mg/dL   Comment 1 Documented in Chart     Comment 2 Notify RN    GLUCOSE, CAPILLARY     Status: Abnormal   Collection Time   04/11/12  6:27 PM      Component Value Range   Glucose-Capillary 174 (*) 70 - 99 mg/dL  GLUCOSE, CAPILLARY     Status: Abnormal   Collection Time   04/11/12 10:03 PM      Component Value Range   Glucose-Capillary 147 (*) 70 - 99 mg/dL  GLUCOSE, CAPILLARY     Status: Abnormal   Collection Time   04/11/12 11:42 PM      Component Value Range    Glucose-Capillary 120 (*) 70 - 99 mg/dL  GLUCOSE, CAPILLARY     Status: Abnormal   Collection Time   04/12/12  2:59 AM      Component Value Range   Glucose-Capillary 134 (*) 70 - 99 mg/dL  CBC     Status: Abnormal   Collection Time   04/12/12  5:00 AM      Component Value Range   WBC 4.7  4.0 - 10.5 K/uL   RBC 3.15 (*) 4.22 - 5.81 MIL/uL   Hemoglobin 8.9 (*) 13.0 - 17.0 g/dL   HCT 40.9 (*)  39.0 - 52.0 %   MCV 86.3  78.0 - 100.0 fL   MCH 28.3  26.0 - 34.0 pg   MCHC 32.7  30.0 - 36.0 g/dL   RDW 16.1  09.6 - 04.5 %   Platelets 254  150 - 400 K/uL  RENAL FUNCTION PANEL     Status: Abnormal   Collection Time   04/12/12  5:00 AM      Component Value Range   Sodium 131 (*) 135 - 145 mEq/L   Potassium 4.2  3.5 - 5.1 mEq/L   Chloride 99  96 - 112 mEq/L   CO2 21  19 - 32 mEq/L   Glucose, Bld 396 (*) 70 - 99 mg/dL   BUN 51 (*) 6 - 23 mg/dL   Creatinine, Ser 4.09 (*) 0.50 - 1.35 mg/dL   Calcium 8.7  8.4 - 81.1 mg/dL   Phosphorus 4.1  2.3 - 4.6 mg/dL   Albumin 3.0 (*) 3.5 - 5.2 g/dL   GFR calc non Af Amer 21 (*) >90 mL/min   GFR calc Af Amer 25 (*) >90 mL/min  VITAMIN B12     Status: Normal   Collection Time   04/12/12  5:00 AM      Component Value Range   Vitamin B-12 419  211 - 911 pg/mL  IRON AND TIBC     Status: Abnormal   Collection Time   04/12/12  5:00 AM      Component Value Range   Iron 16 (*) 42 - 135 ug/dL   TIBC 914  782 - 956 ug/dL   Saturation Ratios 7 (*) 20 - 55 %   UIBC 226  125 - 400 ug/dL  FERRITIN     Status: Normal   Collection Time   04/12/12  5:00 AM      Component Value Range   Ferritin 172  22 - 322 ng/mL  RETICULOCYTES     Status: Abnormal   Collection Time   04/12/12  5:00 AM      Component Value Range   Retic Ct Pct 1.1  0.4 - 3.1 %   RBC. 3.15 (*) 4.22 - 5.81 MIL/uL   Retic Count, Manual 34.7  19.0 - 186.0 K/uL  GLUCOSE, CAPILLARY     Status: Abnormal   Collection Time   04/12/12  6:09 AM      Component Value Range   Glucose-Capillary  125 (*) 70 - 99 mg/dL  GLUCOSE, CAPILLARY     Status: Abnormal   Collection Time   04/12/12  8:46 AM      Component Value Range   Glucose-Capillary 215 (*) 70 - 99 mg/dL   Comment 1 Documented in Chart     Comment 2 Notify RN    GLUCOSE, CAPILLARY     Status: Abnormal   Collection Time   04/12/12 11:54 AM      Component Value Range   Glucose-Capillary 145 (*) 70 - 99 mg/dL   Recent Results (from the past 240 hour(s))  MRSA PCR SCREENING     Status: Normal   Collection Time   04/10/12  2:22 PM      Component Value Range Status Comment   MRSA by PCR NEGATIVE  NEGATIVE Final     Renal Function:  Basename 04/12/12 0500 04/11/12 1125 04/11/12 0320 04/10/12 0514 04/09/12 1146  CREATININE 2.75* 2.77* 2.86* 2.53* 2.28*   Estimated Creatinine Clearance: 23 ml/min (by C-G formula based on Cr of 2.75).  Radiologic Imaging:  US Renal  04/11/2012  *RADIOLOGY REPORT*  Clinical Data: Renal insufficiency.  Hypertension and diabetes  RENAL/URINARY TRACT ULTRASOUND COMPLETE  Comparison:  None.  Findings:  Right Kidney:  15.7 cm in length.  There is marked hydronephrosis on the right.  Cortex appears thinned which may indicate chronic obstruction.  Suboptimal images of the right kidney due to body habitus  Left Kidney:  14.4 cm.  Negative for obstruction.  13 mm cyst.  Bladder:  Urinary bladder is filled without mass.  Ureteral jets are not identified.  Examination is significantly limited by body habitus  IMPRESSION: Marked hydronephrosis on the right.  No obstruction of the left kidney.   Original Report Authenticated By: Camelia Phenes, M.D.     Impression/Assessment:   Marked right sided hydronephrosis in pt with AKI/CKD.  Obstruction/hydro likely chronic with cortical atrophy noted on U/S.  Plan:  Will obtain non contrasted CT A/P to eval for stones/other causes of obstruction.  Tim Herrera 04/12/2012, 2:20 PM

## 2012-04-12 NOTE — Consult Note (Signed)
Pt seen and examined. I agree with the above note. Again the patient denies any trouble voiding, dysuria or hematuria. He has had no abdominal surgery. He has no history of stone. He has no smoking history or history of chemotherapy or radiation. He has no GU surgery or history.   This process is chronic with slow increase in his creatinine over the past months. Of note, patient was referred to our office July 2013 when an ultrasound at [facility] may 2013 showed a 5.6 cm right renal cyst and mild right hydronephrosis. Ultrasound was repeated a month later in June 2013 and now revealed moderate right hydronephrosis with a 6 cm RLP cyst. I reviewed 29 pages of notes.   He does not need urgent intervention. I discussed evaluation with CT and he elects to proceed.

## 2012-04-13 LAB — PROTEIN ELECTROPHORESIS, SERUM
Gamma Globulin: 16.5 % (ref 11.1–18.8)
M-Spike, %: NOT DETECTED g/dL

## 2012-04-13 LAB — RENAL FUNCTION PANEL
BUN: 57 mg/dL — ABNORMAL HIGH (ref 6–23)
CO2: 22 mEq/L (ref 19–32)
Chloride: 100 mEq/L (ref 96–112)
Glucose, Bld: 123 mg/dL — ABNORMAL HIGH (ref 70–99)
Potassium: 4.4 mEq/L (ref 3.5–5.1)

## 2012-04-13 LAB — GLUCOSE, CAPILLARY
Glucose-Capillary: 121 mg/dL — ABNORMAL HIGH (ref 70–99)
Glucose-Capillary: 132 mg/dL — ABNORMAL HIGH (ref 70–99)
Glucose-Capillary: 141 mg/dL — ABNORMAL HIGH (ref 70–99)
Glucose-Capillary: 137 mg/dL — ABNORMAL HIGH (ref 70–99)

## 2012-04-13 LAB — VITAMIN B12: Vitamin B-12: 419 pg/mL (ref 211–911)

## 2012-04-13 MED ORDER — CALCITRIOL 0.25 MCG PO CAPS
0.2500 ug | ORAL_CAPSULE | Freq: Every day | ORAL | Status: DC
  Administered 2012-04-13 – 2012-04-18 (×6): 0.25 ug via ORAL
  Filled 2012-04-13 (×6): qty 1

## 2012-04-13 MED FILL — Medication: Qty: 1 | Status: AC

## 2012-04-13 NOTE — Progress Notes (Signed)
Physical Therapy Treatment Patient Details Name: Tim Herrera MRN: [id number] DOB: [birth year] Today's Date: 04/13/2012 Time: 4098-1191 PT Time Calculation (min): 18 min  PT Assessment / Plan / Recommendation Comments on Treatment Session  Pt. with noted dyspnea with activity.     Follow Up Recommendations  Home health PT;Supervision/Assistance - 24 hour     Does the patient have the potential to tolerate intense rehabilitation     Barriers to Discharge        Equipment Recommendations  Rolling walker with 5" wheels (wife has one. ? if wide enough for pt.)    Recommendations for Other Services    Frequency Min 3X/week   Plan Discharge plan remains appropriate;Frequency remains appropriate    Precautions / Restrictions Precautions Precaution Comments: dyspnea   Pertinent Vitals/Pain Dyspnea 3/4    Mobility  Transfers Sit to Stand: 5: Supervision;With armrests;From chair/3-in-1 Stand to Sit: To chair/3-in-1;With upper extremity assist;6: Modified independent (Device/Increase time) Ambulation/Gait Ambulation/Gait Assistance: 4: Min guard Ambulation Distance (Feet): 100 Feet Assistive device: Rolling walker Ambulation/Gait Assistance Details: slow speed Gait Pattern: Wide base of support;Step-through pattern;Decreased stride length    Exercises     PT Diagnosis:    PT Problem List:   PT Treatment Interventions:     PT Goals Acute Rehab PT Goals Pt will go Sit to Stand: with modified independence PT Goal: Sit to Stand - Progress: Progressing toward goal Pt will go Stand to Sit: with modified independence PT Goal: Stand to Sit - Progress: Met Pt will Ambulate: >150 feet;with modified independence;with least restrictive assistive device PT Goal: Ambulate - Progress: Progressing toward goal  Visit Information  Last PT Received On: 04/13/12 Assistance Needed: +1    Subjective Data  Subjective: my feet and knees are tight, swollen   Cognition  Overall Cognitive  Status: Appears within functional limits for tasks assessed/performed Arousal/Alertness: Awake/alert Orientation Level: Appears intact for tasks assessed Behavior During Session: [facility] for tasks performed    Balance     End of Session PT - End of Session Activity Tolerance: Patient limited by fatigue Patient left: in chair;with call bell/phone within reach Nurse Communication: Mobility status   GP     Rada Hay 04/13/2012, 4:17 PM

## 2012-04-13 NOTE — Progress Notes (Signed)
[facility] PROGRESS NOTE  Tim Herrera ZOX:[id number] DOB: August 12, 1935 DOA: 04/09/2012 PCP: [name], MD  Assessment/Plan: 1. Hypoglycemia--resolved. Secondary to overmedication.  2. Acute systolic/diastolic CHF/NICM--negative fluid balance. Asymptomatic. Diuretics per nephrology. Continue Coreg, statin, ASA, Imdur. Spironolactone stopped secondary to borderline hyperkalemia. Not an ACE-I given AKI. Follow-up with heart failure clinic on discharge. 3. AKI/CKD stage III--stable today. Likely combination of decompensated CHF with ongoing diuresis and hydronephrosis. Urology evaluation appreciated. Management deferred to nephrology and urology. SPEP and UPEP pending per nephrology.  4. Marked right hydronephrosis--may be contributing to progressive kidney disease. Urology consultation appreciated. Stent being considered. 5. Anasarca--secondary to CHF. Daily weights, I/O. 6. Hyperkalemia--resolved. Spironolactone discontinued. 7. Anemia--normocytic. Stable. Follow-up as outpatient. 8. DM type 2--now stabilized. Follow CBG. No meds. HBa1c is 5.6. 9. HTN--follow, continue current medications. Overall improved.  Code Status: full code Family Communication: none present Disposition Plan: home when improved  [name], MD  [facility] Team 6 Pager [phone]. If 8PM-8AM, please contact night-coverage at [email or web address], password [facility] 04/13/2012, 3:53 PM  LOS: 4 days   Brief narrative: 76 y/o man with h/o combined CHF, DM, HTN presented with low blood sugar and shortness of breath. In the beginning of September became diaphoretic and shaky and was found to be hyperglycemic. His PCP had asked him to cut his Amaryl in half. When he refilled his latest prescription, about 2 weeks ago, he forgot to cut them in half and has been taking the entire pill. The day before admission, EMS was called to his house as he again became diaphoretic and confused. Was found to have a sugar in the  40s. Was given D50 but was not transported to the ED. This recurred again on the day of admission.   Consultants:  Nephrology  OT--no follow-up needed.  PT--HH PT.  Procedures:  PICC 10/28 >>  HPI/Subjective: Feels fine, no complaints.  Objective: Filed Vitals:   04/12/12 2109 04/13/12 0500 04/13/12 0623 04/13/12 1418  BP: 153/78  148/80 134/60  Pulse: 84  88 87  Temp: 98.8 F (37.1 C)  98.6 F (37 C) 99.5 F (37.5 C)  TempSrc: Oral  Oral Oral  Resp: 20  22 20   Height:      Weight:  147.1 kg (324 lb 4.8 oz)    SpO2: 100%  99% 99%    Intake/Output Summary (Last 24 hours) at 04/13/12 1553 Last data filed at 04/13/12 1300  Gross per 24 hour  Intake    720 ml  Output   4700 ml  Net  -3980 ml   Filed Weights   04/10/12 1200 04/11/12 0400 04/13/12 0500  Weight: 155.4 kg (342 lb 9.5 oz) 69 kg (152 lb 1.9 oz) 147.1 kg (324 lb 4.8 oz)    Exam:  General:  Appears calm and comfortable Cardiovascular: RRR, no m/r/g. 3+ BLE edema. Telemetry: SR, few PVCs Respiratory: CTA bilaterally, no w/r/r. Normal respiratory effort. Psychiatric: grossly normal mood and affect, speech fluent and appropriate  Data Reviewed: Basic Metabolic Panel:  Lab 04/13/12 8295 04/12/12 0500 04/11/12 1125 04/11/12 0320 04/10/12 0514  NA 136 131* 132* 134* 142  K 4.4 4.2 5.0 5.2* 5.0  CL 100 99 103 105 112  CO2 22 21 20 20 22   GLUCOSE 123* 396* 365* 67* 44*  BUN 57* 51* 48* 50* 45*  CREATININE 2.77* 2.75* 2.77* 2.86* 2.53*  CALCIUM 9.0 8.7 8.1* 8.0* 8.0*  MG -- -- -- -- --  PHOS 4.4 4.1 -- 3.3 --  Liver Function Tests:  Lab 04/13/12 0500 04/12/12 0500 04/09/12 1146  AST -- -- 21  ALT -- -- 23  ALKPHOS -- -- 272*  BILITOT -- -- 0.4  PROT -- -- 7.2  ALBUMIN 2.9* 3.0* 3.4*   CBC:  Lab 04/12/12 0500 04/11/12 0320 04/10/12 0514 04/09/12 1146  WBC 4.7 4.4 5.0 4.5  NEUTROABS -- -- -- --  HGB 8.9* 8.6* 8.8* 9.5*  HCT 27.2* 26.8* 27.4* 29.3*  MCV 86.3 86.2 86.4 86.4  PLT 254 224  219 260     Basename 04/09/12 1146  PROBNP 14183.0*   CBG:  Lab 04/13/12 1252 04/13/12 1125 04/13/12 0740 04/12/12 2112 04/12/12 1730  GLUCAP 141* 132* 121* 175* 177*    Recent Results (from the past 240 hour(s))  MRSA PCR SCREENING     Status: Normal   Collection Time   04/10/12  2:22 PM      Component Value Range Status Comment   MRSA by PCR NEGATIVE  NEGATIVE Final      Studies: Ct Abdomen Pelvis Wo Contrast  04/12/2012  *RADIOLOGY REPORT*  Clinical Data: Right-sided hydronephrosis.  Question obstructing stone or source of obstruction.  CT ABDOMEN AND PELVIS WITHOUT CONTRAST  Technique:  Multidetector CT imaging of the abdomen and pelvis was performed following the standard protocol without intravenous contrast.  Comparison: 04/11/2012 ultrasound.  No comparison CT.  Findings: Marked right-sided hydronephrosis.  This is seen to the level of the right ureteral pelvic junction suggesting ureteral pelvic junction obstruction whether from the stricture or congenital.  Of note is that there is a crossing vessel in this region and which may be important if endoluminal ureterostomy is performed.  No renal or ureteral obstructing calculus is noted.  Diffuse third spacing of fluid most notable subcutaneous region.  No definitive extraluminal bowel inflammatory process or free air.  Evaluation of solid abdominal viscera is limited by lack of IV contrast.  Taking this limitation into account no focal worrisome hepatic, splenic, pancreatic, adrenal or renal lesion.  Mild hyperplasia of the adrenal glands.  No calcified gallstone.  Atherosclerotic type changes aorta and iliac arteries with dilated right iliac artery measuring up to 1.7 cm.  Slightly lobulated appearance of the prostate gland.  Clinical and laboratory correlation recommended.  No gross urinary bladder abnormality.  L5 pars defects with anterior slip of L5 and degenerative changes contribute to spinal stenosis and foraminal narrowing.   Lung bases without worrisome abnormality.  Scattered small lymph nodes.  IMPRESSION: Marked right-sided hydronephrosis.  This is seen to the level of the right ureteral pelvic junction suggesting ureteral pelvic junction obstruction whether from the stricture or congenital.  Of note is that there is a crossing vessel in this region and which may be important if endoluminal ureterostomy is performed.  No renal or ureteral obstructing stone.  Diffuse third spacing of fluid most notable subcutaneous region.  Atherosclerotic type changes aorta and iliac arteries with dilated right iliac artery measuring up to 1.7 cm.  Slightly lobulated appearance of the prostate gland.  L5 pars defects with anterior slip of L5 and degenerative changes contribute to spinal stenosis and foraminal narrowing.   Original Report Authenticated By: Fuller Canada, M.D.     Scheduled Meds:    . aspirin EC  81 mg Oral Daily  . atorvastatin  20 mg Oral Daily  . calcitRIOL  0.25 mcg Oral Daily  . carvedilol  3.125 mg Oral BID WC  . dextrose  1 ampule Intravenous Once  .  ferrous sulfate  325 mg Oral Q breakfast  . furosemide  120 mg Intravenous Q8H  . heparin subcutaneous  5,000 Units Subcutaneous Q8H  . isosorbide mononitrate  30 mg Oral Daily  . sodium chloride  10-40 mL Intracatheter Q12H  . sodium chloride  3 mL Intravenous Q12H   Continuous Infusions:   Principal Problem:  *Hypoglycemia Active Problems:  Chronic systolic congestive heart failure, NYHA class 2  HTN (hypertension)  CKD (chronic kidney disease) stage 3, GFR 30-59 ml/min  Acute on chronic combined systolic and diastolic CHF (congestive heart failure)  DM (diabetes mellitus)  Anemia  Acute on chronic renal failure  Fatty liver  Obesity, Class III, BMI 40-49.9 (morbid obesity)  Hydronephrosis of right kidney     [name], MD  [facility] Team 6 Pager [phone]. If 8PM-8AM, please contact night-coverage at [email or web address], password  [facility] 04/13/2012, 3:53 PM  LOS: 4 days   Time spent: 25 minutes

## 2012-04-13 NOTE — Progress Notes (Signed)
Patient ID: Tim Herrera, male   DOB: 06/29/1935, 76 y.o.   MRN: [id number] S:feels better and wants to get out of the hospital.  "i am not used to be cooped up" O:BP 148/80  Pulse 88  Temp 98.6 F (37 C) (Oral)  Resp 22  Ht 5\' 10"  (1.778 m)  Wt 147.1 kg (324 lb 4.8 oz)  BMI 46.53 kg/m2  SpO2 99%  Intake/Output Summary (Last 24 hours) at 04/13/12 1151 Last data filed at 04/13/12 0859  Gross per 24 hour  Intake   1022 ml  Output   4400 ml  Net  -3378 ml   Intake/Output: I/O last 3 completed shifts: In: 1656 [P.O.:1080; I.V.:390; IV Piggyback:186] Out: 7725 [Urine:7725]  Intake/Output this shift:  Total I/O In: 240 [P.O.:240] Out: 300 [Urine:300] Weight change:  Gen:WD obese AAF in NAD WUJ:WJXBJ HS Resp:CTA YNW:GNFAO Ext:3+tense pretib edema   Lab 04/13/12 0500 04/12/12 0500 04/11/12 1125 04/11/12 0320 04/10/12 0514 04/09/12 1146  NA 136 131* 132* 134* 142 140  K 4.4 4.2 5.0 5.2* 5.0 5.1  CL 100 99 103 105 112 109  CO2 22 21 20 20 22 21   GLUCOSE 123* 396* 365* 67* 44* 43*  BUN 57* 51* 48* 50* 45* 45*  CREATININE 2.77* 2.75* 2.77* 2.86* 2.53* 2.28*  ALBUMIN 2.9* 3.0* -- -- -- 3.4*  CALCIUM 9.0 8.7 8.1* 8.0* 8.0* 8.8  PHOS 4.4 4.1 -- 3.3 -- --  AST -- -- -- -- -- 21  ALT -- -- -- -- -- 23   Liver Function Tests:  Lab 04/13/12 0500 04/12/12 0500 04/09/12 1146  AST -- -- 21  ALT -- -- 23  ALKPHOS -- -- 272*  BILITOT -- -- 0.4  PROT -- -- 7.2  ALBUMIN 2.9* 3.0* 3.4*   No results found for this basename: LIPASE:3,AMYLASE:3 in the last 168 hours No results found for this basename: AMMONIA:3 in the last 168 hours CBC:  Lab 04/12/12 0500 04/11/12 0320 04/10/12 0514 04/09/12 1146  WBC 4.7 4.4 5.0 --  NEUTROABS -- -- -- --  HGB 8.9* 8.6* 8.8* --  HCT 27.2* 26.8* 27.4* --  MCV 86.3 86.2 86.4 86.4  PLT 254 224 219 --   Cardiac Enzymes: No results found for this basename: CKTOTAL:5,CKMB:5,CKMBINDEX:5,TROPONINI:5 in the last 168 hours CBG:  Lab 04/13/12 0740  04/12/12 2112 04/12/12 1730 04/12/12 1154 04/12/12 0846  GLUCAP 121* 175* 177* 145* 215*    Iron Studies:  Basename 04/12/12 0500  IRON 16*  TIBC 242  TRANSFERRIN --  FERRITIN 172   Studies/Results: Ct Abdomen Pelvis Wo Contrast  04/12/2012  *RADIOLOGY REPORT*  Clinical Data: Right-sided hydronephrosis.  Question obstructing stone or source of obstruction.  CT ABDOMEN AND PELVIS WITHOUT CONTRAST  Technique:  Multidetector CT imaging of the abdomen and pelvis was performed following the standard protocol without intravenous contrast.  Comparison: 04/11/2012 ultrasound.  No comparison CT.  Findings: Marked right-sided hydronephrosis.  This is seen to the level of the right ureteral pelvic junction suggesting ureteral pelvic junction obstruction whether from the stricture or congenital.  Of note is that there is a crossing vessel in this region and which may be important if endoluminal ureterostomy is performed.  No renal or ureteral obstructing calculus is noted.  Diffuse third spacing of fluid most notable subcutaneous region.  No definitive extraluminal bowel inflammatory process or free air.  Evaluation of solid abdominal viscera is limited by lack of IV contrast.  Taking this limitation into account no focal  worrisome hepatic, splenic, pancreatic, adrenal or renal lesion.  Mild hyperplasia of the adrenal glands.  No calcified gallstone.  Atherosclerotic type changes aorta and iliac arteries with dilated right iliac artery measuring up to 1.7 cm.  Slightly lobulated appearance of the prostate gland.  Clinical and laboratory correlation recommended.  No gross urinary bladder abnormality.  L5 pars defects with anterior slip of L5 and degenerative changes contribute to spinal stenosis and foraminal narrowing.  Lung bases without worrisome abnormality.  Scattered small lymph nodes.  IMPRESSION: Marked right-sided hydronephrosis.  This is seen to the level of the right ureteral pelvic junction suggesting  ureteral pelvic junction obstruction whether from the stricture or congenital.  Of note is that there is a crossing vessel in this region and which may be important if endoluminal ureterostomy is performed.  No renal or ureteral obstructing stone.  Diffuse third spacing of fluid most notable subcutaneous region.  Atherosclerotic type changes aorta and iliac arteries with dilated right iliac artery measuring up to 1.7 cm.  Slightly lobulated appearance of the prostate gland.  L5 pars defects with anterior slip of L5 and degenerative changes contribute to spinal stenosis and foraminal narrowing.   Original Report Authenticated By: Fuller Canada, M.D.    US Renal  04/11/2012  *RADIOLOGY REPORT*  Clinical Data: Renal insufficiency.  Hypertension and diabetes  RENAL/URINARY TRACT ULTRASOUND COMPLETE  Comparison:  None.  Findings:  Right Kidney:  15.7 cm in length.  There is marked hydronephrosis on the right.  Cortex appears thinned which may indicate chronic obstruction.  Suboptimal images of the right kidney due to body habitus  Left Kidney:  14.4 cm.  Negative for obstruction.  13 mm cyst.  Bladder:  Urinary bladder is filled without mass.  Ureteral jets are not identified.  Examination is significantly limited by body habitus  IMPRESSION: Marked hydronephrosis on the right.  No obstruction of the left kidney.   Original Report Authenticated By: Camelia Phenes, M.D.       . aspirin EC  81 mg Oral Daily  . atorvastatin  20 mg Oral Daily  . carvedilol  3.125 mg Oral BID WC  . dextrose  1 ampule Intravenous Once  . ferrous sulfate  325 mg Oral Q breakfast  . ferumoxytol  1,020 mg Intravenous Once  . furosemide  120 mg Intravenous Q8H  . heparin subcutaneous  5,000 Units Subcutaneous Q8H  . isosorbide mononitrate  30 mg Oral Daily  . sodium chloride  10-40 mL Intracatheter Q12H  . sodium chloride  3 mL Intravenous Q12H    BMET    Component Value Date/Time   NA 136 04/13/2012 0500   K 4.4  04/13/2012 0500   CL 100 04/13/2012 0500   CO2 22 04/13/2012 0500   GLUCOSE 123* 04/13/2012 0500   BUN 57* 04/13/2012 0500   CREATININE 2.77* 04/13/2012 0500   CALCIUM 9.0 04/13/2012 0500   GFRNONAA 21* 04/13/2012 0500   GFRAA 24* 04/13/2012 0500   CBC    Component Value Date/Time   WBC 4.7 04/12/2012 0500   RBC 3.15* 04/12/2012 0500   HGB 8.9* 04/12/2012 0500   HCT 27.2* 04/12/2012 0500   PLT 254 04/12/2012 0500   MCV 86.3 04/12/2012 0500   MCH 28.3 04/12/2012 0500   MCHC 32.7 04/12/2012 0500   RDW 15.3 04/12/2012 0500   LYMPHSABS 0.8 10/27/2010 0938   MONOABS 0.6 10/27/2010 0938   EOSABS 0.6 10/27/2010 0938   BASOSABS 0.0 10/27/2010 9147  Assessment/Plan:  1. AKI/CKD- marked hydronephrosis of right kidney with renal cortex thinning. May be chronic but likely contributing to the AKI/CKD. Agree with Urology evaluation. Overall likely combination of decompensated CHF with ongoing diuresis and hydronephrosis.  1. SPEP and UPEP pending as he has not had a workup for his progressive CKD.  2. Did well with increase of Lasix to 120 q8. Continue to follow with IV Lasix for another 24 hours and then try to change to po 3. Since he will require at least another 48 hours due to massive volume overload and need for IV diuresis, I strongly encouraged Mr. [place]ville to have the cysto/stent done during this hospitalization to help prevent further injury to his right kidney as he already has significant disease at baseline.  Would hopefully have stent placed today/tomorrow per Urology.  Sooner hydro is relieved, more likely to salvage nephron mass. 2. Anasarca- likely combo of decompensated systolic and diastolic CHF, but also with h/o fatty liver disease. Cont with diuresis. -3L over last 24 hours. 3. Hyperkalemia- agree with discontinuation of K supplements and spironalactone (pt also with evidence of gynecomastia) 4. Morbid obesity- recommend dietician consult for weight loss, as well as CHF,  diabetes and CKD education. 5. Anemia- presumably due to chronic disease but will check iron stores, SPEP, UPEP as above. 6. DM with hypoglycemia- per primary svc 7. Right-sided CHF- high pretest probability of OSA due to obesity hypoventillation syndrome, so CPAP may be helpful with diuresis. Need to limit intake 8. Iron deficiency- s/p Feraheme and follow. 9. HTN- stable 10. Dispo- still requiring IV diuresis so not stable for D/C to home at this time  Braylee Lal A

## 2012-04-13 NOTE — Progress Notes (Addendum)
Subjective: Patient reports he feels much better today. No specific complaints. He ate his breakfast and is watching TV.  Objective: Vital signs in last 24 hours: Temp:  [98.6 F (37 C)-98.8 F (37.1 C)] 98.6 F (37 C) (10/31 8657) Pulse Rate:  [78-88] 88  (10/31 0623) Resp:  [18-22] 22  (10/31 0623) BP: (127-153)/(65-80) 148/80 mmHg (10/31 0623) SpO2:  [99 %-100 %] 99 % (10/31 0623) Weight:  [147.1 kg (324 lb 4.8 oz)] 147.1 kg (324 lb 4.8 oz) (10/31 0500)  Intake/Output from previous day: 10/30 0701 - 10/31 0700 In: 1172 [P.O.:1080; I.V.:30; IV Piggyback:62] Out: 5425 [Urine:5425] Intake/Output this shift: Total I/O In: 240 [P.O.:240] Out: 300 [Urine:300]  Physical Exam:  No acute distress Alert and oriented x3 Abdomen soft and nontender  Lab Results:  Basename 04/12/12 0500 04/11/12 0320  HGB 8.9* 8.6*  HCT 27.2* 26.8*   BMET  Basename 04/13/12 0500 04/12/12 0500  NA 136 131*  K 4.4 4.2  CL 100 99  CO2 22 21  GLUCOSE 123* 396*  BUN 57* 51*  CREATININE 2.77* 2.75*  CALCIUM 9.0 8.7    Basename 04/11/12 1400  LABPT --  INR 1.13   No results found for this basename: LABURIN:1 in the last 72 hours Results for orders placed during the hospital encounter of 04/09/12  MRSA PCR SCREENING     Status: Normal   Collection Time   04/10/12  2:22 PM      Component Value Range Status Comment   MRSA by PCR NEGATIVE  NEGATIVE Final     Studies/Results: Ct Abdomen Pelvis Wo Contrast  04/12/2012  *RADIOLOGY REPORT*  Clinical Data: Right-sided hydronephrosis.  Question obstructing stone or source of obstruction.  CT ABDOMEN AND PELVIS WITHOUT CONTRAST  Technique:  Multidetector CT imaging of the abdomen and pelvis was performed following the standard protocol without intravenous contrast.  Comparison: 04/11/2012 ultrasound.  No comparison CT.  Findings: Marked right-sided hydronephrosis.  This is seen to the level of the right ureteral pelvic junction suggesting  ureteral pelvic junction obstruction whether from the stricture or congenital.  Of note is that there is a crossing vessel in this region and which may be important if endoluminal ureterostomy is performed.  No renal or ureteral obstructing calculus is noted.  Diffuse third spacing of fluid most notable subcutaneous region.  No definitive extraluminal bowel inflammatory process or free air.  Evaluation of solid abdominal viscera is limited by lack of IV contrast.  Taking this limitation into account no focal worrisome hepatic, splenic, pancreatic, adrenal or renal lesion.  Mild hyperplasia of the adrenal glands.  No calcified gallstone.  Atherosclerotic type changes aorta and iliac arteries with dilated right iliac artery measuring up to 1.7 cm.  Slightly lobulated appearance of the prostate gland.  Clinical and laboratory correlation recommended.  No gross urinary bladder abnormality.  L5 pars defects with anterior slip of L5 and degenerative changes contribute to spinal stenosis and foraminal narrowing.  Lung bases without worrisome abnormality.  Scattered small lymph nodes.  IMPRESSION: Marked right-sided hydronephrosis.  This is seen to the level of the right ureteral pelvic junction suggesting ureteral pelvic junction obstruction whether from the stricture or congenital.  Of note is that there is a crossing vessel in this region and which may be important if endoluminal ureterostomy is performed.  No renal or ureteral obstructing stone.  Diffuse third spacing of fluid most notable subcutaneous region.  Atherosclerotic type changes aorta and iliac arteries with dilated right iliac  artery measuring up to 1.7 cm.  Slightly lobulated appearance of the prostate gland.  L5 pars defects with anterior slip of L5 and degenerative changes contribute to spinal stenosis and foraminal narrowing.   Original Report Authenticated By: Fuller Canada, M.D.    US Renal  04/11/2012  *RADIOLOGY REPORT*  Clinical Data: Renal  insufficiency.  Hypertension and diabetes  RENAL/URINARY TRACT ULTRASOUND COMPLETE  Comparison:  None.  Findings:  Right Kidney:  15.7 cm in length.  There is marked hydronephrosis on the right.  Cortex appears thinned which may indicate chronic obstruction.  Suboptimal images of the right kidney due to body habitus  Left Kidney:  14.4 cm.  Negative for obstruction.  13 mm cyst.  Bladder:  Urinary bladder is filled without mass.  Ureteral jets are not identified.  Examination is significantly limited by body habitus  IMPRESSION: Marked hydronephrosis on the right.  No obstruction of the left kidney.   Original Report Authenticated By: Camelia Phenes, M.D.     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 I discussed the findings with the patient and drew him a picture of the anatomy involving the kidney, renal pelvis, ureter, bladder and urethra. We went over the nature risks and benefits of continued surveillance including atrophy and loss of the right kidney. We discussed proceeding with cystoscopy right retrograde pyelogram and right ureteral stent placement to determine the long term function of the right kidney with a renal scan. Also to determine if his creatinine improves with the right kidney stented. We discussed renal scan now but I'm not sure how much information it would add. All questions answered. He would like to consider stent placement. If he would like to proceed I'll have it scheduled. From my point of view he does not need to remain in the hospital as it can be done as an outpatient. I can also see in the office in the next week or two to discuss treatment if he is unsure about a ureteral stent.   I would like nephrology opinion if they feel a ureteral stent is a worthwhile attempt to improve his overall renal function, as his left kidney certainly has some dysfunction as well.   His Cr hopefully is stabilizing. His UOP is good.    LOS: 4 days   Antony Haste 04/13/2012, 9:37 AM

## 2012-04-14 ENCOUNTER — Encounter (HOSPITAL_COMMUNITY): Payer: Self-pay

## 2012-04-14 ENCOUNTER — Inpatient Hospital Stay (HOSPITAL_COMMUNITY): Payer: Medicare Other

## 2012-04-14 ENCOUNTER — Other Ambulatory Visit: Payer: Self-pay | Admitting: Urology

## 2012-04-14 LAB — RENAL FUNCTION PANEL
Calcium: 9.1 mg/dL (ref 8.4–10.5)
Creatinine, Ser: 3.03 mg/dL — ABNORMAL HIGH (ref 0.50–1.35)
GFR calc Af Amer: 22 mL/min — ABNORMAL LOW (ref >90)
GFR calc non Af Amer: 19 mL/min — ABNORMAL LOW (ref >90)
Glucose, Bld: 100 mg/dL — ABNORMAL HIGH (ref 70–99)
Phosphorus: 5 mg/dL — ABNORMAL HIGH (ref 2.3–4.6)
Sodium: 137 mEq/L (ref 135–145)

## 2012-04-14 LAB — PROTEIN, URINE, 24 HOUR
Protein, 24H Urine: 13500 mg/d — ABNORMAL HIGH (ref 50–100)
Protein, Urine: 27 mg/dL
Urine Total Volume-UPROT: 50000 mL

## 2012-04-14 LAB — GLUCOSE, CAPILLARY: Glucose-Capillary: 117 mg/dL — ABNORMAL HIGH (ref 70–99)

## 2012-04-14 LAB — IMMUNOFIXATION ELECTROPHORESIS: Immunofix Electr Int: 0

## 2012-04-14 LAB — SURGICAL PCR SCREEN: Staphylococcus aureus: NEGATIVE

## 2012-04-14 MED ORDER — FUROSEMIDE 10 MG/ML IJ SOLN
80.0000 mg | Freq: Three times a day (TID) | INTRAMUSCULAR | Status: DC
  Administered 2012-04-14 – 2012-04-16 (×6): 80 mg via INTRAVENOUS
  Filled 2012-04-14 (×9): qty 8

## 2012-04-14 MED ORDER — DARBEPOETIN ALFA-POLYSORBATE 100 MCG/0.5ML IJ SOLN
100.0000 ug | INTRAMUSCULAR | Status: DC
  Administered 2012-04-14: 100 ug via SUBCUTANEOUS
  Filled 2012-04-14: qty 0.5

## 2012-04-14 MED ORDER — FUROSEMIDE 10 MG/ML IJ SOLN
60.0000 mg | Freq: Once | INTRAMUSCULAR | Status: AC
  Administered 2012-04-14: 60 mg via INTRAVENOUS
  Filled 2012-04-14: qty 6

## 2012-04-14 MED ORDER — TECHNETIUM TC 99M MERTIATIDE
13.9000 | Freq: Once | INTRAVENOUS | Status: AC | PRN
  Administered 2012-04-14: 13.9 via INTRAVENOUS

## 2012-04-14 MED ORDER — DEXTROSE 5 % IV SOLN
3.0000 g | INTRAVENOUS | Status: DC
  Filled 2012-04-14: qty 3000

## 2012-04-14 NOTE — Progress Notes (Signed)
Occupational Therapy Treatment Patient Details Name: Tim Herrera MRN: [id number] DOB: Apr 15, 1936 Today's Date: 04/14/2012 Time: 4098-1191 OT Time Calculation (min): 19 min  OT Assessment / Plan / Recommendation Comments on Treatment Session      Follow Up Recommendations  No OT follow up    Barriers to Discharge       Equipment Recommendations  Rolling walker with 5" wheels    Recommendations for Other Services    Frequency Min 2X/week   Plan      Precautions / Restrictions Precautions Precautions: None Restrictions Weight Bearing Restrictions: No   Pertinent Vitals/Pain No pain    ADL  Toilet Transfer: Performed;Supervision/safety (IV pole) Tub/Shower Transfer: Simulated;Min guard (tub simulated with grab bar) Tub/Shower Transfer Method: Science writer:  (pt does not have seat) Transfers/Ambulation Related to ADLs: Pt initiated walking back to chair without calling; asked him to call for someone to be nearby.  Dyspnea 2/4 with activity ADL Comments: Gave energy conservation sheets and reviewed with pt.      OT Diagnosis:    OT Problem List:   OT Treatment Interventions:     OT Goals ADL Goals Pt Will Transfer to Toilet: with supervision;Ambulation;Comfort height toilet ADL Goal: Toilet Transfer - Progress: Progressing toward goals Pt Will Perform Tub/Shower Transfer: Tub transfer;Ambulation;with min assist ADL Goal: Tub/Shower Transfer - Progress: Met Miscellaneous OT Goals Miscellaneous OT Goal #1: Pt will verbalize 3 energy conservation strategies OT Goal: Miscellaneous Goal #1 - Progress: Progressing toward goals Miscellaneous OT Goal #2: Pt will retrieve adls supplies from room with min guard  OT Goal: Miscellaneous Goal #2 - Progress: Goal set today  Visit Information  Last OT Received On: 04/14/12 Assistance Needed: +1    Subjective Data      Prior Functioning       Cognition  Overall Cognitive Status: Appears within  functional limits for tasks assessed/performed Arousal/Alertness: Awake/alert Orientation Level: Appears intact for tasks assessed Behavior During Session: [facility] for tasks performed Cognition - Other Comments: Pt ambulated by himself: requested he call staff for safety    Mobility  Shoulder Instructions Transfers Sit to Stand: 5: Supervision;With armrests;From chair/3-in-1       Exercises      Balance     End of Session OT - End of Session Activity Tolerance: Patient tolerated treatment well Patient left: in chair;with call bell/phone within reach  GO     Aynslee Mulhall 04/14/2012, 2:10 PM Marica Otter, OTR/L [phone] 04/14/2012

## 2012-04-14 NOTE — Progress Notes (Signed)
Patient ID: Tim Herrera, male   DOB: 1935/11/17, 76 y.o.   MRN: [id number] S:feels fine O:BP 134/60  Pulse 85  Temp 98.5 F (36.9 C) (Oral)  Resp 20  Ht 5\' 10"  (1.778 m)  Wt 143.518 kg (316 lb 6.4 oz)  BMI 45.40 kg/m2  SpO2 97%  Intake/Output Summary (Last 24 hours) at 04/14/12 1117 Last data filed at 04/14/12 0900  Gross per 24 hour  Intake    840 ml  Output   3626 ml  Net  -2786 ml   Intake/Output: I/O last 3 completed shifts: In: 720 [P.O.:720] Out: 6151 [Urine:6150; Stool:1]  Intake/Output this shift:  Total I/O In: 360 [P.O.:360] Out: 475 [Urine:475] Weight change: -3.582 kg (-7 lb 14.4 oz) Gen:WD WN obese AAM in NAD WUJ:WJXBJ HS,no rub Resp:CTA YNW:GNFAO Ext:3+ edema   Lab 04/14/12 0419 04/13/12 0500 04/12/12 0500 04/11/12 1125 04/11/12 0320 04/10/12 0514 04/09/12 1146  NA 137 136 131* 132* 134* 142 140  K 4.3 4.4 4.2 5.0 5.2* 5.0 5.1  CL 100 100 99 103 105 112 109  CO2 25 22 21 20 20 22 21   GLUCOSE 100* 123* 396* 365* 67* 44* 43*  BUN 66* 57* 51* 48* 50* 45* 45*  CREATININE 3.03* 2.77* 2.75* 2.77* 2.86* 2.53* 2.28*  ALBUMIN 3.0* 2.9* 3.0* -- -- -- 3.4*  CALCIUM 9.1 9.0 8.7 8.1* 8.0* 8.0* 8.8  PHOS 5.0* 4.4 4.1 -- 3.3 -- --  AST -- -- -- -- -- -- 21  ALT -- -- -- -- -- -- 23   Liver Function Tests:  Lab 04/14/12 0419 04/13/12 0500 04/12/12 0500 04/09/12 1146  AST -- -- -- 21  ALT -- -- -- 23  ALKPHOS -- -- -- 272*  BILITOT -- -- -- 0.4  PROT -- -- -- 7.2  ALBUMIN 3.0* 2.9* 3.0* --   No results found for this basename: LIPASE:3,AMYLASE:3 in the last 168 hours No results found for this basename: AMMONIA:3 in the last 168 hours CBC:  Lab 04/12/12 0500 04/11/12 0320 04/10/12 0514 04/09/12 1146  WBC 4.7 4.4 5.0 --  NEUTROABS -- -- -- --  HGB 8.9* 8.6* 8.8* --  HCT 27.2* 26.8* 27.4* --  MCV 86.3 86.2 86.4 86.4  PLT 254 224 219 --   Cardiac Enzymes: No results found for this basename: CKTOTAL:5,CKMB:5,CKMBINDEX:5,TROPONINI:5 in the last 168  hours CBG:  Lab 04/14/12 0746 04/13/12 2119 04/13/12 1841 04/13/12 1252 04/13/12 1125  GLUCAP 117* 137* 160* 141* 132*    Iron Studies:  Basename 04/12/12 0500  IRON 16*  TIBC 242  TRANSFERRIN --  FERRITIN 172   Studies/Results: Ct Abdomen Pelvis Wo Contrast  04/12/2012  *RADIOLOGY REPORT*  Clinical Data: Right-sided hydronephrosis.  Question obstructing stone or source of obstruction.  CT ABDOMEN AND PELVIS WITHOUT CONTRAST  Technique:  Multidetector CT imaging of the abdomen and pelvis was performed following the standard protocol without intravenous contrast.  Comparison: 04/11/2012 ultrasound.  No comparison CT.  Findings: Marked right-sided hydronephrosis.  This is seen to the level of the right ureteral pelvic junction suggesting ureteral pelvic junction obstruction whether from the stricture or congenital.  Of note is that there is a crossing vessel in this region and which may be important if endoluminal ureterostomy is performed.  No renal or ureteral obstructing calculus is noted.  Diffuse third spacing of fluid most notable subcutaneous region.  No definitive extraluminal bowel inflammatory process or free air.  Evaluation of solid abdominal viscera is limited by lack of  IV contrast.  Taking this limitation into account no focal worrisome hepatic, splenic, pancreatic, adrenal or renal lesion.  Mild hyperplasia of the adrenal glands.  No calcified gallstone.  Atherosclerotic type changes aorta and iliac arteries with dilated right iliac artery measuring up to 1.7 cm.  Slightly lobulated appearance of the prostate gland.  Clinical and laboratory correlation recommended.  No gross urinary bladder abnormality.  L5 pars defects with anterior slip of L5 and degenerative changes contribute to spinal stenosis and foraminal narrowing.  Lung bases without worrisome abnormality.  Scattered small lymph nodes.  IMPRESSION: Marked right-sided hydronephrosis.  This is seen to the level of the right  ureteral pelvic junction suggesting ureteral pelvic junction obstruction whether from the stricture or congenital.  Of note is that there is a crossing vessel in this region and which may be important if endoluminal ureterostomy is performed.  No renal or ureteral obstructing stone.  Diffuse third spacing of fluid most notable subcutaneous region.  Atherosclerotic type changes aorta and iliac arteries with dilated right iliac artery measuring up to 1.7 cm.  Slightly lobulated appearance of the prostate gland.  L5 pars defects with anterior slip of L5 and degenerative changes contribute to spinal stenosis and foraminal narrowing.   Original Report Authenticated By: Fuller Canada, M.D.       . aspirin EC  81 mg Oral Daily  . atorvastatin  20 mg Oral Daily  . calcitRIOL  0.25 mcg Oral Daily  . carvedilol  3.125 mg Oral BID WC  . ferrous sulfate  325 mg Oral Q breakfast  . furosemide  120 mg Intravenous Q8H  . furosemide  60 mg Intravenous Once  . heparin subcutaneous  5,000 Units Subcutaneous Q8H  . isosorbide mononitrate  30 mg Oral Daily  . sodium chloride  10-40 mL Intracatheter Q12H  . sodium chloride  3 mL Intravenous Q12H  . DISCONTD: dextrose  1 ampule Intravenous Once    BMET    Component Value Date/Time   NA 137 04/14/2012 0419   K 4.3 04/14/2012 0419   CL 100 04/14/2012 0419   CO2 25 04/14/2012 0419   GLUCOSE 100* 04/14/2012 0419   BUN 66* 04/14/2012 0419   CREATININE 3.03* 04/14/2012 0419   CALCIUM 9.1 04/14/2012 0419   GFRNONAA 19* 04/14/2012 0419   GFRAA 22* 04/14/2012 0419   CBC    Component Value Date/Time   WBC 4.7 04/12/2012 0500   RBC 3.15* 04/12/2012 0500   HGB 8.9* 04/12/2012 0500   HCT 27.2* 04/12/2012 0500   PLT 254 04/12/2012 0500   MCV 86.3 04/12/2012 0500   MCH 28.3 04/12/2012 0500   MCHC 32.7 04/12/2012 0500   RDW 15.3 04/12/2012 0500   LYMPHSABS 0.8 10/27/2010 0938   MONOABS 0.6 10/27/2010 0938   EOSABS 0.6 10/27/2010 0938   BASOSABS 0.0 10/27/2010 0938     Assessment/Plan:  1. AKI/CKD- marked hydronephrosis of right kidney with renal cortex thinning. May be chronic but likely contributing to the AKI/CKD. Agree with Urology evaluation. Overall likely combination of decompensated CHF with ongoing diuresis and hydronephrosis.  1. SPEP and UPEP pending as he has not had a workup for his progressive CKD.  2. Creatinine climbing. Will decrease lasix dose and follow. 3. Since he will require at least another 48 hours due to massive volume overload and need for IV diuresis, I strongly encouraged Mr. Walkowski to have the cysto/stent done during this hospitalization to help prevent further injury to his right kidney as  he already has significant disease at baseline. Would hopefully have stent placed today/tomorrow per Urology. Sooner hydro is relieved, more likely to salvage nephron mass.  2. Anasarca- likely combo of decompensated systolic and diastolic CHF, but also with h/o fatty liver disease. Cont with diuresis. -3L over last 24 hours.  Decrease lasix as above 3. Hyperkalemia- agree with discontinuation of K supplements and spironalactone (pt also with evidence of gynecomastia) 4. Morbid obesity- recommend dietician consult for weight loss, as well as CHF, diabetes and CKD education. 5. Anemia- presumably due to chronic disease but will check iron stores, SPEP, UPEP as above.  S/p IV iron and will start epo 6. DM with hypoglycemia- per primary svc 7. Right-sided CHF- high pretest probability of OSA due to obesity hypoventillation syndrome, so CPAP may be helpful with diuresis. Need to limit intake 8. Iron deficiency- s/p Feraheme and follow. 9. HTN- stable 10. Dispo- still requiring IV diuresis so not stable for D/C to home at this time  Guinevere Stephenson A

## 2012-04-14 NOTE — Progress Notes (Signed)
Patient ID: Tim Herrera, male   DOB: [birth year], 76 y.o.   MRN: [id number]  Mag-3 completed showing good renal function on right with high grade obstruction.   Pt on for cystoscopy, right RGP, stent placement tomorrow morning with my associate Dr. Margarita Grizzle. I discussed the patient with Dr. Margarita Grizzle.

## 2012-04-14 NOTE — Progress Notes (Signed)
Patient ID: Tim Herrera, male   DOB: 1936/01/17, 76 y.o.   MRN: [id number]   I went over the patient's Mag-3 results with him. I gave him a the brochure on ureteral stents. We discussed again the nature, risks, benefits and alternatives to cystoscopy, right RGP, right ureteral stent placement. All questions answered. He elects to proceed. I discussed with him Dr. Margarita Grizzle would be doing the procedure in the morning. We discussed the importance of f/u with a stent.

## 2012-04-14 NOTE — Progress Notes (Signed)
Subjective: Patient without complaint.   Objective: Vital signs in last 24 hours: Temp:  [98.5 F (36.9 C)-99.5 F (37.5 C)] 98.5 F (36.9 C) (11/01 0617) Pulse Rate:  [85-94] 85  (11/01 0838) Resp:  [20] 20  (11/01 0617) BP: (134-168)/(60-83) 134/60 mmHg (11/01 0838) SpO2:  [97 %-99 %] 97 % (11/01 0617) Weight:  [143.518 kg (316 lb 6.4 oz)] 143.518 kg (316 lb 6.4 oz) (11/01 0617)  Intake/Output from previous day: 10/31 0701 - 11/01 0700 In: 720 [P.O.:720] Out: 3451 [Urine:3450; Stool:1] Intake/Output this shift: Total I/O In: 360 [P.O.:360] Out: 175 [Urine:175]  Physical Exam:  NAD A&Ox 3  Sitting in chair by bed  Lab Results:  Basename 04/12/12 0500  HGB 8.9*  HCT 27.2*   BMET  Basename 04/14/12 0419 04/13/12 0500  NA 137 136  K 4.3 4.4  CL 100 100  CO2 25 22  GLUCOSE 100* 123*  BUN 66* 57*  CREATININE 3.03* 2.77*  CALCIUM 9.1 9.0    Basename 04/11/12 1400  LABPT --  INR 1.13   No results found for this basename: LABURIN:1 in the last 72 hours Results for orders placed during the hospital encounter of 04/09/12  MRSA PCR SCREENING     Status: Normal   Collection Time   04/10/12  2:22 PM      Component Value Range Status Comment   MRSA by PCR NEGATIVE  NEGATIVE Final     Studies/Results: Ct Abdomen Pelvis Wo Contrast  04/12/2012  *RADIOLOGY REPORT*  Clinical Data: Right-sided hydronephrosis.  Question obstructing stone or source of obstruction.  CT ABDOMEN AND PELVIS WITHOUT CONTRAST  Technique:  Multidetector CT imaging of the abdomen and pelvis was performed following the standard protocol without intravenous contrast.  Comparison: 04/11/2012 ultrasound.  No comparison CT.  Findings: Marked right-sided hydronephrosis.  This is seen to the level of the right ureteral pelvic junction suggesting ureteral pelvic junction obstruction whether from the stricture or congenital.  Of note is that there is a crossing vessel in this region and which may be  important if endoluminal ureterostomy is performed.  No renal or ureteral obstructing calculus is noted.  Diffuse third spacing of fluid most notable subcutaneous region.  No definitive extraluminal bowel inflammatory process or free air.  Evaluation of solid abdominal viscera is limited by lack of IV contrast.  Taking this limitation into account no focal worrisome hepatic, splenic, pancreatic, adrenal or renal lesion.  Mild hyperplasia of the adrenal glands.  No calcified gallstone.  Atherosclerotic type changes aorta and iliac arteries with dilated right iliac artery measuring up to 1.7 cm.  Slightly lobulated appearance of the prostate gland.  Clinical and laboratory correlation recommended.  No gross urinary bladder abnormality.  L5 pars defects with anterior slip of L5 and degenerative changes contribute to spinal stenosis and foraminal narrowing.  Lung bases without worrisome abnormality.  Scattered small lymph nodes.  IMPRESSION: Marked right-sided hydronephrosis.  This is seen to the level of the right ureteral pelvic junction suggesting ureteral pelvic junction obstruction whether from the stricture or congenital.  Of note is that there is a crossing vessel in this region and which may be important if endoluminal ureterostomy is performed.  No renal or ureteral obstructing stone.  Diffuse third spacing of fluid most notable subcutaneous region.  Atherosclerotic type changes aorta and iliac arteries with dilated right iliac artery measuring up to 1.7 cm.  Slightly lobulated appearance of the prostate gland.  L5 pars defects with anterior slip of  L5 and degenerative changes contribute to spinal stenosis and foraminal narrowing.   Original Report Authenticated By: Fuller Canada, M.D.     Assessment/Plan: AKI/CKD - pt making good UOP with IV diuresis, but Cr continues to go up. Nephrology feels a stent may help renal function. I discussed with the patient we have to consider the long-term which may  include a pyeloplasty which is quite a bigger operation than cystoscopy/stent. In considering that I do feel we need a Mag-3 with lasix washout to get some baseline %function of the right kidney and a T1/2 for washout. This will help [place] compare pre- and post-stenting in consideration if the risks of a pyeloplasty outweigh the benefit in the long run. Pt agrees to proceed with scan.   Once we review the Mag-3, myself or Dr. Margarita Grizzle, my on-call associate, will set pt up for cystoscopy, right retrograde, right stent placement.      LOS: 5 days   Antony Haste 04/14/2012, 9:14 AM

## 2012-04-14 NOTE — Progress Notes (Signed)
[facility] PROGRESS NOTE  Tim Herrera YQM:[id number] DOB: 22-Aug-1935 DOA: 04/09/2012 PCP: [name], MD  Assessment/Plan: 1. Acute systolic/diastolic CHF/NICM--negative fluid balance. Asymptomatic. Diuretics per nephrology. Continue Coreg, statin, ASA, Imdur. Spironolactone stopped secondary to borderline hyperkalemia. Not an ACE-I given AKI. Follow-up with heart failure clinic on discharge. 2. AKI/CKD stage III--slowly worsening. Likely combination of decompensated CHF with ongoing diuresis and hydronephrosis. Urology evaluation appreciated. Management deferred to nephrology and urology. SPEP and UPEP pending per nephrology.  3. Marked right hydronephrosis--may be contributing to progressive kidney disease. Urology consultation appreciated. Stent 11/2. 4. Anasarca--secondary to CHF. Daily weights (weight down), I/O. 5. Hyperkalemia--resolved. Spironolactone discontinued. 6. Anemia--normocytic. Stable. Received IV iron, Aranesp. Follow-up as outpatient. 7. DM type 2--now stabilized. Follow CBG. No meds. HBa1c is 5.6.  8. Hypoglycemia--resolved. Secondary to overmedication.  9. HTN--follow, continue current medications. Overall improved.  Code Status: full code Family Communication: none present Disposition Plan: home when improved  Tim Sacks, MD  [facility] Team 6 Pager [phone]. If 8PM-8AM, please contact night-coverage at [email or web address], password [facility] 04/14/2012, 5:41 PM  LOS: 5 days   Brief narrative: 76 y/o man with h/o combined CHF, DM, HTN presented with low blood sugar and shortness of breath. In the beginning of September became diaphoretic and shaky and was found to be hyperglycemic. His PCP had asked him to cut his Amaryl in half. When he refilled his latest prescription, about 2 weeks ago, he forgot to cut them in half and has been taking the entire pill. The day before admission, EMS was called to his house as he again became diaphoretic and confused.  Was found to have a sugar in the 40s. Was given D50 but was not transported to the ED. This recurred again on the day of admission.   Consultants:  Nephrology  OT--no follow-up needed.  PT--HH PT. Rolling walker with 5" wheels   Procedures:  PICC 10/28 >>  HPI/Subjective: Feels fine, no complaints.  Objective: Filed Vitals:   04/14/12 0617 04/14/12 0838 04/14/12 1500 04/14/12 1706  BP: 153/67 134/60 135/63 125/63  Pulse: 88 85 87 85  Temp: 98.5 F (36.9 C)  98.2 F (36.8 C)   TempSrc: Oral  Oral   Resp: 20  20   Height:      Weight: 143.518 kg (316 lb 6.4 oz)     SpO2: 97%  98%     Intake/Output Summary (Last 24 hours) at 04/14/12 1741 Last data filed at 04/14/12 1500  Gross per 24 hour  Intake    840 ml  Output   2886 ml  Net  -2046 ml   Filed Weights   04/11/12 0400 04/13/12 0500 04/14/12 0617  Weight: 69 kg (152 lb 1.9 oz) 147.1 kg (324 lb 4.8 oz) 143.518 kg (316 lb 6.4 oz)    Exam:  General:  Appears calm and comfortable Cardiovascular: RRR, no m/r/g. 3+ BLE edema. Telemetry: SR Respiratory: CTA bilaterally, no w/r/r. Normal respiratory effort. Psychiatric: grossly normal mood and affect, speech fluent and appropriate  Exam current 11/1  Data Reviewed: Basic Metabolic Panel:  Lab 04/14/12 4401 04/13/12 0500 04/12/12 0500 04/11/12 1125 04/11/12 0320  NA 137 136 131* 132* 134*  K 4.3 4.4 4.2 5.0 5.2*  CL 100 100 99 103 105  CO2 25 22 21 20 20   GLUCOSE 100* 123* 396* 365* 67*  BUN 66* 57* 51* 48* 50*  CREATININE 3.03* 2.77* 2.75* 2.77* 2.86*  CALCIUM 9.1 9.0 8.7 8.1* 8.0*  MG -- -- -- -- --  PHOS 5.0* 4.4 4.1 -- 3.3   Liver Function Tests:  Lab 04/14/12 0419 04/13/12 0500 04/12/12 0500 04/09/12 1146  AST -- -- -- 21  ALT -- -- -- 23  ALKPHOS -- -- -- 272*  BILITOT -- -- -- 0.4  PROT -- -- -- 7.2  ALBUMIN 3.0* 2.9* 3.0* 3.4*   CBC:  Lab 04/12/12 0500 04/11/12 0320 04/10/12 0514 04/09/12 1146  WBC 4.7 4.4 5.0 4.5  NEUTROABS -- -- -- --   HGB 8.9* 8.6* 8.8* 9.5*  HCT 27.2* 26.8* 27.4* 29.3*  MCV 86.3 86.2 86.4 86.4  PLT 254 224 219 260     Basename 04/09/12 1146  PROBNP 14183.0*   CBG:  Lab 04/14/12 1658 04/14/12 0746 04/13/12 2119 04/13/12 1841 04/13/12 1252  GLUCAP 107* 117* 137* 160* 141*    Recent Results (from the past 240 hour(s))  MRSA PCR SCREENING     Status: Normal   Collection Time   04/10/12  2:22 PM      Component Value Range Status Comment   MRSA by PCR NEGATIVE  NEGATIVE Final      Studies: Nm Renal Imaging Flow W/pharm  04/14/2012  *RADIOLOGY REPORT*  Clinical Data:  Evaluate right hydronephrosis.  NUCLEAR MEDICINE RENAL SCAN WITH DIURETIC ADMINISTRATION  Technique:  Radionuclide angiographic and sequential renal images were obtained after intravenous injection of radiopharmaceutical. Imaging was continued during slow intravenous injection of Lasix approximately 15 minutes after the start of the examination. 60 mg Lasix was given.  Radiopharmaceutical:  13.9 mCi Tc-2m MAG3  Comparison:  Abdominal CT 03/1931 1013  Findings: There is slightly decreased flow to the right kidney compared to the left.  The differential renal function is 56.5% on the left and 43.5% on the right. There is spontaneous excretion from the left kidney prior to the administration of Lasix.  The T 1/2 left pre Lasix time is 23.5 minutes and post Lasix is 54 minutes.  The T 1/2 right pre Lasix time is 0 and post Lasix is 479.5 minutes.  There is no significant excretion identified from the right upper renal collecting system on this examination.  IMPRESSION: The differential renal function is 56.5% on the left and 43.5% on the right.  High-grade right hydronephrosis and obstruction.   Original Report Authenticated By: Richarda Overlie, M.D.     Scheduled Meds:    . aspirin EC  81 mg Oral Daily  . atorvastatin  20 mg Oral Daily  . calcitRIOL  0.25 mcg Oral Daily  . carvedilol  3.125 mg Oral BID WC  . darbepoetin (ARANESP) injection -  NON-DIALYSIS  100 mcg Subcutaneous Q Fri-1800  . ferrous sulfate  325 mg Oral Q breakfast  . furosemide  60 mg Intravenous Once  . furosemide  80 mg Intravenous Q8H  . heparin subcutaneous  5,000 Units Subcutaneous Q8H  . isosorbide mononitrate  30 mg Oral Daily  . sodium chloride  10-40 mL Intracatheter Q12H  . sodium chloride  3 mL Intravenous Q12H  . DISCONTD: furosemide  120 mg Intravenous Q8H   Continuous Infusions:   Principal Problem:  *Hypoglycemia Active Problems:  Chronic systolic congestive heart failure, NYHA class 2  HTN (hypertension)  CKD (chronic kidney disease) stage 3, GFR 30-59 ml/min  Acute on chronic combined systolic and diastolic CHF (congestive heart failure)  DM (diabetes mellitus)  Anemia  Acute on chronic renal failure  Fatty liver  Obesity, Class III, BMI 40-49.9 (morbid obesity)  Hydronephrosis of right kidney  Tim Sacks, MD  [facility] Team 2 Pager [phone]. If 8PM-8AM, please contact night-coverage at [email or web address], password [facility] 04/14/2012, 5:41 PM  LOS: 5 days   Time spent: 15 minutes

## 2012-04-14 NOTE — Progress Notes (Signed)
Physical Therapy Treatment Patient Details Name: Tim Herrera MRN: [id number] DOB: 05/19/1936 Today's Date: 04/14/2012 Time: 6387-5643 PT Time Calculation (min): 17 min  PT Assessment / Plan / Recommendation Comments on Treatment Session  Pt progressing well with increase amb distance and less c/o dyspnea.  Pt plans to D/C to home.    Follow Up Recommendations  Home health PT     Does the patient have the potential to tolerate intense rehabilitation     Barriers to Discharge        Equipment Recommendations  Rolling walker with 5" wheels    Recommendations for Other Services    Frequency Min 3X/week   Plan Discharge plan remains appropriate    Precautions / Restrictions Precautions Precautions: None Restrictions Weight Bearing Restrictions: No    Pertinent Vitals/Pain C/o both feet swelling    Mobility  Bed Mobility Bed Mobility: Not assessed Details for Bed Mobility Assistance: Pt OOB in recliner  Transfers Transfers: Sit to Stand;Stand to Sit Sit to Stand: 5: Supervision;From chair/3-in-1 Stand to Sit: 5: Supervision;To chair/3-in-1 Details for Transfer Assistance: increased time  Ambulation/Gait Ambulation/Gait Assistance: 5: Supervision Ambulation Distance (Feet): 250 Feet Assistive device: Rolling walker;Other (Comment) (IV pole) Ambulation/Gait Assistance Details: feeling better but mod c/o B LE edema esp feet that makes him feel unsteady. Gait Pattern: Step-through pattern;Wide base of support;Decreased stride length Gait velocity: decreased     PT Goals                                                  progressing    Visit Information  Last PT Received On: 04/14/12 Assistance Needed: +1    Subjective Data      Cognition  Overall Cognitive Status: Appears within functional limits for tasks assessed/performed Arousal/Alertness: Awake/alert Orientation Level: Appears intact for tasks assessed Behavior During Session: [facility] for tasks  performed Cognition - Other Comments: Pt ambulated by himself: requested he call staff for safety    Balance     End of Session PT - End of Session Equipment Utilized During Treatment: Gait belt Activity Tolerance: Patient tolerated treatment well Patient left: in chair;with call bell/phone within reach   Felecia Shelling  PTA [facility]  Acute  Rehab Pager     [phone]

## 2012-04-15 ENCOUNTER — Encounter (HOSPITAL_COMMUNITY): Payer: Self-pay | Admitting: Anesthesiology

## 2012-04-15 ENCOUNTER — Inpatient Hospital Stay (HOSPITAL_COMMUNITY): Payer: Medicare Other | Admitting: Anesthesiology

## 2012-04-15 ENCOUNTER — Encounter (HOSPITAL_COMMUNITY)
Admission: EM | Disposition: A | Discharge status: Home Health | Payer: Self-pay | Source: Home / Self Care | Attending: Family Medicine

## 2012-04-15 HISTORY — PX: CYSTOSCOPY W/ URETERAL STENT PLACEMENT: SHX1429

## 2012-04-15 LAB — RENAL FUNCTION PANEL
Albumin: 2.8 g/dL — ABNORMAL LOW (ref 3.5–5.2)
CO2: 24 mEq/L (ref 19–32)
Calcium: 8.6 mg/dL (ref 8.4–10.5)
Creatinine, Ser: 2.91 mg/dL — ABNORMAL HIGH (ref 0.50–1.35)
GFR calc Af Amer: 23 mL/min — ABNORMAL LOW (ref >90)
GFR calc non Af Amer: 20 mL/min — ABNORMAL LOW (ref >90)
Sodium: 136 mEq/L (ref 135–145)

## 2012-04-15 LAB — GLUCOSE, CAPILLARY: Glucose-Capillary: 162 mg/dL — ABNORMAL HIGH (ref 70–99)

## 2012-04-15 SURGERY — CYSTOSCOPY, WITH RETROGRADE PYELOGRAM AND URETERAL STENT INSERTION
Anesthesia: General | Site: Ureter | Laterality: Right | Wound class: Clean Contaminated

## 2012-04-15 MED ORDER — PROPOFOL 10 MG/ML IV EMUL
INTRAVENOUS | Status: DC | PRN
  Administered 2012-04-15: 250 mg via INTRAVENOUS

## 2012-04-15 MED ORDER — FENTANYL CITRATE 0.05 MG/ML IJ SOLN
INTRAMUSCULAR | Status: DC | PRN
  Administered 2012-04-15 (×2): 25 ug via INTRAVENOUS

## 2012-04-15 MED ORDER — MIDAZOLAM HCL 5 MG/5ML IJ SOLN
INTRAMUSCULAR | Status: DC | PRN
  Administered 2012-04-15: 1 mg via INTRAVENOUS

## 2012-04-15 MED ORDER — IOHEXOL 300 MG/ML  SOLN
INTRAMUSCULAR | Status: DC | PRN
  Administered 2012-04-15: 10 mL via INTRAVENOUS
  Administered 2012-04-15: 20 mL via INTRAVENOUS

## 2012-04-15 MED ORDER — ONDANSETRON HCL 4 MG/2ML IJ SOLN
INTRAMUSCULAR | Status: DC | PRN
  Administered 2012-04-15 (×2): 2 mg via INTRAVENOUS

## 2012-04-15 MED ORDER — LIDOCAINE HCL 2 % EX GEL
CUTANEOUS | Status: DC | PRN
  Administered 2012-04-15: 1

## 2012-04-15 MED ORDER — LIDOCAINE HCL (CARDIAC) 20 MG/ML IV SOLN
INTRAVENOUS | Status: DC | PRN
  Administered 2012-04-15: 50 mg via INTRAVENOUS

## 2012-04-15 MED ORDER — FENTANYL CITRATE 0.05 MG/ML IJ SOLN: 25.0000 ug | INTRAMUSCULAR | Status: DC | PRN

## 2012-04-15 MED ORDER — BELLADONNA ALKALOIDS-OPIUM 16.2-60 MG RE SUPP
RECTAL | Status: DC | PRN
  Administered 2012-04-15: 1 via RECTAL

## 2012-04-15 MED ORDER — PROMETHAZINE HCL 25 MG/ML IJ SOLN: 6.2500 mg | INTRAMUSCULAR | Status: DC | PRN

## 2012-04-15 MED ORDER — SODIUM CHLORIDE 0.9 % IV SOLN
INTRAVENOUS | Status: DC | PRN
  Administered 2012-04-15: 07:00:00 via INTRAVENOUS

## 2012-04-15 MED ORDER — OXYBUTYNIN CHLORIDE 5 MG PO TABS
5.0000 mg | ORAL_TABLET | Freq: Four times a day (QID) | ORAL | Status: DC | PRN
  Administered 2012-04-15: 5 mg via ORAL
  Filled 2012-04-15 (×2): qty 1

## 2012-04-15 MED ORDER — SODIUM CHLORIDE 0.9 % IR SOLN
Status: DC | PRN
  Administered 2012-04-15: 3000 mL via INTRAVESICAL

## 2012-04-15 MED ORDER — CEFAZOLIN SODIUM-DEXTROSE 2-3 GM-% IV SOLR
INTRAVENOUS | Status: DC | PRN
  Administered 2012-04-15: 3 g via INTRAVENOUS

## 2012-04-15 SURGICAL SUPPLY — 15 items
ADAPTER CATH URET PLST 4-6FR (CATHETERS) ×2 IMPLANT
BAG URO CATCHER STRL LF (DRAPE) ×2 IMPLANT
CATH INTERMIT  6FR 70CM (CATHETERS) ×2 IMPLANT
CLOTH BEACON ORANGE TIMEOUT ST (SAFETY) ×2 IMPLANT
DRAPE CAMERA CLOSED 9X96 (DRAPES) ×2 IMPLANT
GLOVE BIOGEL M 7.0 STRL (GLOVE) ×2 IMPLANT
GOWN PREVENTION PLUS XLARGE (GOWN DISPOSABLE) ×2 IMPLANT
GOWN STRL NON-REIN LRG LVL3 (GOWN DISPOSABLE) ×2 IMPLANT
GUIDEWIRE ANG ZIPWIRE 038X150 (WIRE) ×2 IMPLANT
GUIDEWIRE STR DUAL SENSOR (WIRE) ×2 IMPLANT
MANIFOLD NEPTUNE II (INSTRUMENTS) ×2 IMPLANT
PACK CYSTO (CUSTOM PROCEDURE TRAY) ×2 IMPLANT
SCRUB PCMX 4 OZ (MISCELLANEOUS) ×2 IMPLANT
STENT CONTOUR 6FRX26X.038 (STENTS) ×2 IMPLANT
TUBING CONNECTING 10 (TUBING) ×2 IMPLANT

## 2012-04-15 NOTE — Op Note (Signed)
Operative report  Surgery date: 04/15/12  Preoperative diagnosis: Right renal obstruction. Renal failure.  Postoperative diagnosis: Same.   Procedures performed: Cystoscopy Right retrograde pyelogram Right ureter stent placement  Findings: Tortuous right ureter. Inability to completely fill ureter with contrast. Right ureteropelvic junction obstruction.  Complications: None  Estimated blood loss: None  Specimen: None  Drains: None  History present illness: 76 year old male with acute renal insufficiency. Workup revealed obstruction of the right kidney with hydronephrosis. Options were presented the patient and he elected to have a right ureter stent placement in the operating room.  Procedure: After informed consent was obtained and the correct site was marked, the patient was taken to the operating room. He was placed in a supine position, general anesthesia was induced, SCDs were in place and turned on, and IV antibiotics were infused. He was placed in the dorsolithotomy position making sure to pad all per neurovascular pressure points appropriately. A timeout was performed in which the correct patient, surgical site, and procedure were all identified and agreed upon by the team.  Rigid cystoscope was advanced through the urethra into the bladder. The bladder was distended and fully evaluated in a systematic fashion with a 12 and 70 lens. There were no bladder tumors noted. Left ureter orifice was seen to be effluxing clear yellow urine. Attention was turned to the right ureter orifice. This was cannulated with a 5 [place] ureter catheter. I injected contrast and this catheter to obtain a retrograde pyelogram. The right ureter was noted to be tortuous. There were no filling defects, but the ureter did not completely fill out with contrast. Very little contrast was able to pass into the renal collecting system. I advanced a sensor tip wire but this was not able to be advanced into the  renal pelvis. I advanced the ureter catheter over the wire into the proximal ureter. I again injected contrast. The ureter did not fill out completely but contrast did enter the collecting system in the renal pelvis. Since it was removed and angle-tipped Glidewire was required and this was able to be passed into the right renal pelvis. The ureter catheter was placed over the Glidewire into the right renal pelvis. Lumbar was removed and replaced with sensor tip wire. Right ureter stent, 6 x 24 double-J, placed over wire with ease. This was deployed with a good curl in the right renal pelvis and a curl in the bladder on direct visualization. The bladder was drained. 10 cc of lidocaine jelly were placed into the urethra. A belladonna and opium suppository was placed into his rectum.  This completed the procedure. This study was not adequate to evaluate for an obstructing tumor although no filling defects were identified. Dr. Mena Goes will be notified.  Disposition: Patient was taken to the PACU in stable condition. He will be readmitted to the hospital floor.

## 2012-04-15 NOTE — Anesthesia Preprocedure Evaluation (Signed)
Anesthesia Evaluation  Patient identified by MRN, date of birth, ID band Patient awake    Reviewed: Allergy & Precautions, H&P , NPO status , Patient's Chart, lab work & pertinent test results  Airway Mallampati: II TM Distance: <3 FB Neck ROM: Full    Dental No notable dental hx.    Pulmonary neg pulmonary ROS,  breath sounds clear to auscultation  Pulmonary exam normal       Cardiovascular hypertension, + CAD and +CHF Rhythm:Regular Rate:Normal     Neuro/Psych negative neurological ROS  negative psych ROS   GI/Hepatic negative GI ROS, Neg liver ROS,   Endo/Other  negative endocrine ROSdiabetes, Type obesity  Renal/GU Renal InsufficiencyRenal disease  negative genitourinary   Musculoskeletal negative musculoskeletal ROS (+)   Abdominal   Peds negative pediatric ROS (+)  Hematology  (+) Blood dyscrasia, anemia ,   Anesthesia Other Findings   Reproductive/Obstetrics negative OB ROS                           Anesthesia Physical Anesthesia Plan  ASA: III  Anesthesia Plan: General   Post-op Pain Management:    Induction: Intravenous  Airway Management Planned: Oral ETT  Additional Equipment:   Intra-op Plan:   Post-operative Plan: Extubation in OR  Informed Consent: I have reviewed the patients History and Physical, chart, labs and discussed the procedure including the risks, benefits and alternatives for the proposed anesthesia with the patient or authorized representative who has indicated his/her understanding and acceptance.   Dental advisory given  Plan Discussed with: CRNA and Surgeon  Anesthesia Plan Comments:         Anesthesia Quick Evaluation

## 2012-04-15 NOTE — Transfer of Care (Signed)
Immediate Anesthesia Transfer of Care Note  Patient: Tim Herrera  Procedure(s) Performed: Procedure(s) (LRB) with comments: CYSTOSCOPY WITH RETROGRADE PYELOGRAM/URETERAL STENT PLACEMENT (Right) - Cysto/Right Retrograde Pyelogram/Right Ureteral Stent  Patient Location: PACU  Anesthesia Type:General  Level of Consciousness: awake, alert , oriented, patient cooperative and responds to stimulation  Airway & Oxygen Therapy: Patient Spontanous Breathing and Patient connected to face mask oxygen  Post-op Assessment: Report given to PACU RN, Post -op Vital signs reviewed and stable and Patient moving all extremities  Post vital signs: Reviewed and stable  Complications: No apparent anesthesia complications

## 2012-04-15 NOTE — Progress Notes (Signed)
[facility] PROGRESS NOTE  Tim Herrera RUE:[id number] DOB: [birth year] DOA: 04/09/2012 PCP: [name], MD  Assessment/Plan: 1. Acute systolic/diastolic CHF/NICM--continues to improve. Asymptomatic. Diuretics per nephrology. Continue Coreg, statin, ASA, Imdur. Spironolactone stopped secondary to borderline hyperkalemia. Not an ACE-I given AKI. Follow-up with heart failure clinic on discharge. 2. AKI/CKD stage III--slightly better today. Likely combination of decompensated CHF with ongoing diuresis and hydronephrosis. Urology evaluation appreciated. Management deferred to nephrology and urology. SPEP and UPEP negative per nephrology.  3. Marked right hydronephrosis--may be contributing to progressive kidney disease. S/p stent 11/2. 4. Anasarca--improving, secondary to CHF. Daily weights (weight down approximately 15 kg since admission), I/O negative 10.9 liters since admission. 5. Hyperkalemia--resolved. Spironolactone discontinued. 6. Anemia--normocytic. Stable. Received IV iron, Aranesp. Follow-up as outpatient. 7. DM type 2--stable. Follow CBG. No meds. HBa1c is 5.6.  8. Hypoglycemia--resolved. Secondary to overmedication.  9. HTN--follow, continue current medications. Overall improved.  Code Status: full code Family Communication: none present Disposition Plan: home possibly 2 days  [name], MD  [facility] Team 6 Pager [phone]. If 8PM-8AM, please contact night-coverage at [email or web address], password [facility] 04/15/2012, 2:20 PM  LOS: 6 days   Brief narrative: 76 y/o man with h/o combined CHF, DM, HTN presented with low blood sugar and shortness of breath. In the beginning of September became diaphoretic and shaky and was found to be hyperglycemic. His PCP had asked him to cut his Amaryl in half. When he refilled his latest prescription, about 2 weeks ago, he forgot to cut them in half and has been taking the entire pill. The day before admission, EMS was called to his  house as he again became diaphoretic and confused. Was found to have a sugar in the 40s. Was given D50 but was not transported to the ED. This recurred again on the day of admission.   Consultants:  Nephrology  OT--no follow-up needed.  PT--HH PT. Rolling walker with 5" wheels   Procedures:  PICC 10/28 >>  HPI/Subjective: Doing well, swelling in legs decreasing.  Objective: Filed Vitals:   04/15/12 0840 04/15/12 0845 04/15/12 0901 04/15/12 1234  BP:  119/61 135/80 142/71  Pulse: 78 80 81 95  Temp:   98 F (36.7 C) 99 F (37.2 C)  TempSrc:    Oral  Resp: 17 17 14 16   Height:      Weight:      SpO2: 99% 95% 99% 100%    Intake/Output Summary (Last 24 hours) at 04/15/12 1420 Last data filed at 04/15/12 1234  Gross per 24 hour  Intake   1104 ml  Output   3840 ml  Net  -2736 ml   Filed Weights   04/13/12 0500 04/14/12 0617 04/15/12 0428  Weight: 147.1 kg (324 lb 4.8 oz) 143.518 kg (316 lb 6.4 oz) 140.2 kg (309 lb 1.4 oz)    Exam:  General:  Appears calm and comfortable Cardiovascular: RRR, no m/r/g. 3+ BLE edema. Telemetry: SR Respiratory: CTA bilaterally, no w/r/r. Normal respiratory effort. Psychiatric: grossly normal mood and affect, speech fluent and appropriate  Exam current 11/2  Data Reviewed: Basic Metabolic Panel:  Lab 04/15/12 6213 04/14/12 0419 04/13/12 0500 04/12/12 0500 04/11/12 1125 04/11/12 0320  NA 136 137 136 131* 132* --  K 3.9 4.3 4.4 4.2 5.0 --  CL 100 100 100 99 103 --  CO2 24 25 22 21 20  --  GLUCOSE 81 100* 123* 396* 365* --  BUN 74* 66* 57* 51* 48* --  CREATININE 2.91* 3.03* 2.77*  2.75* 2.77* --  CALCIUM 8.6 9.1 9.0 8.7 8.1* --  MG -- -- -- -- -- --  PHOS 5.1* 5.0* 4.4 4.1 -- 3.3   Liver Function Tests:  Lab 04/15/12 0403 04/14/12 0419 04/13/12 0500 04/12/12 0500 04/09/12 1146  AST -- -- -- -- 21  ALT -- -- -- -- 23  ALKPHOS -- -- -- -- 272*  BILITOT -- -- -- -- 0.4  PROT -- -- -- -- 7.2  ALBUMIN 2.8* 3.0* 2.9* 3.0* 3.4*    CBC:  Lab 04/12/12 0500 04/11/12 0320 04/10/12 0514 04/09/12 1146  WBC 4.7 4.4 5.0 4.5  NEUTROABS -- -- -- --  HGB 8.9* 8.6* 8.8* 9.5*  HCT 27.2* 26.8* 27.4* 29.3*  MCV 86.3 86.2 86.4 86.4  PLT 254 224 219 260     Basename 04/09/12 1146  PROBNP 14183.0*   CBG:  Lab 04/15/12 1224 04/15/12 0818 04/14/12 2124 04/14/12 1658 04/14/12 0746  GLUCAP 162* 92 108* 107* 117*    Recent Results (from the past 240 hour(s))  MRSA PCR SCREENING     Status: Normal   Collection Time   04/10/12  2:22 PM      Component Value Range Status Comment   MRSA by PCR NEGATIVE  NEGATIVE Final   SURGICAL PCR SCREEN     Status: Normal   Collection Time   04/14/12  9:53 PM      Component Value Range Status Comment   MRSA, PCR NEGATIVE  NEGATIVE Final    Staphylococcus aureus NEGATIVE  NEGATIVE Final      Studies: Nm Renal Imaging Flow W/pharm  04/14/2012  *RADIOLOGY REPORT*  Clinical Data:  Evaluate right hydronephrosis.  NUCLEAR MEDICINE RENAL SCAN WITH DIURETIC ADMINISTRATION  Technique:  Radionuclide angiographic and sequential renal images were obtained after intravenous injection of radiopharmaceutical. Imaging was continued during slow intravenous injection of Lasix approximately 15 minutes after the start of the examination. 60 mg Lasix was given.  Radiopharmaceutical:  13.9 mCi Tc-48m MAG3  Comparison:  Abdominal CT 03/1931 1013  Findings: There is slightly decreased flow to the right kidney compared to the left.  The differential renal function is 56.5% on the left and 43.5% on the right. There is spontaneous excretion from the left kidney prior to the administration of Lasix.  The T 1/2 left pre Lasix time is 23.5 minutes and post Lasix is 54 minutes.  The T 1/2 right pre Lasix time is 0 and post Lasix is 479.5 minutes.  There is no significant excretion identified from the right upper renal collecting system on this examination.  IMPRESSION: The differential renal function is 56.5% on the left and  43.5% on the right.  High-grade right hydronephrosis and obstruction.   Original Report Authenticated By: Richarda Overlie, M.D.     Scheduled Meds:    . aspirin EC  81 mg Oral Daily  . atorvastatin  20 mg Oral Daily  . calcitRIOL  0.25 mcg Oral Daily  . carvedilol  3.125 mg Oral BID WC  . darbepoetin (ARANESP) injection - NON-DIALYSIS  100 mcg Subcutaneous Q Fri-1800  . ferrous sulfate  325 mg Oral Q breakfast  . furosemide  80 mg Intravenous Q8H  . heparin subcutaneous  5,000 Units Subcutaneous Q8H  . isosorbide mononitrate  30 mg Oral Daily  . sodium chloride  10-40 mL Intracatheter Q12H  . sodium chloride  3 mL Intravenous Q12H  . DISCONTD:  ceFAZolin (ANCEF) IV  3 g Intravenous 60 min Pre-Op  Continuous Infusions:   Principal Problem:  *Hypoglycemia Active Problems:  Chronic systolic congestive heart failure, NYHA class 2  HTN (hypertension)  CKD (chronic kidney disease) stage 3, GFR 30-59 ml/min  Acute on chronic combined systolic and diastolic CHF (congestive heart failure)  DM (diabetes mellitus)  Anemia  Acute on chronic renal failure  Fatty liver  Obesity, Class III, BMI 40-49.9 (morbid obesity)  Hydronephrosis of right kidney     [name], MD  [facility] Team 2 Pager [phone]. If 8PM-8AM, please contact night-coverage at [email or web address], password [facility] 04/15/2012, 2:20 PM  LOS: 6 days   Time spent: 15 minutes

## 2012-04-15 NOTE — Preoperative (Signed)
Beta Blockers   Reason not to administer Beta Blockers:Not Applicable 

## 2012-04-15 NOTE — Anesthesia Postprocedure Evaluation (Signed)
  Anesthesia Post-op Note  Patient: Tim Herrera  Procedure(s) Performed: Procedure(s) (LRB): CYSTOSCOPY WITH RETROGRADE PYELOGRAM/URETERAL STENT PLACEMENT (Right)  Patient Location: PACU  Anesthesia Type: General  Level of Consciousness: awake and alert   Airway and Oxygen Therapy: Patient Spontanous Breathing  Post-op Pain: mild  Post-op Assessment: Post-op Vital signs reviewed, Patient's Cardiovascular Status Stable, Respiratory Function Stable, Patent Airway and No signs of Nausea or vomiting  Post-op Vital Signs: stable  Complications: No apparent anesthesia complications

## 2012-04-15 NOTE — Progress Notes (Signed)
Subjective: No complaints this morning. No acute events overnight.  Objective: Vital signs in last 24 hours: Temp:  [98.2 F (36.8 C)-99.2 F (37.3 C)] 99.2 F (37.3 C) (11/02 0428) Pulse Rate:  [80-89] 89  (11/02 0428) Resp:  [18-20] 18  (11/02 0428) BP: (125-150)/(60-74) 150/70 mmHg (11/02 0428) SpO2:  [95 %-98 %] 98 % (11/02 0428) Weight:  [140.2 kg (309 lb 1.4 oz)] 140.2 kg (309 lb 1.4 oz) (11/02 0428)  Intake/Output from previous day: 11/01 0701 - 11/02 0700 In: 864 [P.O.:840; IV Piggyback:24] Out: 3435 [Urine:3435] Intake/Output this shift: Total I/O In: 24 [IV Piggyback:24] Out: 2200 [Urine:2200]  Physical Exam:  Filed Vitals:   04/15/12 0428  BP: 150/70  Pulse: 89  Temp: 99.2 F (37.3 C)  Resp: 18    Gen: NAD, A&Ox 3  CV: RRR Chest: CTA-B GU: no catheter  Lab Results: No results found for this basename: HGB:3,HCT:3 in the last 72 hours BMET  Basename 04/15/12 0403 04/14/12 0419  NA 136 137  K 3.9 4.3  CL 100 100  CO2 24 25  GLUCOSE 81 100*  BUN 74* 66*  CREATININE 2.91* 3.03*  CALCIUM 8.6 9.1   No results found for this basename: LABPT:3,INR:3 in the last 72 hours No results found for this basename: LABURIN:1 in the last 72 hours Results for orders placed during the hospital encounter of 04/09/12  MRSA PCR SCREENING     Status: Normal   Collection Time   04/10/12  2:22 PM      Component Value Range Status Comment   MRSA by PCR NEGATIVE  NEGATIVE Final   SURGICAL PCR SCREEN     Status: Normal   Collection Time   04/14/12  9:53 PM      Component Value Range Status Comment   MRSA, PCR NEGATIVE  NEGATIVE Final    Staphylococcus aureus NEGATIVE  NEGATIVE Final     Studies/Results: Nm Renal Imaging Flow W/pharm  04/14/2012  *RADIOLOGY REPORT*  Clinical Data:  Evaluate right hydronephrosis.  NUCLEAR MEDICINE RENAL SCAN WITH DIURETIC ADMINISTRATION  Technique:  Radionuclide angiographic and sequential renal images were obtained after  intravenous injection of radiopharmaceutical. Imaging was continued during slow intravenous injection of Lasix approximately 15 minutes after the start of the examination. 60 mg Lasix was given.  Radiopharmaceutical:  13.9 mCi Tc-64m MAG3  Comparison:  Abdominal CT 03/1931 1013  Findings: There is slightly decreased flow to the right kidney compared to the left.  The differential renal function is 56.5% on the left and 43.5% on the right. There is spontaneous excretion from the left kidney prior to the administration of Lasix.  The T 1/2 left pre Lasix time is 23.5 minutes and post Lasix is 54 minutes.  The T 1/2 right pre Lasix time is 0 and post Lasix is 479.5 minutes.  There is no significant excretion identified from the right upper renal collecting system on this examination.  IMPRESSION: The differential renal function is 56.5% on the left and 43.5% on the right.  High-grade right hydronephrosis and obstruction.   Original Report Authenticated By: Richarda Overlie, M.D.     Assessment/Plan: AKI/CKD - Lasix renogram yesterday shows obstruction of the right kidney.  To OR for cystoscopy, right retrograde, right stent placement.  I reviewed the procedure along with the risks, benefits, alternatives, and likelihood of achieving goals. I stressed the importance of follow up with the stent for exchange.  Questions answered to the patient's stated satisfaction.   LOS: 6  days   Milford Cage 04/15/2012, 6:18 AM

## 2012-04-15 NOTE — Progress Notes (Signed)
Patient ID: Tim Herrera, male   DOB: 08-14-1935, 76 y.o.   MRN: [id number] S:pt feels "great" this morning.  S/p double-J stent placement O:BP 135/80  Pulse 81  Temp 98 F (36.7 C) (Oral)  Resp 14  Ht 5\' 10"  (1.778 m)  Wt 140.2 kg (309 lb 1.4 oz)  BMI 44.35 kg/m2  SpO2 99%  Intake/Output Summary (Last 24 hours) at 04/15/12 0923 Last data filed at 04/15/12 0848  Gross per 24 hour  Intake   1104 ml  Output   2960 ml  Net  -1856 ml   Intake/Output: I/O last 3 completed shifts: In: 864 [P.O.:840; IV Piggyback:24] Out: 5386 [Urine:5385; Stool:1]  Intake/Output this shift:  Total I/O In: 600 [I.V.:600] Out: -  Weight change: -3.318 kg (-7 lb 5 oz) Gen:WD WN obese AAM in NAD CVS:RRR Resp:CTA WUJ:[id number] Ext:1+ edema   Lab 04/15/12 0403 04/14/12 0419 04/13/12 0500 04/12/12 0500 04/11/12 1125 04/11/12 0320 04/10/12 0514 04/09/12 1146  NA 136 137 136 131* 132* 134* 142 --  K 3.9 4.3 4.4 4.2 5.0 5.2* 5.0 --  CL 100 100 100 99 103 105 112 --  CO2 24 25 22 21 20 20 22  --  GLUCOSE 81 100* 123* 396* 365* 67* 44* --  BUN 74* 66* 57* 51* 48* 50* 45* --  CREATININE 2.91* 3.03* 2.77* 2.75* 2.77* 2.86* 2.53* --  ALBUMIN 2.8* 3.0* 2.9* 3.0* -- -- -- 3.4*  CALCIUM 8.6 9.1 9.0 8.7 8.1* 8.0* 8.0* --  PHOS 5.1* 5.0* 4.4 4.1 -- 3.3 -- --  AST -- -- -- -- -- -- -- 21  ALT -- -- -- -- -- -- -- 23   Liver Function Tests:  Lab 04/15/12 0403 04/14/12 0419 04/13/12 0500 04/09/12 1146  AST -- -- -- 21  ALT -- -- -- 23  ALKPHOS -- -- -- 272*  BILITOT -- -- -- 0.4  PROT -- -- -- 7.2  ALBUMIN 2.8* 3.0* 2.9* --   No results found for this basename: LIPASE:3,AMYLASE:3 in the last 168 hours No results found for this basename: AMMONIA:3 in the last 168 hours CBC:  Lab 04/12/12 0500 04/11/12 0320 04/10/12 0514 04/09/12 1146  WBC 4.7 4.4 5.0 --  NEUTROABS -- -- -- --  HGB 8.9* 8.6* 8.8* --  HCT 27.2* 26.8* 27.4* --  MCV 86.3 86.2 86.4 86.4  PLT 254 224 219 --   Cardiac Enzymes: No results  found for this basename: CKTOTAL:5,CKMB:5,CKMBINDEX:5,TROPONINI:5 in the last 168 hours CBG:  Lab 04/15/12 0818 04/14/12 2124 04/14/12 1658 04/14/12 0746 04/13/12 2119  GLUCAP 92 108* 107* 117* 137*    Iron Studies: No results found for this basename: IRON,TIBC,TRANSFERRIN,FERRITIN in the last 72 hours Studies/Results: Nm Renal Imaging Flow W/pharm  04/14/2012  *RADIOLOGY REPORT*  Clinical Data:  Evaluate right hydronephrosis.  NUCLEAR MEDICINE RENAL SCAN WITH DIURETIC ADMINISTRATION  Technique:  Radionuclide angiographic and sequential renal images were obtained after intravenous injection of radiopharmaceutical. Imaging was continued during slow intravenous injection of Lasix approximately 15 minutes after the start of the examination. 60 mg Lasix was given.  Radiopharmaceutical:  13.9 mCi Tc-37m MAG3  Comparison:  Abdominal CT 03/1931 1013  Findings: There is slightly decreased flow to the right kidney compared to the left.  The differential renal function is 56.5% on the left and 43.5% on the right. There is spontaneous excretion from the left kidney prior to the administration of Lasix.  The T 1/2 left pre Lasix time is 23.5 minutes and post  Lasix is 54 minutes.  The T 1/2 right pre Lasix time is 0 and post Lasix is 479.5 minutes.  There is no significant excretion identified from the right upper renal collecting system on this examination.  IMPRESSION: The differential renal function is 56.5% on the left and 43.5% on the right.  High-grade right hydronephrosis and obstruction.   Original Report Authenticated By: Richarda Overlie, M.D.       . aspirin EC  81 mg Oral Daily  . atorvastatin  20 mg Oral Daily  . calcitRIOL  0.25 mcg Oral Daily  . carvedilol  3.125 mg Oral BID WC  . darbepoetin (ARANESP) injection - NON-DIALYSIS  100 mcg Subcutaneous Q Fri-1800  . ferrous sulfate  325 mg Oral Q breakfast  . furosemide  60 mg Intravenous Once  . furosemide  80 mg Intravenous Q8H  . heparin subcutaneous   5,000 Units Subcutaneous Q8H  . isosorbide mononitrate  30 mg Oral Daily  . sodium chloride  10-40 mL Intracatheter Q12H  . sodium chloride  3 mL Intravenous Q12H  . DISCONTD:  ceFAZolin (ANCEF) IV  3 g Intravenous 60 min Pre-Op  . DISCONTD: furosemide  120 mg Intravenous Q8H    BMET    Component Value Date/Time   NA 136 04/15/2012 0403   K 3.9 04/15/2012 0403   CL 100 04/15/2012 0403   CO2 24 04/15/2012 0403   GLUCOSE 81 04/15/2012 0403   BUN 74* 04/15/2012 0403   CREATININE 2.91* 04/15/2012 0403   CALCIUM 8.6 04/15/2012 0403   GFRNONAA 20* 04/15/2012 0403   GFRAA 23* 04/15/2012 0403   CBC    Component Value Date/Time   WBC 4.7 04/12/2012 0500   RBC 3.15* 04/12/2012 0500   HGB 8.9* 04/12/2012 0500   HCT 27.2* 04/12/2012 0500   PLT 254 04/12/2012 0500   MCV 86.3 04/12/2012 0500   MCH 28.3 04/12/2012 0500   MCHC 32.7 04/12/2012 0500   RDW 15.3 04/12/2012 0500   LYMPHSABS 0.8 10/27/2010 0938   MONOABS 0.6 10/27/2010 0938   EOSABS 0.6 10/27/2010 0938   BASOSABS 0.0 10/27/2010 0938    Assessment/Plan:  1. AKI/CKD- marked hydronephrosis of right kidney with renal cortex thinning. May be chronic but likely contributing to the AKI/CKD. Agree with Urology evaluation. Overall likely combination of decompensated CHF with ongoing diuresis and hydronephrosis.  1. SPEP and UPEP negative  2. Creatinine stabilized with decreased lasix dose. 2. Right Hydronephrosis- s/p double-J stent.  Will follow UOP and daily Scr.  Appreciate urology's care. 3. Anasarca- likely combo of decompensated systolic and diastolic CHF, but also with h/o fatty liver disease. Cont with diuresis. -3L over last 24 hours. Decrease lasix as above and is down ~15kg since admission.  Hopefully can change to po lasix tomorrow. 4. Hyperkalemia- agree with discontinuation of K supplements and spironalactone (pt also with evidence of gynecomastia) 5. Morbid obesity- recommend dietician consult for weight loss, as well as CHF,  diabetes and CKD education. 6. Anemia- presumably due to chronic disease but will check iron stores, SPEP, UPEP as above. S/p IV iron and will start epo 7. DM with hypoglycemia- per primary svc 8. Right-sided CHF- high pretest probability of OSA due to obesity hypoventillation syndrome, so CPAP may be helpful with diuresis. Need to limit intake.  Total UF 15kg thus far 9. Iron deficiency- s/p Feraheme and follow. 10. HTN- stable 11. Dispo- still requiring IV diuresis so not stable for D/C to home at this time 12.  Elan Mcelvain  A 

## 2012-04-16 LAB — RENAL FUNCTION PANEL
CO2: 25 mEq/L (ref 19–32)
Chloride: 98 mEq/L (ref 96–112)
Creatinine, Ser: 2.8 mg/dL — ABNORMAL HIGH (ref 0.50–1.35)
GFR calc Af Amer: 24 mL/min — ABNORMAL LOW (ref >90)
GFR calc non Af Amer: 21 mL/min — ABNORMAL LOW (ref >90)
Glucose, Bld: 130 mg/dL — ABNORMAL HIGH (ref 70–99)

## 2012-04-16 LAB — GLUCOSE, CAPILLARY
Glucose-Capillary: 119 mg/dL — ABNORMAL HIGH (ref 70–99)
Glucose-Capillary: 166 mg/dL — ABNORMAL HIGH (ref 70–99)

## 2012-04-16 MED ORDER — TAMSULOSIN HCL 0.4 MG PO CAPS
0.4000 mg | ORAL_CAPSULE | Freq: Every day | ORAL | Status: DC
  Administered 2012-04-16 – 2012-04-17 (×2): 0.4 mg via ORAL
  Filled 2012-04-16 (×3): qty 1

## 2012-04-16 MED ORDER — FUROSEMIDE 80 MG PO TABS
80.0000 mg | ORAL_TABLET | Freq: Three times a day (TID) | ORAL | Status: DC
  Administered 2012-04-16 – 2012-04-17 (×4): 80 mg via ORAL
  Filled 2012-04-16 (×6): qty 1

## 2012-04-16 NOTE — Progress Notes (Signed)
1 Day Post-Op Subjective: No complaints this morning. No acute events overnight. Patient had urinary retention overnight and required foley catheter placement by nursing staff.  He does have a large prostate.  I explained the surgical findings yesterday. I explained that I could not tell the cause of his obstruction and that he would need to follow up with Dr. Mena Goes to ensure this was not a tumor.  I explained the nature of urinary retention with BPH and following a general anesthetic. I advised starting flomax to help him void when it comes time for a voiding trial.  Objective: Vital signs in last 24 hours: Temp:  [98 F (36.7 C)-99 F (37.2 C)] 98.2 F (36.8 C) (11/03 0553) Pulse Rate:  [77-96] 96  (11/03 0553) Resp:  [14-18] 18  (11/03 0553) BP: (115-182)/(56-110) 151/85 mmHg (11/03 0553) SpO2:  [95 %-100 %] 99 % (11/03 0553) Weight:  [137.6 kg (303 lb 5.7 oz)] 137.6 kg (303 lb 5.7 oz) (11/03 0553)  Intake/Output from previous day: 11/02 0701 - 11/03 0700 In: 1684 [P.O.:830; I.V.:780; IV Piggyback:24] Out: 4220 [Urine:4220] Intake/Output this shift:    Physical Exam:  Filed Vitals:   04/16/12 0553  BP: 151/85  Pulse: 96  Temp: 98.2 F (36.8 C)  Resp: 18    Gen: NAD, A&Ox 3  CV: RRR Chest: CTA-B GU: Foley catheter draining pink urine.  Lab Results: No results found for this basename: HGB:3,HCT:3 in the last 72 hours BMET  Basename 04/16/12 0500 04/15/12 0403  NA 137 136  K 3.9 3.9  CL 98 100  CO2 25 24  GLUCOSE 130* 81  BUN 76* 74*  CREATININE 2.80* 2.91*  CALCIUM 8.7 8.6   No results found for this basename: LABPT:3,INR:3 in the last 72 hours No results found for this basename: LABURIN:1 in the last 72 hours Results for orders placed during the hospital encounter of 04/09/12  MRSA PCR SCREENING     Status: Normal   Collection Time   04/10/12  2:22 PM      Component Value Range Status Comment   MRSA by PCR NEGATIVE  NEGATIVE Final   SURGICAL PCR  SCREEN     Status: Normal   Collection Time   04/14/12  9:53 PM      Component Value Range Status Comment   MRSA, PCR NEGATIVE  NEGATIVE Final    Staphylococcus aureus NEGATIVE  NEGATIVE Final     Studies/Results: Nm Renal Imaging Flow W/pharm  04/14/2012  *RADIOLOGY REPORT*  Clinical Data:  Evaluate right hydronephrosis.  NUCLEAR MEDICINE RENAL SCAN WITH DIURETIC ADMINISTRATION  Technique:  Radionuclide angiographic and sequential renal images were obtained after intravenous injection of radiopharmaceutical. Imaging was continued during slow intravenous injection of Lasix approximately 15 minutes after the start of the examination. 60 mg Lasix was given.  Radiopharmaceutical:  13.9 mCi Tc-63m MAG3  Comparison:  Abdominal CT 03/1931 1013  Findings: There is slightly decreased flow to the right kidney compared to the left.  The differential renal function is 56.5% on the left and 43.5% on the right. There is spontaneous excretion from the left kidney prior to the administration of Lasix.  The T 1/2 left pre Lasix time is 23.5 minutes and post Lasix is 54 minutes.  The T 1/2 right pre Lasix time is 0 and post Lasix is 479.5 minutes.  There is no significant excretion identified from the right upper renal collecting system on this examination.  IMPRESSION: The differential renal function is 56.5% on  the left and 43.5% on the right.  High-grade right hydronephrosis and obstruction.   Original Report Authenticated By: Richarda Overlie, M.D.     Assessment/Plan: AKI/CKD - S/p placement of right ureter stent yesterday.  Foley catheter placed overnight. Start flomax. No voiding trial today. Dr. Mena Goes will determine when to try catheter removal.    LOS: 7 days   Milford Cage 04/16/2012, 7:24 AM

## 2012-04-16 NOTE — Plan of Care (Signed)
Problem: Food- and Nutrition-Related Knowledge Deficit (NB-1.1) Goal: Nutrition education Formal process to instruct or train a patient/client in a skill or to impart knowledge to help patients/clients voluntarily manage or modify food choices and eating behavior to maintain or improve health.  Outcome: Completed/Met Date Met:  04/16/12 Patient with DM, CHF, stage 3 CKD, and morbid obesity. Consult received for diet education. We reviewed basic carbohydrate counting, portion control for weight management, low sodium nutrition, and brief education on nutrition for CKD. Patient was provided with educational handouts.

## 2012-04-16 NOTE — Progress Notes (Signed)
Earlier in the shift, pt complained of bladder spasms.  Ditropan was given with some relief.  Pt then started feeling the urge to urinate but was unable to initiate,dribbling was present.  Bladder scan indicated 657 ml of urine in the bladder.  BP was also elevated at this time.  MD was notified.  Orders for coude catheter and irrigation obtained.  Pt felt immediate relief with catheter placement.  Small clots were noted in collection bag.  BP was significantly lower after catheterization.

## 2012-04-16 NOTE — Progress Notes (Signed)
Patient ID: Thoms Barthelemy, male   DOB: 01-Aug-1935, 76 y.o.   MRN: [id number] S:feels great O:BP 151/85  Pulse 96  Temp 98.2 F (36.8 C) (Oral)  Resp 18  Ht 5\' 10"  (1.778 m)  Wt 137.6 kg (303 lb 5.7 oz)  BMI 43.53 kg/m2  SpO2 99%  Intake/Output Summary (Last 24 hours) at 04/16/12 0817 Last data filed at 04/16/12 3086  Gross per 24 hour  Intake   1184 ml  Output   4220 ml  Net  -3036 ml   Intake/Output: I/O last 3 completed shifts: In: 1708 [P.O.:830; I.V.:780; Other:50; IV Piggyback:48] Out: 6420 [Urine:6420]  Intake/Output this shift:    Weight change: -2.6 kg (-5 lb 11.7 oz) Gen:WD obese AAM in NAD CVS:no rub Resp:CTA VHQ:IONGE Ext:2+ Edema   Lab 04/16/12 0500 04/15/12 0403 04/14/12 0419 04/13/12 0500 04/12/12 0500 04/11/12 1125 04/11/12 0320 04/09/12 1146  NA 137 136 137 136 131* 132* 134* --  K 3.9 3.9 4.3 4.4 4.2 5.0 5.2* --  CL 98 100 100 100 99 103 105 --  CO2 25 24 25 22 21 20 20  --  GLUCOSE 130* 81 100* 123* 396* 365* 67* --  BUN 76* 74* 66* 57* 51* 48* 50* --  CREATININE 2.80* 2.91* 3.03* 2.77* 2.75* 2.77* 2.86* --  ALBUMIN 2.9* 2.8* 3.0* 2.9* 3.0* -- -- 3.4*  CALCIUM 8.7 8.6 9.1 9.0 8.7 8.1* 8.0* --  PHOS 5.4* 5.1* 5.0* 4.4 4.1 -- 3.3 --  AST -- -- -- -- -- -- -- 21  ALT -- -- -- -- -- -- -- 23   Liver Function Tests:  Lab 04/16/12 0500 04/15/12 0403 04/14/12 0419 04/09/12 1146  AST -- -- -- 21  ALT -- -- -- 23  ALKPHOS -- -- -- 272*  BILITOT -- -- -- 0.4  PROT -- -- -- 7.2  ALBUMIN 2.9* 2.8* 3.0* --   No results found for this basename: LIPASE:3,AMYLASE:3 in the last 168 hours No results found for this basename: AMMONIA:3 in the last 168 hours CBC:  Lab 04/12/12 0500 04/11/12 0320 04/10/12 0514 04/09/12 1146  WBC 4.7 4.4 5.0 --  NEUTROABS -- -- -- --  HGB 8.9* 8.6* 8.8* --  HCT 27.2* 26.8* 27.4* --  MCV 86.3 86.2 86.4 86.4  PLT 254 224 219 --   Cardiac Enzymes: No results found for this basename: CKTOTAL:5,CKMB:5,CKMBINDEX:5,TROPONINI:5 in  the last 168 hours CBG:  Lab 04/15/12 2227 04/15/12 1610 04/15/12 1224 04/15/12 0818 04/14/12 2124  GLUCAP 130* 181* 162* 92 108*    Iron Studies: No results found for this basename: IRON,TIBC,TRANSFERRIN,FERRITIN in the last 72 hours Studies/Results: Nm Renal Imaging Flow W/pharm  04/14/2012  *RADIOLOGY REPORT*  Clinical Data:  Evaluate right hydronephrosis.  NUCLEAR MEDICINE RENAL SCAN WITH DIURETIC ADMINISTRATION  Technique:  Radionuclide angiographic and sequential renal images were obtained after intravenous injection of radiopharmaceutical. Imaging was continued during slow intravenous injection of Lasix approximately 15 minutes after the start of the examination. 60 mg Lasix was given.  Radiopharmaceutical:  13.9 mCi Tc-41m MAG3  Comparison:  Abdominal CT 03/1931 1013  Findings: There is slightly decreased flow to the right kidney compared to the left.  The differential renal function is 56.5% on the left and 43.5% on the right. There is spontaneous excretion from the left kidney prior to the administration of Lasix.  The T 1/2 left pre Lasix time is 23.5 minutes and post Lasix is 54 minutes.  The T 1/2 right pre Lasix time is  0 and post Lasix is 479.5 minutes.  There is no significant excretion identified from the right upper renal collecting system on this examination.  IMPRESSION: The differential renal function is 56.5% on the left and 43.5% on the right.  High-grade right hydronephrosis and obstruction.   Original Report Authenticated By: Richarda Overlie, M.D.       . aspirin EC  81 mg Oral Daily  . atorvastatin  20 mg Oral Daily  . calcitRIOL  0.25 mcg Oral Daily  . carvedilol  3.125 mg Oral BID WC  . darbepoetin (ARANESP) injection - NON-DIALYSIS  100 mcg Subcutaneous Q Fri-1800  . ferrous sulfate  325 mg Oral Q breakfast  . furosemide  80 mg Intravenous Q8H  . heparin subcutaneous  5,000 Units Subcutaneous Q8H  . isosorbide mononitrate  30 mg Oral Daily  . sodium chloride  10-40 mL  Intracatheter Q12H  . sodium chloride  3 mL Intravenous Q12H  . Tamsulosin HCl  0.4 mg Oral QPC supper  . [DISCONTINUED]  ceFAZolin (ANCEF) IV  3 g Intravenous 60 min Pre-Op    BMET    Component Value Date/Time   NA 137 04/16/2012 0500   K 3.9 04/16/2012 0500   CL 98 04/16/2012 0500   CO2 25 04/16/2012 0500   GLUCOSE 130* 04/16/2012 0500   BUN 76* 04/16/2012 0500   CREATININE 2.80* 04/16/2012 0500   CALCIUM 8.7 04/16/2012 0500   GFRNONAA 21* 04/16/2012 0500   GFRAA 24* 04/16/2012 0500   CBC    Component Value Date/Time   WBC 4.7 04/12/2012 0500   RBC 3.15* 04/12/2012 0500   HGB 8.9* 04/12/2012 0500   HCT 27.2* 04/12/2012 0500   PLT 254 04/12/2012 0500   MCV 86.3 04/12/2012 0500   MCH 28.3 04/12/2012 0500   MCHC 32.7 04/12/2012 0500   RDW 15.3 04/12/2012 0500   LYMPHSABS 0.8 10/27/2010 0938   MONOABS 0.6 10/27/2010 0938   EOSABS 0.6 10/27/2010 0938   BASOSABS 0.0 10/27/2010 0938     Assessment/Plan:  1. AKI/CKD- underlying CKD likely due to longstanding, poorly controlled DM/HTN with chronic CHF/obesity. AKI/CKD likely combination of decompensated CHF with ongoing diuresis and hydronephrosis. 1. Creatinine stabilized with decreased lasix dose and stent. 2. Change to po lasix and see if he continues to diurese  2. Right Hydronephrosis- s/p double-J stent. Will follow UOP and daily Scr. Appreciate urology's care.  Creatinine improved slightly.  Cont to follow trend 3. Anasarca- likely combo of decompensated systolic and diastolic CHF, but also with h/o fatty liver disease. Cont with diuresis. -3L over last 24 hours. Change lasix to po as above and is down ~40lbs since admission. Will change to po lasix today and follow I/O's and daily Scr. 4. Hyperkalemia- off K supplements and spironalactone (pt also with evidence of gynecomastia) 5. Morbid obesity- recommend dietician consult for weight loss, as well as CHF, diabetes and CKD education. 6. Anemia- presumably due to chronic disease but  will check iron stores, SPEP, UPEP as above. S/p IV iron and will start epo 7. DM with hypoglycemia- per primary svc 8. Right-sided CHF- high pretest probability of OSA due to obesity hypoventillation syndrome, so CPAP may be helpful with diuresis. Need to limit intake. Total UF 19kg thus far 9. Iron deficiency- s/p Feraheme and follow. 10. HTN- stable 11. Dispo- still requiring IV diuresis so not stable for D/C to home at this time 12.  Hieu Herms A

## 2012-04-16 NOTE — Progress Notes (Signed)
[facility] PROGRESS NOTE  Monterrio Gerst ZOX:[id number] DOB: 07/23/1935 DOA: 04/09/2012 PCP: [name], MD  Assessment/Plan: 1. Acute systolic/diastolic CHF/NICM--continues to improve. Asymptomatic. Diuretics per nephrology. Continue Coreg, statin, ASA, Imdur. Spironolactone stopped secondary to borderline hyperkalemia. Not an ACE-I given AKI. Follow-up with heart failure clinic on discharge. 2. AKI/CKD stage III--slightly better again today. Likely combination of decompensated CHF with ongoing diuresis and hydronephrosis. SPEP and UPEP negative per nephrology. Management deferred to nephrology. 3. Marked right hydronephrosis--may be contributing to progressive kidney disease. S/p stent 11/2. 4. Anasarca--improving, secondary to CHF. Daily weights (weight down approximately 18 kg since admission), I/O negative >13 liters since admission. 5. Hyperkalemia--resolved. Spironolactone discontinued. 6. Anemia--normocytic. Stable. Received IV iron, Aranesp. Follow-up as outpatient. 7. DM type 2--stable. Follow CBG. No meds. HBa1c is 5.6.  8. Hypoglycemia--resolved. Secondary to overmedication.  9. HTN--labile, follow, continue current medications.  Code Status: full code Family Communication: none present Disposition Plan: home when improved  [name], MD  [facility] Team 2 Pager [phone]. If 8PM-8AM, please contact night-coverage at [email or web address], password [facility] 04/16/2012, 11:08 AM  LOS: 7 days   Brief narrative: 76 y/o man with h/o combined CHF, DM, HTN presented with low blood sugar and shortness of breath. In the beginning of September became diaphoretic and shaky and was found to be hyperglycemic. His PCP had asked him to cut his Amaryl in half. When he refilled his latest prescription, about 2 weeks ago, he forgot to cut them in half and has been taking the entire pill. The day before admission, EMS was called to his house as he again became diaphoretic and confused.  Was found to have a sugar in the 40s. Was given D50 but was not transported to the ED. This recurred again on the day of admission.   Consultants:  Nephrology  OT--no follow-up needed.  PT--HH PT. Rolling walker with 5" wheels   Procedures:  PICC 10/28 >>  11/2 Cystoscopy, Right retrograde pyelogram, Right ureter stent placement  HPI/Subjective: No complaints.  Objective: Filed Vitals:   04/15/12 2228 04/15/12 2234 04/16/12 0100 04/16/12 0553  BP: 182/73 182/110 158/75 151/85  Pulse: 91   96  Temp: 98.5 F (36.9 C)   98.2 F (36.8 C)  TempSrc: Oral   Oral  Resp: 16   18  Height:      Weight:    137.6 kg (303 lb 5.7 oz)  SpO2: 96%   99%    Intake/Output Summary (Last 24 hours) at 04/16/12 1108 Last data filed at 04/16/12 1100  Gross per 24 hour  Intake   1204 ml  Output   4370 ml  Net  -3166 ml   Filed Weights   04/14/12 0617 04/15/12 0428 04/16/12 0553  Weight: 143.518 kg (316 lb 6.4 oz) 140.2 kg (309 lb 1.4 oz) 137.6 kg (303 lb 5.7 oz)    Exam:  General:  Appears calm and comfortable Cardiovascular: RRR, no m/r/g. 3+ BLE edema. Telemetry: SR, no arrhythmias  Respiratory: CTA bilaterally, no w/r/r. Normal respiratory effort. Psychiatric: grossly normal mood and affect, speech fluent and appropriate  Exam current 11/3  Data Reviewed: Basic Metabolic Panel:  Lab 04/16/12 8295 04/15/12 0403 04/14/12 0419 04/13/12 0500 04/12/12 0500  NA 137 136 137 136 131*  K 3.9 3.9 4.3 4.4 4.2  CL 98 100 100 100 99  CO2 25 24 25 22 21   GLUCOSE 130* 81 100* 123* 396*  BUN 76* 74* 66* 57* 51*  CREATININE 2.80* 2.91* 3.03* 2.77*  2.75*  CALCIUM 8.7 8.6 9.1 9.0 8.7  MG -- -- -- -- --  PHOS 5.4* 5.1* 5.0* 4.4 4.1   Liver Function Tests:  Lab 04/16/12 0500 04/15/12 0403 04/14/12 0419 04/13/12 0500 04/12/12 0500 04/09/12 1146  AST -- -- -- -- -- 21  ALT -- -- -- -- -- 23  ALKPHOS -- -- -- -- -- 272*  BILITOT -- -- -- -- -- 0.4  PROT -- -- -- -- -- 7.2  ALBUMIN  2.9* 2.8* 3.0* 2.9* 3.0* --   CBC:  Lab 04/12/12 0500 04/11/12 0320 04/10/12 0514 04/09/12 1146  WBC 4.7 4.4 5.0 4.5  NEUTROABS -- -- -- --  HGB 8.9* 8.6* 8.8* 9.5*  HCT 27.2* 26.8* 27.4* 29.3*  MCV 86.3 86.2 86.4 86.4  PLT 254 224 219 260     Basename 04/09/12 1146  PROBNP 14183.0*   CBG:  Lab 04/15/12 2227 04/15/12 1610 04/15/12 1224 04/15/12 0818 04/14/12 2124  GLUCAP 130* 181* 162* 92 108*    Recent Results (from the past 240 hour(s))  MRSA PCR SCREENING     Status: Normal   Collection Time   04/10/12  2:22 PM      Component Value Range Status Comment   MRSA by PCR NEGATIVE  NEGATIVE Final   SURGICAL PCR SCREEN     Status: Normal   Collection Time   04/14/12  9:53 PM      Component Value Range Status Comment   MRSA, PCR NEGATIVE  NEGATIVE Final    Staphylococcus aureus NEGATIVE  NEGATIVE Final      Studies: Nm Renal Imaging Flow W/pharm  04/14/2012  *RADIOLOGY REPORT*  Clinical Data:  Evaluate right hydronephrosis.  NUCLEAR MEDICINE RENAL SCAN WITH DIURETIC ADMINISTRATION  Technique:  Radionuclide angiographic and sequential renal images were obtained after intravenous injection of radiopharmaceutical. Imaging was continued during slow intravenous injection of Lasix approximately 15 minutes after the start of the examination. 60 mg Lasix was given.  Radiopharmaceutical:  13.9 mCi Tc-24m MAG3  Comparison:  Abdominal CT 03/1931 1013  Findings: There is slightly decreased flow to the right kidney compared to the left.  The differential renal function is 56.5% on the left and 43.5% on the right. There is spontaneous excretion from the left kidney prior to the administration of Lasix.  The T 1/2 left pre Lasix time is 23.5 minutes and post Lasix is 54 minutes.  The T 1/2 right pre Lasix time is 0 and post Lasix is 479.5 minutes.  There is no significant excretion identified from the right upper renal collecting system on this examination.  IMPRESSION: The differential renal  function is 56.5% on the left and 43.5% on the right.  High-grade right hydronephrosis and obstruction.   Original Report Authenticated By: Richarda Overlie, M.D.     Scheduled Meds:    . aspirin EC  81 mg Oral Daily  . atorvastatin  20 mg Oral Daily  . calcitRIOL  0.25 mcg Oral Daily  . carvedilol  3.125 mg Oral BID WC  . darbepoetin (ARANESP) injection - NON-DIALYSIS  100 mcg Subcutaneous Q Fri-1800  . ferrous sulfate  325 mg Oral Q breakfast  . furosemide  80 mg Oral Q8H  . heparin subcutaneous  5,000 Units Subcutaneous Q8H  . isosorbide mononitrate  30 mg Oral Daily  . sodium chloride  10-40 mL Intracatheter Q12H  . sodium chloride  3 mL Intravenous Q12H  . Tamsulosin HCl  0.4 mg Oral QPC supper  . [DISCONTINUED]  furosemide  80 mg Intravenous Q8H   Continuous Infusions:   Principal Problem:  *Hypoglycemia Active Problems:  Chronic systolic congestive heart failure, NYHA class 2  HTN (hypertension)  CKD (chronic kidney disease) stage 3, GFR 30-59 ml/min  Acute on chronic combined systolic and diastolic CHF (congestive heart failure)  DM (diabetes mellitus)  Anemia  Acute on chronic renal failure  Fatty liver  Obesity, Class III, BMI 40-49.9 (morbid obesity)  Hydronephrosis of right kidney     [name], MD  [facility] Team 2 Pager [phone]. If 8PM-8AM, please contact night-coverage at [email or web address], password [facility] 04/16/2012, 11:08 AM  LOS: 7 days   Time spent: 15 minutes

## 2012-04-17 ENCOUNTER — Encounter (HOSPITAL_COMMUNITY): Payer: Self-pay | Admitting: Urology

## 2012-04-17 LAB — RENAL FUNCTION PANEL
BUN: 75 mg/dL — ABNORMAL HIGH (ref 6–23)
CO2: 24 mEq/L (ref 19–32)
Chloride: 99 mEq/L (ref 96–112)
Glucose, Bld: 129 mg/dL — ABNORMAL HIGH (ref 70–99)
Phosphorus: 4.8 mg/dL — ABNORMAL HIGH (ref 2.3–4.6)
Potassium: 3.7 mEq/L (ref 3.5–5.1)

## 2012-04-17 LAB — GLUCOSE, CAPILLARY
Glucose-Capillary: 136 mg/dL — ABNORMAL HIGH (ref 70–99)
Glucose-Capillary: 140 mg/dL — ABNORMAL HIGH (ref 70–99)
Glucose-Capillary: 126 mg/dL — ABNORMAL HIGH (ref 70–99)

## 2012-04-17 MED ORDER — FUROSEMIDE 40 MG PO TABS
40.0000 mg | ORAL_TABLET | Freq: Every day | ORAL | Status: DC
  Administered 2012-04-17: 40 mg via ORAL
  Filled 2012-04-17 (×2): qty 1

## 2012-04-17 MED ORDER — FUROSEMIDE 80 MG PO TABS
80.0000 mg | ORAL_TABLET | Freq: Every day | ORAL | Status: DC
  Administered 2012-04-18: 80 mg via ORAL
  Filled 2012-04-17 (×2): qty 1

## 2012-04-17 MED ORDER — CARVEDILOL 6.25 MG PO TABS
6.2500 mg | ORAL_TABLET | Freq: Two times a day (BID) | ORAL | Status: DC
  Administered 2012-04-18: 6.25 mg via ORAL
  Filled 2012-04-17 (×4): qty 1

## 2012-04-17 NOTE — Progress Notes (Signed)
Patient ID: Tim Herrera, male   DOB: 26-Oct-1935, 76 y.o.   MRN: [id number]  The patient underwent cystoscopy right retrograde pyelogram and right ureteral stent placement April 15, 2012. Dr. Margarita Grizzle did the case as he was on call. Dr. Margarita Grizzle could not determine if the patient had UPJ obstruction from a stricture or tumor. The patient went into urinary retention that night had a catheter placed. He was started on tamsulosin. He has no complaints today.  PE: Urine light red - pink, no clots. 20Fr foley in place.   Imp - Acute on CRF Hydronephrosis - s/p right stent BPHin her Urinary retention - on tamsulosin  Plan - If he goes home today, he will keep his Foley and I will have him see the extender in 3-5 days for voiding trial/Foley removal.   If he remain in hospital, I've written an order to have the foley removed at [facility] tomorrow morning.   I'll see the patient in the office in 2-3 weeks.

## 2012-04-17 NOTE — Progress Notes (Signed)
Physical Therapy Treatment Patient Details Name: Tim Herrera MRN: [id number] DOB: 1935-11-14 Today's Date: 04/17/2012 Time: 1350-1403 PT Time Calculation (min): 13 min  PT Assessment / Plan / Recommendation Comments on Treatment Session  pt with very little dyspnea on exertion today; states he feels much better.     Follow Up Recommendations  Home health PT     Does the patient have the potential to tolerate intense rehabilitation     Barriers to Discharge        Equipment Recommendations  Other (comment) (pt states he has RW)    Recommendations for Other Services    Frequency Min 3X/week   Plan Discharge plan remains appropriate    Precautions / Restrictions Precautions Precautions: None   Pertinent Vitals/Pain     Mobility  Bed Mobility Bed Mobility: Supine to Sit Supine to Sit: 6: Modified independent (Device/Increase time);HOB flat;With rails Transfers Transfers: Sit to Stand;Stand to Sit Sit to Stand: 6: Modified independent (Device/Increase time);From bed Stand to Sit: 6: Modified independent (Device/Increase time);To bed Ambulation/Gait Ambulation/Gait Assistance: 5: Supervision;6: Modified independent (Device/Increase time) Ambulation Distance (Feet): 230 Feet Assistive device: Rolling walker Ambulation/Gait Assistance Details: cues for breathing Gait Pattern: Step-through pattern;Wide base of support    Exercises     PT Diagnosis:    PT Problem List:   PT Treatment Interventions:     PT Goals Acute Rehab PT Goals Time For Goal Achievement: 04/26/12 Potential to Achieve Goals: Good Pt will go Supine/Side to Sit: Independently PT Goal: Supine/Side to Sit - Progress: Progressing toward goal Pt will go Sit to Stand: with modified independence PT Goal: Sit to Stand - Progress: Met Pt will go Stand to Sit: with modified independence PT Goal: Stand to Sit - Progress: Met Pt will Ambulate: >150 feet;with modified independence;with least restrictive  assistive device PT Goal: Ambulate - Progress: Progressing toward goal  Visit Information  Last PT Received On: 04/17/12 Assistance Needed: +1    Subjective Data  Subjective: I want to get out of here Patient Stated Goal:  to go home   Cognition  Overall Cognitive Status: Appears within functional limits for tasks assessed/performed Arousal/Alertness: Awake/alert Orientation Level: Appears intact for tasks assessed Behavior During Session: [facility] for tasks performed    Balance  Balance Balance Assessed: Yes Dynamic Standing Balance Dynamic Standing - Balance Support: Left upper extremity supported;During functional activity (simulated reach for clothing in drawer, closet) Dynamic Standing - Level of Assistance: 5: Stand by assistance  End of Session PT - End of Session Activity Tolerance: Patient tolerated treatment well Patient left: Other (comment);with nursing in room;with call bell/phone within reach (EOB)   GP     [facility] 04/17/2012, 2:05 PM

## 2012-04-17 NOTE — Progress Notes (Addendum)
Occupational Therapy Treatment Patient Details Name: Tim Herrera MRN: [id number] DOB: [birth year] Today's Date: 04/17/2012 Time: 4098-1191 OT Time Calculation (min): 26 min  OT Assessment / Plan / Recommendation Comments on Treatment Session Pt did well this session. Worked on functional mobility in room for clothing retrievel and toilet transfer. Also reviewed energy conservation techniques.     Follow Up Recommendations  No OT follow up    Barriers to Discharge       Equipment Recommendations  Rolling walker with 5" wheels    Recommendations for Other Services    Frequency Min 2X/week   Plan Discharge plan remains appropriate    Precautions / Restrictions Precautions Precautions: None        ADL  Toilet Transfer: Performed;Min guard Toilet Transfer Method: Sit to stand Toilet Transfer Equipment: Comfort height toilet;Grab bars Equipment Used: Rolling walker;Other (comment) (wide RW) ADL Comments: Pt in bathroom doing bath when OT arrived. Discussed toilet height and bathroom set up while pt finished up bath. Pt states his toilet is higher than the one here and he has a vanity beside the toilet on the right to help him stand and sit. He declines need for a 3in1. Pt used wide RW for functional mobility in room to simulate clothing retrieval from closet and drawers and was min guard assist level. Discussed/reviewed energy conservation techniques and pt was able to state 3 techniques from handout.     OT Diagnosis:    OT Problem List:   OT Treatment Interventions:     OT Goals ADL Goals ADL Goal: Toilet Transfer - Progress: Progressing toward goals Miscellaneous OT Goals OT Goal: Miscellaneous Goal #1 - Progress: Met OT Goal: Miscellaneous Goal #2 - Progress: Met  Visit Information  Last OT Received On: 04/17/12 Assistance Needed: +1    Subjective Data  Subjective: I want to go home Patient Stated Goal: home   Prior Functioning       Cognition  Overall Cognitive  Status: Appears within functional limits for tasks assessed/performed Arousal/Alertness: Awake/alert Orientation Level: Appears intact for tasks assessed Behavior During Session: [facility] for tasks performed    Mobility  Shoulder Instructions Transfers Transfers: Sit to Stand;Stand to Sit Sit to Stand: 5: Supervision;With upper extremity assist;From chair/3-in-1 Stand to Sit: With upper extremity assist;To chair/3-in-1;4: Min guard       Exercises      Balance Balance Balance Assessed: Yes Dynamic Standing Balance Dynamic Standing - Balance Support: Left upper extremity supported;During functional activity (simulated reach for clothing in drawer, closet) Dynamic Standing - Level of Assistance: 5: Stand by assistance   End of Session OT - End of Session Activity Tolerance: Patient tolerated treatment well Patient left: in chair;with call bell/phone within reach  GO     Lennox Laity [phone] 04/17/2012, 11:50 AM

## 2012-04-17 NOTE — Progress Notes (Signed)
Subjective: Feeling much better, looking forward to going home soon.   Objective Vital signs in last 24 hours: Filed Vitals:   04/16/12 1342 04/16/12 2150 04/17/12 0529 04/17/12 1300  BP: 148/68 146/67 172/96 169/79  Pulse: 92 97 95 95  Temp: 98.8 F (37.1 C) 99.8 F (37.7 C) 98.3 F (36.8 C) 98.6 F (37 C)  TempSrc: Oral Oral Oral Oral  Resp: 18 20 20 18   Height:      Weight:   135.1 kg (297 lb 13.5 oz)   SpO2: 98% 97% 100% 100%   Weight change: -2.5 kg (-5 lb 8.2 oz)  Intake/Output Summary (Last 24 hours) at 04/17/12 1423 Last data filed at 04/17/12 1300  Gross per 24 hour  Intake    490 ml  Output   2990 ml  Net  -2500 ml   Labs: Basic Metabolic Panel:  Lab 04/17/12 4010 04/16/12 0500 04/15/12 0403 04/14/12 0419 04/13/12 0500 04/12/12 0500 04/11/12 1125 04/11/12 0320  NA 137 137 136 137 136 131* 132* --  K 3.7 3.9 3.9 4.3 4.4 4.2 5.0 --  CL 99 98 100 100 100 99 103 --  CO2 24 25 24 25 22 21 20  --  GLUCOSE 129* 130* 81 100* 123* 396* 365* --  BUN 75* 76* 74* 66* 57* 51* 48* --  CREATININE 2.71* 2.80* 2.91* 3.03* 2.77* 2.75* 2.77* --  ALB -- -- -- -- -- -- -- --  CALCIUM 8.6 8.7 8.6 9.1 9.0 8.7 8.1* --  PHOS 4.8* 5.4* 5.1* 5.0* 4.4 4.1 -- 3.3   Liver Function Tests:  Lab 04/17/12 0500 04/16/12 0500 04/15/12 0403  AST -- -- --  ALT -- -- --  ALKPHOS -- -- --  BILITOT -- -- --  PROT -- -- --  ALBUMIN 3.1* 2.9* 2.8*   No results found for this basename: LIPASE:3,AMYLASE:3 in the last 168 hours No results found for this basename: AMMONIA:3 in the last 168 hours CBC:  Lab 04/12/12 0500 04/11/12 0320  WBC 4.7 4.4  NEUTROABS -- --  HGB 8.9* 8.6*  HCT 27.2* 26.8*  MCV 86.3 86.2  PLT 254 224   PT/INR: @labrcntip (inr:5) Cardiac Enzymes: No results found for this basename: CKTOTAL:5,CKMB:5,CKMBINDEX:5,TROPONINI:5 in the last 168 hours CBG:  Lab 04/17/12 1157 04/17/12 0748 04/16/12 2151 04/16/12 1705 04/16/12 1150  GLUCAP 137* 136* 166* 119* 123*     Iron Studies:  Lab 04/12/12 0500 04/11/12 1400  IRON 16* 24*  TIBC 242 228  TRANSFERRIN -- --  FERRITIN 172 195    Physical Exam:  Blood pressure 169/79, pulse 95, temperature 98.6 F (37 C), temperature source Oral, resp. rate 18, height 5\' 10"  (1.778 m), weight 135.1 kg (297 lb 13.5 oz), SpO2 100.00%.  Gen:WD obese AAM in NAD  CVS:no rub  Resp:CTA  UVO:ZDGUY  Ext:2+ Edema     Assessment/Plan:                                                                                             1. Acute on CRF- baseline creat 1.7,mid 2's and stable now, may or may not improve after  stenting of R side 2. Right Hydronephrosis- s/p stenting on 11/2. No real change in creat yet. + cortical thinning, not sure how much renal function will improve but could take weeks to know.  3. Hyperkalemia- resolved 4. Anasarca/R HF/volume overload- diuresed, weight down 45 lbs. Will decrease lasix to 80 qam and 40 qpm. Adjust further as needed (was on 40 bid prior to admit) 5. HTN- stable 6. Dispo- OK for discharge from renal standpoint. Have scheduled f/u office visit with Dr. Abel Presto on 12/2 at 3 pm at [facility] (see d/c orders also).   Will sign off. Please call as needed  Vinson Moselle  MD [facility] [phone] pgr    [phone] cell 04/17/2012, 2:23 PM

## 2012-04-17 NOTE — Progress Notes (Signed)
[facility] PROGRESS NOTE  Tim Herrera ZOX:[id number] DOB: December 30, 1935 DOA: 04/09/2012 PCP: [name], MD  Assessment/Plan: 1. Acute systolic/diastolic CHF/NICM--continues to improve. Asymptomatic. Lasix 80 QAM, 40 QPM. Continue Coreg, statin, ASA, Imdur. Spironolactone stopped secondary to borderline hyperkalemia. Not an ACE-I given AKI. Follow-up with heart failure clinic on discharge. 2. AKI/CKD stage III--stable. Likely combination of decompensated CHF with ongoing diuresis and hydronephrosis. SPEP and UPEP negative. Lasix as above per nephrology. 3. Marked right hydronephrosis--may be contributing to progressive kidney disease. S/p stent 11/2. Follow-up with Dr. Mena Goes in 2-3 weeks. 4. Anasarca--markedly improved, secondary to CHF. Down approximately 18 kg. 5. Hyperkalemia--resolved. Spironolactone discontinued. 6. Anemia--normocytic. Stable. Received IV iron, Aranesp. Follow-up as outpatient with Dr. Abel Presto. 7. DM type 2--stable. Follow CBG. No meds on discharge. HBa1c is 5.6.  8. Hypoglycemia--resolved. Secondary to overmedication.  9. HTN--elevated. Increase Coreg, continue Imdur, Lasix. Follow-up as outpatient.  Code Status: full code Family Communication: none present Disposition Plan: home 11/5 with HH PT, CHF  [name], MD  [facility] Team 2 Pager [phone]. If 8PM-8AM, please contact night-coverage at [email or web address], password [facility] 04/17/2012, 6:16 PM  LOS: 8 days   Brief narrative: 76 y/o man with h/o combined CHF, DM, HTN presented with low blood sugar and shortness of breath. In the beginning of September became diaphoretic and shaky and was found to be hyperglycemic. His PCP had asked him to cut his Amaryl in half. When he refilled his latest prescription,  he forgot to cut them in half and has been taking the entire pill. The day before admission, EMS was called to his house as he again became diaphoretic and confused. Was found to have a sugar  in the 40s. Was given D50 but was not transported to the ED. This recurred again on the day of admission.   Hypoglycemia resolved with supportive care once Amaryl washed out of system. Has not required any insulin. Hospitalization was prolonged by massive volume overload, acute CHF, acute/chronic renal failure. Patient markedly improved with IV diuresis but creatinine elevated and nephrology was consulted. Renal ultrasound revealed right-sided hydronephrosis and urology was consulted and stent was placed. He will follow-up with nephrology and urology as an outpatient. Seen by PT and HH recommended.  Consultants:  Nephrology  OT--no follow-up needed.  PT--HH PT.   Procedures:  PICC 10/28 >> 11/5  11/2 Cystoscopy, Right retrograde pyelogram, Right ureter stent placement  HPI/Subjective: No complaints. Feels well.  Objective: Filed Vitals:   04/16/12 1342 04/16/12 2150 04/17/12 0529 04/17/12 1300  BP: 148/68 146/67 172/96 169/79  Pulse: 92 97 95 95  Temp: 98.8 F (37.1 C) 99.8 F (37.7 C) 98.3 F (36.8 C) 98.6 F (37 C)  TempSrc: Oral Oral Oral Oral  Resp: 18 20 20 18   Height:      Weight:   135.1 kg (297 lb 13.5 oz)   SpO2: 98% 97% 100% 100%    Intake/Output Summary (Last 24 hours) at 04/17/12 1816 Last data filed at 04/17/12 1557  Gross per 24 hour  Intake    490 ml  Output   3390 ml  Net  -2900 ml   Filed Weights   04/15/12 0428 04/16/12 0553 04/17/12 0529  Weight: 140.2 kg (309 lb 1.4 oz) 137.6 kg (303 lb 5.7 oz) 135.1 kg (297 lb 13.5 oz)    Exam:  General:  Appears calm and comfortable Cardiovascular: RRR, no m/r/g. 3+ BLE edema. Respiratory: CTA bilaterally, no w/r/r. Normal respiratory effort. Psychiatric: grossly normal mood and  affect, speech fluent and appropriate  Exam current 11/4  Data Reviewed: Basic Metabolic Panel:  Lab 04/17/12 1610 04/16/12 0500 04/15/12 0403 04/14/12 0419 04/13/12 0500  NA 137 137 136 137 136  K 3.7 3.9 3.9 4.3 4.4  CL  99 98 100 100 100  CO2 24 25 24 25 22   GLUCOSE 129* 130* 81 100* 123*  BUN 75* 76* 74* 66* 57*  CREATININE 2.71* 2.80* 2.91* 3.03* 2.77*  CALCIUM 8.6 8.7 8.6 9.1 9.0  MG -- -- -- -- --  PHOS 4.8* 5.4* 5.1* 5.0* 4.4   Liver Function Tests:  Lab 04/17/12 0500 04/16/12 0500 04/15/12 0403 04/14/12 0419 04/13/12 0500  AST -- -- -- -- --  ALT -- -- -- -- --  ALKPHOS -- -- -- -- --  BILITOT -- -- -- -- --  PROT -- -- -- -- --  ALBUMIN 3.1* 2.9* 2.8* 3.0* 2.9*   CBC:  Lab 04/12/12 0500 04/11/12 0320  WBC 4.7 4.4  NEUTROABS -- --  HGB 8.9* 8.6*  HCT 27.2* 26.8*  MCV 86.3 86.2  PLT 254 224     Basename 04/09/12 1146  PROBNP 14183.0*   CBG:  Lab 04/17/12 1625 04/17/12 1157 04/17/12 0748 04/16/12 2151 04/16/12 1705  GLUCAP 140* 137* 136* 166* 119*    Recent Results (from the past 240 hour(s))  MRSA PCR SCREENING     Status: Normal   Collection Time   04/10/12  2:22 PM      Component Value Range Status Comment   MRSA by PCR NEGATIVE  NEGATIVE Final   SURGICAL PCR SCREEN     Status: Normal   Collection Time   04/14/12  9:53 PM      Component Value Range Status Comment   MRSA, PCR NEGATIVE  NEGATIVE Final    Staphylococcus aureus NEGATIVE  NEGATIVE Final      Studies: No results found.  Scheduled Meds:    . aspirin EC  81 mg Oral Daily  . atorvastatin  20 mg Oral Daily  . calcitRIOL  0.25 mcg Oral Daily  . carvedilol  3.125 mg Oral BID WC  . darbepoetin (ARANESP) injection - NON-DIALYSIS  100 mcg Subcutaneous Q Fri-1800  . ferrous sulfate  325 mg Oral Q breakfast  . furosemide  40 mg Oral QAC supper  . furosemide  80 mg Oral QPC breakfast  . heparin subcutaneous  5,000 Units Subcutaneous Q8H  . isosorbide mononitrate  30 mg Oral Daily  . sodium chloride  10-40 mL Intracatheter Q12H  . sodium chloride  3 mL Intravenous Q12H  . Tamsulosin HCl  0.4 mg Oral QPC supper  . [DISCONTINUED] furosemide  80 mg Oral Q8H   Continuous Infusions:   Principal Problem:   *Hypoglycemia Active Problems:  Chronic systolic congestive heart failure, NYHA class 2  HTN (hypertension)  CKD (chronic kidney disease) stage 3, GFR 30-59 ml/min  Acute on chronic combined systolic and diastolic CHF (congestive heart failure)  DM (diabetes mellitus)  Anemia  Acute on chronic renal failure  Fatty liver  Obesity, Class III, BMI 40-49.9 (morbid obesity)  Hydronephrosis of right kidney     [name], MD  [facility] Team 2 Pager [phone]. If 8PM-8AM, please contact night-coverage at [email or web address], password [facility] 04/17/2012, 6:16 PM  LOS: 8 days   Time spent: 25 minutes

## 2012-04-18 LAB — RENAL FUNCTION PANEL
CO2: 26 mEq/L (ref 19–32)
Chloride: 99 mEq/L (ref 96–112)
GFR calc Af Amer: 23 mL/min — ABNORMAL LOW (ref >90)
GFR calc non Af Amer: 20 mL/min — ABNORMAL LOW (ref >90)
Glucose, Bld: 112 mg/dL — ABNORMAL HIGH (ref 70–99)
Potassium: 3.6 mEq/L (ref 3.5–5.1)
Sodium: 136 mEq/L (ref 135–145)

## 2012-04-18 LAB — GLUCOSE, CAPILLARY: Glucose-Capillary: 145 mg/dL — ABNORMAL HIGH (ref 70–99)

## 2012-04-18 MED ORDER — SENNOSIDES-DOCUSATE SODIUM 8.6-50 MG PO TABS: 1.0000 | ORAL_TABLET | Freq: Every evening | ORAL | Status: DC | PRN

## 2012-04-18 MED ORDER — FUROSEMIDE 40 MG PO TABS: 40.0000 mg | ORAL_TABLET | Freq: Every day | ORAL | Status: DC

## 2012-04-18 MED ORDER — FUROSEMIDE 80 MG PO TABS: 80.0000 mg | ORAL_TABLET | Freq: Every day | ORAL | Status: DC

## 2012-04-18 MED ORDER — OXYCODONE HCL 5 MG PO TABS: 5.0000 mg | ORAL_TABLET | Freq: Three times a day (TID) | ORAL | Status: DC | PRN

## 2012-04-18 MED ORDER — CALCITRIOL 0.25 MCG PO CAPS: 0.2500 ug | ORAL_CAPSULE | Freq: Every day | ORAL | Status: DC

## 2012-04-18 MED ORDER — TAMSULOSIN HCL 0.4 MG PO CAPS: 0.4000 mg | ORAL_CAPSULE | Freq: Every day | ORAL | Status: DC

## 2012-04-18 MED ORDER — CARVEDILOL 6.25 MG PO TABS: 6.2500 mg | ORAL_TABLET | Freq: Two times a day (BID) | ORAL | Status: DC

## 2012-04-18 NOTE — Progress Notes (Signed)
Talked to the patient about follow up medical care; PCP is Dr Gayleen Orem, patient stated that he will call his PCP and schedule a post hosp follow up apt; CM will fax the discharge summary to the PCP at discharge. Patient lives with spouse and has scales at home and knows to monitor his weight and the importance of following a heart healthy diet; Patient is agreeable to home health care/ [facility] disease management program for CHF; Meds are filled at [facility] and patient stated that he does not have any problem getting his medication.

## 2012-04-18 NOTE — Progress Notes (Signed)
Patient ID: Tim Herrera, male   DOB: 10-May-1936, 76 y.o.   MRN: [id number]  Pt without complaint. Foley removed last night. Voiding with a good flow. Feels like he's emptying his bladder. Not straining to void.   Cr 2.88  Imp/Plan - -Continue tamsulosin -F/u with me in 2-3 weeks.

## 2012-04-18 NOTE — Discharge Summary (Addendum)
Physician Discharge Summary  Tim Herrera WUJ:[id number] DOB: 05-29-1936 DOA: 04/09/2012  PCP: [name], MD  Admit date: 04/09/2012 Discharge date: 04/18/2012  Time spent: 45  minutes  Recommendations for Outpatient Follow-up:  1. Home with  homehealth, PT with CHF monitoring 2. Outpatient PCP follow up in 1 week. Needs BMET checked in 1 week 3. scheduled appt with renal and urology   Discharge Diagnoses:   Principal Problem:  *Acute on chronic combined systolic and diastolic CHF (congestive heart failure)  Active Problems:  Hypoglycemia  Acute on chronic renal failure  Hydronephrosis of right kidney  HTN (hypertension)  CKD (chronic kidney disease) stage 3, GFR 30-59 ml/min  DM (diabetes mellitus)  Anemia  Fatty liver  Obesity, Class III, BMI 40-49.9 (morbid obesity)   Discharge Condition: fair  Diet recommendation: cardiac  Filed Weights   04/16/12 0553 04/17/12 0529 04/18/12 0500  Weight: 137.6 kg (303 lb 5.7 oz) 135.1 kg (297 lb 13.5 oz) 133 kg (293 lb 3.4 oz)    History of present illness:  76 y/o AA male  with h/o combined CHF, DM, HTN presented with low blood sugar and shortness of breath. . Was found to have a sugar in the 40s. Was given D50 but was not transported to the ED. Hypoglycemia resolved with supportive care once Amaryl washed out of system.  Hospitalization was prolonged by massive volume overload, acute CHF, acute/chronic renal failure. Patient markedly improved with IV diuresis but creatinine elevated and nephrology was consulted. Renal ultrasound revealed right-sided hydronephrosis and urology was consulted and stent was placed. He will follow-up with nephrology and urology as an outpatient. Seen by PT and HH recommended.   Hospital Course:  Acute systolic/diastolic CHF -continues to improve with aggressive diurseis. On lasix 40 mg bid at home which is changed to  Lasix 80 QAM, 40 QPM and will be discharged on this dose..  -anasarca improved  ,now negative by 17 L since admission. -Continue Coreg ( dose increased to 6.25 mg bid) , statin, ASA and  Imdur.  -Spironolactone stopped secondary to borderline hyperkalemia. Resumed on discharge. -Not an ACE-I given AKI.  Follow-up with heart failure clinic on discharge.  AKI/CKD stage III - Likely combination of decompensated CHF with ongoing diuresis and hydronephrosis. SPEP and UPEP negative. Lasix as above per nephrology. Has follow up with [facility] in 3 weeks.  Marked right hydronephrosis --may be contributing to progressive kidney disease. S/p stent 11/2. Follow-up with Dr. Mena Goes in 2-3 weeks. Foley removed on 11/4 and voiding without difficulty. Continue flomax.  .Hyperkalemia--resolved. Spironolactone discontinued.  Anemia--normocytic. Stable. Received IV iron, Aranesp. Follow-up as outpatient with Dr. Abel Presto.  DM type 2 with hypoglycemia  HBa1c is 5.6. Hypoglycemia secondary to overmedication ( was supposed to be on half dose of amaryl only) . Have discontinued both Amaryl and metformin.  Marland Kitchen HTN  Increase Coreg, continue Imdur, Lasix. Now stable. Follow-up as outpatient.  Code Status: full code  Family Communication: none present  Disposition Plan: home 11/5 with [facility] PT, CHF     Procedures: PICC 10/28 ( removed on discharge) 11/2 Cystoscopy, Right retrograde pyelogram, Right ureter stent placement  Consultations:  Renal ( [facility]tz)   urology ( Dr eskridge)  Discharge Exam: Filed Vitals:   04/17/12 2200 04/18/12 0500 04/18/12 0520 04/18/12 1026  BP: 128/61  146/76 115/67  Pulse: 85  85 81  Temp: 98.8 F (37.1 C)  98.1 F (36.7 C)   TempSrc: Oral  Oral   Resp: 16  18  Height:      Weight:  133 kg (293 lb 3.4 oz)    SpO2: 98%  98% 97%    General: elderly obese male  in NAD HEENT: no pallor, moist oral mucosa Cardiovascular: Normal S1 &S2, no murmurs Respiratory:  Fine bibasilar crackles, no rhonchi or wheezes Abdomen: soft, NT, ND  BS+ Ext: warm, 1 + edema CNS: AAOX 3, non focal  Discharge Instructions     Medication List     As of 04/18/2012 11:40 AM    STOP taking these medications         glimepiride 2 MG tablet   Commonly known as: AMARYL      metFORMIN 500 MG tablet   Commonly known as: GLUCOPHAGE                    TAKE these medications         aspirin EC 81 MG tablet   Take 81 mg by mouth daily.      atorvastatin 20 MG tablet   Commonly known as: LIPITOR   Take 20 mg by mouth daily.      calcitRIOL 0.25 MCG capsule   Commonly known as: ROCALTROL   Take 1 capsule (0.25 mcg total) by mouth daily.                  carvedilol 6.25 MG tablet   Commonly known as: COREG   Take 1 tablet (6.25 mg total) by mouth 2 (two) times daily with a meal.      ferrous sulfate 325 (65 FE) MG tablet   Take 325 mg by mouth daily with breakfast.      furosemide 40 MG tablet   Commonly known as: LASIX   Take 1 tablet (40 mg total) by mouth daily before supper.      furosemide 80 MG tablet   Commonly known as: LASIX   Take 1 tablet (80 mg total) by mouth daily after breakfast.      hydrALAZINE 25 MG tablet   Commonly known as: APRESOLINE   Take 0.5 tablets (12.5 mg total) by mouth 3 (three) times daily.      isosorbide mononitrate 30 MG 24 hr tablet   Commonly known as: IMDUR   Take 1 tablet (30 mg total) by mouth daily.      oxyCODONE 5 MG immediate release tablet   Commonly known as: Oxy IR/ROXICODONE   Take 1 tablet (5 mg total) by mouth every 8 (eight) hours as needed.      potassium chloride SA 20 MEQ tablet   Commonly known as: K-DUR,KLOR-CON   Take 20 mEq by mouth daily.      senna-docusate 8.6-50 MG per tablet   Commonly known as: Senokot-S   Take 1 tablet by mouth at bedtime as needed.         Spironolactone 25 mg 1 tablet po daily          Tamsulosin HCl 0.4 MG Caps   Commonly known as: FLOMAX   Take 1 capsule (0.4 mg total) by mouth daily after supper.      VENTOLIN  HFA 108 (90 BASE) MCG/ACT inhaler   Generic drug: albuterol   Inhale 2 puffs into the lungs every 4 (four) hours as needed. For shortness of breath.           Follow-up Information    Follow up with Eskridge, Lowella Petties, MD. (3-4 weeks)    Contact information:  [address] [facility] [place] [facility] [place] [address] [phone]       Follow up with [facility]. (Mon Dec 2nd at 3:00 PM (please arrive at 2:30 pm) with Dr. Geanie Berlin)    Contact information:   [address] [place] [address] [phone]      Follow up with [name], MD. In 1 week. (needs BMET checked)    Contact information:   [address] [facility] [place] [address] [phone]           The results of significant diagnostics from this hospitalization (including imaging, microbiology, ancillary and laboratory) are listed below for reference.    Significant Diagnostic Studies: Ct Abdomen Pelvis Wo Contrast  04/12/2012  *RADIOLOGY REPORT*  Clinical Data: Right-sided hydronephrosis.  Question obstructing stone or source of obstruction.  CT ABDOMEN AND PELVIS WITHOUT CONTRAST  Technique:  Multidetector CT imaging of the abdomen and pelvis was performed following the standard protocol without intravenous contrast.  Comparison: 04/11/2012 ultrasound.  No comparison CT.  Findings: Marked right-sided hydronephrosis.  This is seen to the level of the right ureteral pelvic junction suggesting ureteral pelvic junction obstruction whether from the stricture or congenital.  Of note is that there is a crossing vessel in this region and which may be important if endoluminal ureterostomy is performed.  No renal or ureteral obstructing calculus is noted.  Diffuse third spacing of fluid most notable subcutaneous region.  No definitive extraluminal bowel inflammatory process or free air.   Evaluation of solid abdominal viscera is limited by lack of IV contrast.  Taking this limitation into account no focal worrisome hepatic, splenic, pancreatic, adrenal or renal lesion.  Mild hyperplasia of the adrenal glands.  No calcified gallstone.  Atherosclerotic type changes aorta and iliac arteries with dilated right iliac artery measuring up to 1.7 cm.  Slightly lobulated appearance of the prostate gland.  Clinical and laboratory correlation recommended.  No gross urinary bladder abnormality.  L5 pars defects with anterior slip of L5 and degenerative changes contribute to spinal stenosis and foraminal narrowing.  Lung bases without worrisome abnormality.  Scattered small lymph nodes.  IMPRESSION: Marked right-sided hydronephrosis.  This is seen to the level of the right ureteral pelvic junction suggesting ureteral pelvic junction obstruction whether from the stricture or congenital.  Of note is that there is a crossing vessel in this region and which may be important if endoluminal ureterostomy is performed.  No renal or ureteral obstructing stone.  Diffuse third spacing of fluid most notable subcutaneous region.  Atherosclerotic type changes aorta and iliac arteries with dilated right iliac artery measuring up to 1.7 cm.  Slightly lobulated appearance of the prostate gland.  L5 pars defects with anterior slip of L5 and degenerative changes contribute to spinal stenosis and foraminal narrowing.   Original Report Authenticated By: Fuller Canada, M.D.    Dg Chest 2 View  04/09/2012  *RADIOLOGY REPORT*  Clinical Data: Chest pain.  CHEST - 2 VIEW  Comparison: None.  Findings: Mild cardiomegaly. Coarse and prominent perihilar interstitial markings without confluent airspace infiltrate or overt edema.  No effusion. Bridging osteophytes through the mid and lower thoracic spine.  IMPRESSION:  1.  Mild cardiomegaly with central pulmonary vascular congestion.   Original Report Authenticated By: Osa Craver, M.D.    Nm Renal Imaging Flow W/pharm  04/14/2012  *RADIOLOGY REPORT*  Clinical Data:  Evaluate right hydronephrosis.  NUCLEAR MEDICINE RENAL SCAN WITH DIURETIC ADMINISTRATION  Technique:  Radionuclide angiographic and sequential renal images were obtained after intravenous injection of radiopharmaceutical. Imaging was continued during slow intravenous injection of Lasix approximately 15 minutes after the start of the examination. 60 mg Lasix was given.  Radiopharmaceutical:  13.9 mCi Tc-3m MAG3  Comparison:  Abdominal CT 03/1931 1013  Findings: There is slightly decreased flow to the right kidney compared to the left.  The differential renal function is 56.5% on the left and 43.5% on the right. There is spontaneous excretion from the left kidney prior to the administration of Lasix.  The T 1/2 left pre Lasix time is 23.5 minutes and post Lasix is 54 minutes.  The T 1/2 right pre Lasix time is 0 and post Lasix is 479.5 minutes.  There is no significant excretion identified from the right upper renal collecting system on this examination.  IMPRESSION: The differential renal function is 56.5% on the left and 43.5% on the right.  High-grade right hydronephrosis and obstruction.   Original Report Authenticated By: Richarda Overlie, M.D.    US Renal  04/11/2012  *RADIOLOGY REPORT*  Clinical Data: Renal insufficiency.  Hypertension and diabetes  RENAL/URINARY TRACT ULTRASOUND COMPLETE  Comparison:  None.  Findings:  Right Kidney:  15.7 cm in length.  There is marked hydronephrosis on the right.  Cortex appears thinned which may indicate chronic obstruction.  Suboptimal images of the right kidney due to body habitus  Left Kidney:  14.4 cm.  Negative for obstruction.  13 mm cyst.  Bladder:  Urinary bladder is filled without mass.  Ureteral jets are not identified.  Examination is significantly limited by body habitus  IMPRESSION: Marked hydronephrosis on the right.  No obstruction of the left kidney.   Original Report  Authenticated By: Camelia Phenes, M.D.     Microbiology: Recent Results (from the past 240 hour(s))  MRSA PCR SCREENING     Status: Normal   Collection Time   04/10/12  2:22 PM      Component Value Range Status Comment   MRSA by PCR NEGATIVE  NEGATIVE Final   SURGICAL PCR SCREEN     Status: Normal   Collection Time   04/14/12  9:53 PM      Component Value Range Status Comment   MRSA, PCR NEGATIVE  NEGATIVE Final    Staphylococcus aureus NEGATIVE  NEGATIVE Final      Labs: Basic Metabolic Panel:  Lab 04/18/12 6295 04/17/12 0500 04/16/12 0500 04/15/12 0403 04/14/12 0419  NA 136 137 137 136 137  K 3.6 3.7 3.9 3.9 4.3  CL 99 99 98 100 100  CO2 26 24 25 24 25   GLUCOSE 112* 129* 130* 81 100*  BUN 76* 75* 76* 74* 66*  CREATININE 2.88* 2.71* 2.80* 2.91* 3.03*  CALCIUM 8.4 8.6 8.7 8.6 9.1  MG -- -- -- -- --  PHOS 4.7* 4.8* 5.4* 5.1* 5.0*   Liver Function Tests:  Lab 04/18/12 0500 04/17/12 0500 04/16/12 0500 04/15/12 0403 04/14/12 0419  AST -- -- -- -- --  ALT -- -- -- -- --  ALKPHOS -- -- -- -- --  BILITOT -- -- -- -- --  PROT -- -- -- -- --  ALBUMIN 2.7* 3.1* 2.9* 2.8* 3.0*   No results found for this basename: LIPASE:5,AMYLASE:5 in the last 168 hours No results found for this basename: AMMONIA:5 in the last 168 hours CBC:  Lab 04/12/12 0500  WBC 4.7  NEUTROABS --  HGB 8.9*  HCT 27.2*  MCV 86.3  PLT 254   Cardiac Enzymes: No results found for this basename: CKTOTAL:5,CKMB:5,CKMBINDEX:5,TROPONINI:5 in the last 168 hours BNP: BNP (last 3 results)  Basename 04/09/12 1146  PROBNP 14183.0*   CBG:  Lab 04/18/12 0753 04/17/12 2203 04/17/12 1625 04/17/12 1157 04/17/12 0748  GLUCAP 145* 126* 140* 137* 136*       Signed:  Michaiah Maiden  [facility] 04/18/2012, 11:40 AM

## 2012-04-18 NOTE — Progress Notes (Signed)
Per MD order, PICC line removed. Cath intact at 41cm. Vaseline pressure gauze to site, pressure held x 5min. No bleeding to site. Pt instructed to keep dressing CDI x 24 hours. Avoid heavy lifting, pushing or pulling x 24 hours,  If bleeding occurs hold pressure, if bleeding does not stop contact MD or go to the ED. Pt does not have any questions.  Tim Herrera M  

## 2012-05-22 ENCOUNTER — Other Ambulatory Visit (HOSPITAL_COMMUNITY): Payer: Self-pay | Admitting: *Deleted

## 2012-05-23 ENCOUNTER — Encounter (HOSPITAL_COMMUNITY)
Admission: RE | Admit: 2012-05-23 | Discharge: 2012-05-23 | Disposition: A | Discharge status: Home / Self Care | Payer: Medicare Other | Source: Ambulatory Visit | Attending: Nephrology | Admitting: Nephrology

## 2012-05-23 DIAGNOSIS — N184 Chronic kidney disease, stage 4 (severe): Secondary | ICD-10-CM | POA: Insufficient documentation

## 2012-05-23 MED ORDER — DARBEPOETIN ALFA-POLYSORBATE 100 MCG/0.5ML IJ SOLN
INTRAMUSCULAR | Status: AC
  Filled 2012-05-23: qty 0.5

## 2012-05-23 MED ORDER — DARBEPOETIN ALFA-POLYSORBATE 100 MCG/0.5ML IJ SOLN
100.0000 ug | INTRAMUSCULAR | Status: DC
  Administered 2012-05-23: 100 ug via SUBCUTANEOUS

## 2012-05-25 ENCOUNTER — Other Ambulatory Visit: Payer: Medicare Other

## 2012-06-19 ENCOUNTER — Encounter (HOSPITAL_COMMUNITY): Payer: Medicare Other

## 2012-06-22 ENCOUNTER — Other Ambulatory Visit (HOSPITAL_COMMUNITY): Payer: Self-pay

## 2012-06-23 ENCOUNTER — Encounter (HOSPITAL_COMMUNITY)
Admission: RE | Admit: 2012-06-23 | Discharge: 2012-06-23 | Disposition: A | Discharge status: Home / Self Care | Payer: Medicare Other | Source: Ambulatory Visit | Attending: Nephrology | Admitting: Nephrology

## 2012-06-23 DIAGNOSIS — N184 Chronic kidney disease, stage 4 (severe): Secondary | ICD-10-CM | POA: Insufficient documentation

## 2012-06-23 MED ORDER — FERUMOXYTOL INJECTION 510 MG/17 ML
INTRAVENOUS | Status: AC
  Administered 2012-06-23: 510 mg via INTRAVENOUS
  Filled 2012-06-23: qty 17

## 2012-06-23 MED ORDER — FERUMOXYTOL INJECTION 510 MG/17 ML
510.0000 mg | INTRAVENOUS | Status: DC
  Administered 2012-06-23: 510 mg via INTRAVENOUS

## 2012-06-23 MED ORDER — SODIUM CHLORIDE 0.9 % IV SOLN
INTRAVENOUS | Status: AC
  Administered 2012-06-23: 12:00:00 via INTRAVENOUS

## 2012-06-27 ENCOUNTER — Other Ambulatory Visit: Payer: Self-pay | Admitting: Urology

## 2012-06-30 ENCOUNTER — Encounter (HOSPITAL_COMMUNITY)
Admission: RE | Admit: 2012-06-30 | Discharge: 2012-06-30 | Disposition: A | Discharge status: Home / Self Care | Payer: Medicare Other | Source: Ambulatory Visit | Attending: Nephrology | Admitting: Nephrology

## 2012-06-30 LAB — RENAL FUNCTION PANEL
Albumin: 3.3 g/dL — ABNORMAL LOW (ref 3.5–5.2)
BUN: 35 mg/dL — ABNORMAL HIGH (ref 6–23)
Chloride: 108 mEq/L (ref 96–112)
Creatinine, Ser: 2.44 mg/dL — ABNORMAL HIGH (ref 0.50–1.35)
Glucose, Bld: 120 mg/dL — ABNORMAL HIGH (ref 70–99)

## 2012-06-30 LAB — POCT HEMOGLOBIN-HEMACUE: Hemoglobin: 9.5 g/dL — ABNORMAL LOW (ref 13.0–17.0)

## 2012-06-30 MED ORDER — DARBEPOETIN ALFA-POLYSORBATE 100 MCG/0.5ML IJ SOLN
100.0000 ug | INTRAMUSCULAR | Status: DC
  Administered 2012-06-30: 100 ug via SUBCUTANEOUS

## 2012-06-30 MED ORDER — FERUMOXYTOL INJECTION 510 MG/17 ML
510.0000 mg | INTRAVENOUS | Status: AC
  Administered 2012-06-30: 510 mg via INTRAVENOUS

## 2012-06-30 MED ORDER — SODIUM CHLORIDE 0.9 % IV SOLN
INTRAVENOUS | Status: AC
  Administered 2012-06-30: 14:00:00 via INTRAVENOUS

## 2012-07-07 ENCOUNTER — Other Ambulatory Visit (HOSPITAL_COMMUNITY): Payer: Self-pay | Admitting: *Deleted

## 2012-07-07 ENCOUNTER — Other Ambulatory Visit: Payer: Self-pay | Admitting: Urology

## 2012-07-10 ENCOUNTER — Encounter (HOSPITAL_COMMUNITY)
Admission: RE | Admit: 2012-07-10 | Discharge: 2012-07-10 | Disposition: A | Discharge status: Home / Self Care | Payer: Medicare Other | Source: Ambulatory Visit | Attending: Urology | Admitting: Urology

## 2012-07-10 ENCOUNTER — Encounter (HOSPITAL_COMMUNITY): Payer: Self-pay

## 2012-07-10 ENCOUNTER — Encounter (HOSPITAL_COMMUNITY): Payer: Self-pay | Admitting: Pharmacy Technician

## 2012-07-10 HISTORY — DX: Anemia, unspecified: D64.9

## 2012-07-10 LAB — CBC
HCT: 31.5 % — ABNORMAL LOW (ref 39.0–52.0)
Hemoglobin: 9.9 g/dL — ABNORMAL LOW (ref 13.0–17.0)
MCV: 89.2 fL (ref 78.0–100.0)
Platelets: 215 10*3/uL (ref 150–400)
RDW: 15.7 % — ABNORMAL HIGH (ref 11.5–15.5)
WBC: 4.1 10*3/uL (ref 4.0–10.5)

## 2012-07-10 LAB — SURGICAL PCR SCREEN
MRSA, PCR: INVALID — AB
Staphylococcus aureus: INVALID — AB

## 2012-07-10 LAB — BASIC METABOLIC PANEL
Chloride: 102 mEq/L (ref 96–112)
Creatinine, Ser: 2.56 mg/dL — ABNORMAL HIGH (ref 0.50–1.35)
GFR calc Af Amer: 27 mL/min — ABNORMAL LOW (ref >90)
GFR calc non Af Amer: 23 mL/min — ABNORMAL LOW (ref >90)
Potassium: 4.2 mEq/L (ref 3.5–5.1)

## 2012-07-10 NOTE — H&P (Signed)
History of Present Illness             This is a new patient seen in consultation for CRF, right hydronephrosis and Rt UPJ obstruction referred by Dr. Abel Presto. Pt Cr increased from 1.0 in 2012 up to 3.0 by Nov 2013. He was found to have mild right hydronephrosis by ultrasound in May 2013. In addition he has bilateral simple renal cyst of no clinical significance but a renal ultrasound done in June 2013 was read as revealing moderate hydronephrosis. A CT Oct 2013 revealed severe right sided hydronephrosis. I reviewed all the images.    He has admitted to hospital Nov 2013. He has multiple medical comorbidities including HTN, DM, CAD, CHF, CM. With medical therapy his Cr improved to 2.9/GFR 06 May 2012 prior to the stent. A Mag-3 lasix showed 56% fxn left, 43% fxn right with a right UPJ obs and no washout. He underwent right ureteral stent placement 04/2012 by my colleague Dr. Margarita Grizzle. The right ureter was incompletely evaluated. His Cr returned to 2.4/GFR 29-14 Jul 2011.  He has had no gross hematuria. He voids with a good stream. 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     Current Meds 1. Albuterol Sulfate (2.5 MG/3ML) 0.083% Inhalation Nebulization Solution; Therapy:  (Recorded:14Jan2014) to 2. Aspirin 81 MG Oral Tablet; Therapy: (Recorded:14Jan2014) to 3. Carvedilol 3.125 MG Oral Tablet; Therapy: (Recorded:14Jan2014) to 4. Ferrous Sulfate 325 (65 Fe) MG Oral Tablet; Therapy: (Recorded:14Jan2014) to 5. Furosemide 20 MG Oral Tablet; Therapy: (Recorded:14Jan2014) to 6. Glimepiride 2 MG Oral Tablet; Therapy: (Recorded:14Jan2014) to 7. HydrALAZINE HCl 25 MG Oral Tablet; Therapy: (Recorded:14Jan2014) to 8. Isosorbide Mononitrate ER 30 MG Oral Tablet Extended Release 24 Hour; Therapy:  (Recorded:14Jan2014) to 9. Klor-Con M20 20 MEQ Oral Tablet Extended Release; Therapy: (Recorded:14Jan2014) to 10. Lipitor 20 MG Oral Tablet; Therapy: (Recorded:14Jan2014) to 11. MetFORMIN  HCl 500 MG Oral Tablet; Therapy: (Recorded:14Jan2014) to 12. Spironolactone 25 MG Oral Tablet; Therapy: (Recorded:14Jan2014) to  Allergies Medication  1. No Known Drug Allergies  Family History Problems  1. Family history of  Death In The Family Father Died age 37 Old age 52. Family history of  Death In The Family Mother Died age 75-Unknown causes 3. Family history of  Family Health Status Number Of Children 1 son  Social History Problems    Alcohol Use   Caffeine Use 2 per day   Marital History - Currently Married   Never A Smoker   Occupation: driver Denied    History of  Tobacco Use 305.1  Review of Systems Genitourinary, constitutional, skin, eye, otolaryngeal, hematologic/lymphatic, cardiovascular, pulmonary, endocrine, musculoskeletal, gastrointestinal, neurological and psychiatric system(s) were reviewed and pertinent findings if present are noted.    Vitals Vital Signs [Data Includes: Last 1 Day]  14Jan2014 03:01PM  BMI Calculated: 43.74 BSA Calculated: 2.52 Height: 5 ft 10.5 in Weight: 309 lb  Blood Pressure: 161 / 98 Temperature: 97.8 F Heart Rate: 84  Physical Exam Constitutional: Well nourished and well developed . No acute distress.  ENT:. The ears and nose are normal in appearance.  Neck: The appearance of the neck is normal and no neck mass is present.  Pulmonary: No respiratory distress and normal respiratory rhythm and effort.  Cardiovascular: Heart rate and rhythm are normal . No peripheral edema.  Abdomen: The abdomen is soft and nontender. No masses are palpated. No CVA tenderness. No hernias are palpable. No hepatosplenomegaly noted.  Lymphatics: The femoral and inguinal nodes are  not enlarged or tender.  Skin: Normal skin turgor, no visible rash and no visible skin lesions.  Neuro/Psych:. Mood and affect are appropriate.    Results/Data Urine [Data Includes: Last 1 Day]   14Jan2014  COLOR YELLOW   APPEARANCE CLEAR   SPECIFIC  GRAVITY 1.025   pH 6.0   GLUCOSE NEG mg/dL  BILIRUBIN NEG   KETONE NEG mg/dL  BLOOD LARGE   PROTEIN > 300 mg/dL  UROBILINOGEN 1 mg/dL  NITRITE NEG   LEUKOCYTE ESTERASE NEG   SQUAMOUS EPITHELIAL/HPF FEW   WBC 0-2 WBC/hpf  RBC 7-10 RBC/hpf  BACTERIA RARE   CRYSTALS NONE SEEN   CASTS NONE SEEN    Assessment Assessed  1. Hydronephrosis On The Right 591 2. Renal Insufficiency 593.9  Plan Health Maintenance (V70.0)  1. UA With REFLEX  Done: 14Jan2014 02:24PM Hydronephrosis On The Right (591)  2. Follow-up Schedule Surgery Office  Follow-up  Requested for: 14Jan2014 Hydronephrosis On The Right (591), Renal Insufficiency (593.9)  3. Get Outside Records Office  Follow-up  Requested for: 13Jan2014  Discussion/Summary      I discussed with the patient his GFR improved from 23 - 30 speaking to his nephrologist Dr. Arrie Aran this stage V renal failure to stage IV. The patient's comorbidities he certainly has some underlying medical renal failure and at this point a pyeloplasty risk may outweigh the benefit. Therefore periodic stent changes over the next year or two to see if the patient's creatinine stabilizes would be more appropriate.  I discussed with the patient in nature risks and benefits of cystoscopy with serial stent changes. We discussed alternatives such as removal of the stent or pyeloplasty. Given his ureter and UPJ or incompletely evaluated and recommended a cystoscopy, right retrograde pyelogram, right ureteroscopy with this first and exchange and we discussed the nature risks and benefits of these evaluations. All questions answered.      Signatures Electronically signed by : Jerilee Field, M.D.; Jun 27 2012  3:15PM

## 2012-07-10 NOTE — Progress Notes (Signed)
07/10/12 1424  OBSTRUCTIVE SLEEP APNEA  Have you ever been diagnosed with sleep apnea through a sleep study? No  Do you snore loudly (loud enough to be heard through closed doors)?  1  Do you often feel tired, fatigued, or sleepy during the daytime? 0  Has anyone observed you stop breathing during your sleep? 0  Do you have, or are you being treated for high blood pressure? 1  BMI more than 35 kg/m2? 0  Age over 75 years old? 1  Neck circumference greater than 40 cm/18 inches? 0  Gender: 1  Obstructive Sleep Apnea Score 4   Score 4 or greater  Results sent to PCP

## 2012-07-10 NOTE — Patient Instructions (Signed)
20      Your procedure is scheduled on: Tuesday 07/11/2012   Report to [facility] at  0830 AM.  Call this number if you have problems the morning of surgery: [phone]   Remember:             IF YOU USE CPAP,BRING MASK AND TUBING AM OF SURGERY!   Do not eat food or drink liquids AFTER MIDNIGHT!  Take these medicines the morning of surgery with A SIP OF WATER: Carvedilol, Isosorbide, Hydralazine, Atorvastatin, use Albuterol inhaler if needed and bring with you to hospital!   Do not bring valuables to the hospital.  .  Leave suitcase in the car. After surgery it may be brought to your room.  For patients admitted to the hospital, checkout time is 11:00 AM the day of              Discharge.    DO NOT WEAR JEWELRY , MAKE-UP, LOTIONS,POWDERS,PERFUMES!             WOMEN -DO NOT SHAVE LEGS OR UNDERARMS 12 HRS. BEFORE SURGERY!               MEN MAY SHAVE AS USUAL!             CONTACTS,DENTURES OR BRIDGEWORK, FALSE EYELASHES MAY NOT BE WORN INTO SURGERY!                                           Patients discharged the day of surgery will not be allowed to drive home.If going home the same day of surgery, must have someone stay with youfirst 24 hrs.at home and arrange for someone to drive you home from the              Hospital.              YOUR DRIVER UJ:[id number]   Special Instructions:             Please read over the following fact sheets that you were given:             1. [facility] PREPARING FOR SURGERY SHEET              2.MRSA INFORMATION              3.[facility]                                        Telford Nab.Talar Fraley,RN,BSN     [phone]                FAILURE TO FOLLOW THESE INSTRUCTIONS MAY RESULT IN  CANCELLATION OF YOUR SURGERY!               Patient Signature:___________________________

## 2012-07-11 ENCOUNTER — Encounter (HOSPITAL_COMMUNITY): Payer: Self-pay | Admitting: Anesthesiology

## 2012-07-11 ENCOUNTER — Ambulatory Visit (HOSPITAL_BASED_OUTPATIENT_CLINIC_OR_DEPARTMENT_OTHER): Admission: RE | Admit: 2012-07-11 | Payer: Medicare Other | Source: Ambulatory Visit | Admitting: Urology

## 2012-07-11 ENCOUNTER — Ambulatory Visit (HOSPITAL_COMMUNITY): Payer: Medicare Other | Admitting: Anesthesiology

## 2012-07-11 ENCOUNTER — Encounter (HOSPITAL_BASED_OUTPATIENT_CLINIC_OR_DEPARTMENT_OTHER): Admission: RE | Payer: Self-pay | Source: Ambulatory Visit

## 2012-07-11 ENCOUNTER — Ambulatory Visit (HOSPITAL_COMMUNITY)
Admission: RE | Admit: 2012-07-11 | Discharge: 2012-07-11 | Disposition: A | Discharge status: Home / Self Care | Payer: Medicare Other | Source: Ambulatory Visit | Attending: Urology | Admitting: Urology

## 2012-07-11 ENCOUNTER — Encounter (HOSPITAL_COMMUNITY)
Admission: RE | Disposition: A | Discharge status: Home / Self Care | Payer: Self-pay | Source: Ambulatory Visit | Attending: Urology

## 2012-07-11 DIAGNOSIS — N189 Chronic kidney disease, unspecified: Secondary | ICD-10-CM | POA: Insufficient documentation

## 2012-07-11 DIAGNOSIS — N1330 Unspecified hydronephrosis: Secondary | ICD-10-CM | POA: Insufficient documentation

## 2012-07-11 DIAGNOSIS — E119 Type 2 diabetes mellitus without complications: Secondary | ICD-10-CM | POA: Insufficient documentation

## 2012-07-11 DIAGNOSIS — I2510 Atherosclerotic heart disease of native coronary artery without angina pectoris: Secondary | ICD-10-CM | POA: Insufficient documentation

## 2012-07-11 DIAGNOSIS — Z79899 Other long term (current) drug therapy: Secondary | ICD-10-CM | POA: Insufficient documentation

## 2012-07-11 DIAGNOSIS — I129 Hypertensive chronic kidney disease with stage 1 through stage 4 chronic kidney disease, or unspecified chronic kidney disease: Secondary | ICD-10-CM | POA: Insufficient documentation

## 2012-07-11 DIAGNOSIS — N135 Crossing vessel and stricture of ureter without hydronephrosis: Secondary | ICD-10-CM | POA: Insufficient documentation

## 2012-07-11 DIAGNOSIS — Z01812 Encounter for preprocedural laboratory examination: Secondary | ICD-10-CM | POA: Insufficient documentation

## 2012-07-11 LAB — GLUCOSE, CAPILLARY
Glucose-Capillary: 92 mg/dL (ref 70–99)
Glucose-Capillary: 73 mg/dL (ref 70–99)

## 2012-07-11 SURGERY — CYSTOURETEROSCOPY, WITH STENT INSERTION
Anesthesia: General | Laterality: Right

## 2012-07-11 SURGERY — CYSTOURETEROSCOPY, WITH STENT INSERTION
Anesthesia: General | Site: Ureter | Laterality: Right | Wound class: Contaminated

## 2012-07-11 MED ORDER — LIDOCAINE HCL 2 % EX GEL
CUTANEOUS | Status: AC
  Filled 2012-07-11: qty 10

## 2012-07-11 MED ORDER — IOHEXOL 300 MG/ML  SOLN
INTRAMUSCULAR | Status: AC
  Filled 2012-07-11: qty 1

## 2012-07-11 MED ORDER — FUROSEMIDE 10 MG/ML IJ SOLN
INTRAMUSCULAR | Status: DC | PRN
  Administered 2012-07-11: 10 mg via INTRAMUSCULAR

## 2012-07-11 MED ORDER — CEFAZOLIN SODIUM-DEXTROSE 2-3 GM-% IV SOLR
INTRAVENOUS | Status: AC
  Filled 2012-07-11: qty 50

## 2012-07-11 MED ORDER — DEXTROSE 5 % IV SOLN
3.0000 g | INTRAVENOUS | Status: AC
  Administered 2012-07-11: 3 g via INTRAVENOUS
  Filled 2012-07-11: qty 3000

## 2012-07-11 MED ORDER — ONDANSETRON HCL 4 MG/2ML IJ SOLN
INTRAMUSCULAR | Status: DC | PRN
  Administered 2012-07-11: 4 mg via INTRAVENOUS

## 2012-07-11 MED ORDER — PROPOFOL 10 MG/ML IV BOLUS
INTRAVENOUS | Status: DC | PRN
  Administered 2012-07-11: 200 mg via INTRAVENOUS

## 2012-07-11 MED ORDER — ACETAMINOPHEN 10 MG/ML IV SOLN
INTRAVENOUS | Status: AC
  Filled 2012-07-11: qty 100

## 2012-07-11 MED ORDER — PROMETHAZINE HCL 25 MG/ML IJ SOLN: 6.2500 mg | INTRAMUSCULAR | Status: DC | PRN

## 2012-07-11 MED ORDER — LACTATED RINGERS IV SOLN
INTRAVENOUS | Status: DC | PRN
  Administered 2012-07-11: 10:00:00 via INTRAVENOUS

## 2012-07-11 MED ORDER — FUROSEMIDE 10 MG/ML IJ SOLN
10.0000 mg | Freq: Once | INTRAMUSCULAR | Status: DC
  Filled 2012-07-11 (×3): qty 1

## 2012-07-11 MED ORDER — FENTANYL CITRATE 0.05 MG/ML IJ SOLN: 25.0000 ug | INTRAMUSCULAR | Status: DC | PRN

## 2012-07-11 MED ORDER — ACETAMINOPHEN 10 MG/ML IV SOLN
INTRAVENOUS | Status: DC | PRN
  Administered 2012-07-11: 1000 mg via INTRAVENOUS

## 2012-07-11 MED ORDER — CEFAZOLIN SODIUM 1-5 GM-% IV SOLN
INTRAVENOUS | Status: AC
  Filled 2012-07-11: qty 50

## 2012-07-11 MED ORDER — CARVEDILOL 3.125 MG PO TABS
3.1250 mg | ORAL_TABLET | Freq: Once | ORAL | Status: AC
  Administered 2012-07-11: 3.125 mg via ORAL
  Filled 2012-07-11: qty 1

## 2012-07-11 MED ORDER — LACTATED RINGERS IV SOLN
INTRAVENOUS | Status: DC
  Administered 2012-07-11: 1000 mL via INTRAVENOUS

## 2012-07-11 MED ORDER — FENTANYL CITRATE 0.05 MG/ML IJ SOLN
INTRAMUSCULAR | Status: DC | PRN
  Administered 2012-07-11 (×2): 50 ug via INTRAVENOUS

## 2012-07-11 MED ORDER — SODIUM CHLORIDE 0.9 % IR SOLN
Status: DC | PRN
  Administered 2012-07-11: 1000 mL

## 2012-07-11 SURGICAL SUPPLY — 15 items
ADAPTER CATH URET PLST 4-6FR (CATHETERS) ×2 IMPLANT
BAG URO CATCHER STRL LF (DRAPE) ×2 IMPLANT
CATH URET 5FR 28IN CONE TIP (BALLOONS) ×1
CATH URET 5FR 70CM CONE TIP (BALLOONS) ×1 IMPLANT
CLOTH BEACON ORANGE TIMEOUT ST (SAFETY) ×2 IMPLANT
DRAPE CAMERA CLOSED 9X96 (DRAPES) ×2 IMPLANT
GLOVE BIOGEL M 8.0 STRL (GLOVE) ×2 IMPLANT
GOWN PREVENTION PLUS XLARGE (GOWN DISPOSABLE) ×2 IMPLANT
GOWN STRL REIN XL XLG (GOWN DISPOSABLE) ×2 IMPLANT
GUIDEWIRE STR DUAL SENSOR (WIRE) ×4 IMPLANT
MANIFOLD NEPTUNE II (INSTRUMENTS) ×2 IMPLANT
PACK CYSTO (CUSTOM PROCEDURE TRAY) ×2 IMPLANT
STENT CONTOUR 6FRX26X.038 (STENTS) ×2 IMPLANT
TUBING CONNECTING 10 (TUBING) ×2 IMPLANT
WIRE COONS/BENSON .038X145CM (WIRE) ×2 IMPLANT

## 2012-07-11 NOTE — Anesthesia Postprocedure Evaluation (Signed)
  Anesthesia Post-op Note  Patient: Tim Herrera  Procedure(s) Performed: Procedure(s) (LRB): CYSTOSCOPY WITH URETEROSCOPY AND STENT PLACEMENT (Right)  Patient Location: PACU  Anesthesia Type: General  Level of Consciousness: awake and alert   Airway and Oxygen Therapy: Patient Spontanous Breathing  Post-op Pain: mild  Post-op Assessment: Post-op Vital signs reviewed, Patient's Cardiovascular Status Stable, Respiratory Function Stable, Patent Airway and No signs of Nausea or vomiting  Last Vitals:  Filed Vitals:   07/11/12 1300  BP: 185/87  Pulse: 66  Temp:   Resp: 15    Post-op Vital Signs: stable   Complications: No apparent anesthesia complications

## 2012-07-11 NOTE — Progress Notes (Signed)
Patient did not bring inhaler as instructed

## 2012-07-11 NOTE — Progress Notes (Signed)
Page into DrEskridge re: elevated BNP

## 2012-07-11 NOTE — Anesthesia Preprocedure Evaluation (Addendum)
Anesthesia Evaluation  Patient identified by MRN, date of birth, ID band Patient awake    Reviewed: Allergy & Precautions, H&P , NPO status , Patient's Chart, lab work & pertinent test results  Airway Mallampati: II TM Distance: >3 FB Neck ROM: Full    Dental No notable dental hx.    Pulmonary neg pulmonary ROS,  breath sounds clear to auscultation  Pulmonary exam normal       Cardiovascular hypertension, Pt. on medications and Pt. on home beta blockers + CAD and +CHF Rhythm:Regular Rate:Normal     Neuro/Psych negative neurological ROS  negative psych ROS   GI/Hepatic negative GI ROS, Neg liver ROS,   Endo/Other  diabetes, Type 2, Oral Hypoglycemic Agents  Renal/GU Renal InsufficiencyRenal diseaseCr 2.56  negative genitourinary   Musculoskeletal negative musculoskeletal ROS (+)   Abdominal (+) + obese,   Peds negative pediatric ROS (+)  Hematology negative hematology ROS (+)   Anesthesia Other Findings   Reproductive/Obstetrics negative OB ROS                           Anesthesia Physical Anesthesia Plan  ASA: III  Anesthesia Plan: General   Post-op Pain Management:    Induction: Intravenous  Airway Management Planned: LMA  Additional Equipment:   Intra-op Plan:   Post-operative Plan: Extubation in OR  Informed Consent: I have reviewed the patients History and Physical, chart, labs and discussed the procedure including the risks, benefits and alternatives for the proposed anesthesia with the patient or authorized representative who has indicated his/her understanding and acceptance.   Dental advisory given  Plan Discussed with: CRNA  Anesthesia Plan Comments: (BNP is elevated today, but not as elevated as it was with cystoscopy on 04/15/12. Patient aymptomatic for dyspnea. Minor rales at lung bases. Room air oxygen saturations 97-98%. After discussion with Dr. Mena Goes we will  proceed with this short procedure.)       Anesthesia Quick Evaluation

## 2012-07-11 NOTE — Transfer of Care (Signed)
Immediate Anesthesia Transfer of Care Note  Patient: Tim Herrera  Procedure(s) Performed: Procedure(s) (LRB) with comments: CYSTOSCOPY WITH URETEROSCOPY AND STENT PLACEMENT (Right) - stent change , left and right bladder biopsy  Patient Location: PACU  Anesthesia Type:General  Level of Consciousness: awake and alert   Airway & Oxygen Therapy: Patient Spontanous Breathing  Post-op Assessment: Report given to PACU RN and Post -op Vital signs reviewed and stable  Post vital signs: Reviewed and stable  Complications: No apparent anesthesia complications

## 2012-07-11 NOTE — Op Note (Signed)
Preoperative diagnosis: Chronic renal failure, right hydronephrosis, right ureteropelvic junction obstruction Postoperative diagnosis: Chronic renal failure, right hydronephrosis, right ureteropelvic junction obstruction  Procedure Exam under anesthesia Cystoscopy Right ureteroscopy with stent removal Right diagnostic ureteroscopy Right ureteral stent placement  Surgeon: Mena Goes  Anesthesia: Denney  Type of Anesthesia: Gen.  Findings: Exam under anesthesia-the patient is uncircumcised the foreskin retracted without difficulty and had no lesions or masses. The penis was normal without lesions or masses. Testicles were descended bilaterally and without palpable nodule. On bimanual exam the bladder and lower abdomen contained no palpable masses. On digital rectal exam the prostate was mildly enlarged but smooth without hard area or nodule.  Cystoscopy-the urethra appeared normal, the prostatic urethra showed obstructing lateral lobe hypertrophy and a small median lobe. The trigone and ureteral orifice these were in their normal orthotopic position. The stent was not visualized but on fluoroscopy it had been pulled into the ureteral lumen. There were no foreign bodies in the bladder, stones or papillary tumors. There was some flat lesions that appeared to be submucosal hemorrhage with some inflammatory changes to the mucosa scattered throughout the base of the bladder. This could be consistent with stent irritation but the stent was within the ureter. There were no other concerning findings. The bladder was examined with a 12 and 70 lens.  Ureteroscopy-the ureter was normal without mass lesion or stricture. The UPJ appeared to have a high insertion but was visually patent with normal mucosa. I was able to retroflex the ureteroscope to see the wire and ureteroscope in the ureteropelvic junction. The upper middle and lower portions of the collecting system were all visualized and noted to be  normal.  Description of procedure: After consent was obtained patient was brought to the operating room. After adequate anesthesia he was placed in lithotomy position. He was prepped and draped in usual fashion. A time out was performed to confirm the patient and procedure. An exam under anesthesia was performed. The cystoscope was passed per urethra and the bladder inspected with the 12 and 70 lens. Given the changes to the mucosa Rep. Portions were biopsied with the rigid biopsy forceps on the left the bladder and right bladder. The biopsy sites were fulgurated with the Bugbee electrode and had adequate hemostasis. The bladder was drained and the scope removed.  The ureteroscope, semirigid was advanced into the right ureteral orifice where the end of the stent was grasped and pulled into the bladder. The ureteroscope was removed and the cystoscope replaced. The stent was removed through the urethral meatus and a sensor wire was advanced into the kidney and coiled under fluoroscopy. The rigid ureteroscope was used to inspect the distal and mid ureter but I could not get over the iliacs therefore I passed a second sensor wire and removed the rigid scope.   Over the second wire the digital ureteroscope was advanced into the collecting system. The collecting system and the UPJ were inspected in its entirety. The ureter was then inspected in its entirety. No concerning findings were found.   The wire was backloaded on the cystoscope and a 6 x 26 cm stent was placed with a good coil seen in the kidney and a good coil in the bladder. Bladder was drained and the scope removed.  Complications: None  Blood loss: Minimal  Specimens to pathology: #1 left bladder biopsy, #2 right bladder biopsy   Drains: 6x26 cm right ureteral stent  Disposition: Patient stable to PACU

## 2012-07-11 NOTE — Interval H&P Note (Signed)
History and Physical Interval Note:  07/11/2012 11:00 AM  Tim Herrera  has presented today for surgery, with the diagnosis of renal failure and hydronephrosis   The various methods of treatment have been discussed with the patient and family. After consideration of risks, benefits and other options for treatment, the patient has consented to  Procedure(s) (LRB) with comments: CYSTOSCOPY WITH URETEROSCOPY AND STENT PLACEMENT (Right) - stent change  as a surgical intervention .  The patient's history has been reviewed, patient examined, no change in status, stable for surgery.  I have reviewed the patient's chart and labs.  I reviewed the BNP with Dr. Council Mechanic and he is comfortable proceeding from an anesthesia point of view. I also discussed with the patient the side effects of the proposed treatment, the likelihood of the patient achieving the goals of the procedure, and any potential problems that might occur during the procedure or recuperation. All questions answered. Patient elects to proceed.     Antony Haste

## 2012-07-13 LAB — MRSA CULTURE

## 2012-07-28 ENCOUNTER — Encounter (HOSPITAL_COMMUNITY)
Admission: RE | Admit: 2012-07-28 | Discharge: 2012-07-28 | Disposition: A | Discharge status: Home / Self Care | Payer: Medicare Other | Source: Ambulatory Visit | Attending: Nephrology | Admitting: Nephrology

## 2012-07-28 DIAGNOSIS — N184 Chronic kidney disease, stage 4 (severe): Secondary | ICD-10-CM | POA: Insufficient documentation

## 2012-07-28 LAB — RENAL FUNCTION PANEL
Albumin: 3.2 g/dL — ABNORMAL LOW (ref 3.5–5.2)
BUN: 43 mg/dL — ABNORMAL HIGH (ref 6–23)
CO2: 23 mEq/L (ref 19–32)
Chloride: 109 mEq/L (ref 96–112)
GFR calc non Af Amer: 23 mL/min — ABNORMAL LOW (ref >90)
Potassium: 4.7 mEq/L (ref 3.5–5.1)

## 2012-07-28 LAB — IRON AND TIBC
Iron: 34 ug/dL — ABNORMAL LOW (ref 42–135)
TIBC: 155 ug/dL — ABNORMAL LOW (ref 215–435)

## 2012-07-28 LAB — FERRITIN: Ferritin: 490 ng/mL — ABNORMAL HIGH (ref 22–322)

## 2012-07-28 MED ORDER — DARBEPOETIN ALFA-POLYSORBATE 100 MCG/0.5ML IJ SOLN
INTRAMUSCULAR | Status: AC
  Filled 2012-07-28: qty 0.5

## 2012-07-28 MED ORDER — DARBEPOETIN ALFA-POLYSORBATE 100 MCG/0.5ML IJ SOLN
100.0000 ug | INTRAMUSCULAR | Status: DC
  Administered 2012-07-28: 100 ug via SUBCUTANEOUS

## 2012-08-24 ENCOUNTER — Other Ambulatory Visit (HOSPITAL_COMMUNITY): Payer: Self-pay | Admitting: *Deleted

## 2012-08-25 ENCOUNTER — Encounter (HOSPITAL_COMMUNITY)
Admission: RE | Admit: 2012-08-25 | Discharge: 2012-08-25 | Disposition: A | Discharge status: Home / Self Care | Payer: Medicare Other | Source: Ambulatory Visit | Attending: Nephrology | Admitting: Nephrology

## 2012-08-25 DIAGNOSIS — N184 Chronic kidney disease, stage 4 (severe): Secondary | ICD-10-CM | POA: Insufficient documentation

## 2012-08-25 MED ORDER — DARBEPOETIN ALFA-POLYSORBATE 100 MCG/0.5ML IJ SOLN
INTRAMUSCULAR | Status: AC
  Filled 2012-08-25: qty 0.5

## 2012-08-25 MED ORDER — DARBEPOETIN ALFA-POLYSORBATE 100 MCG/0.5ML IJ SOLN
100.0000 ug | INTRAMUSCULAR | Status: DC
  Administered 2012-08-25: 100 ug via SUBCUTANEOUS

## 2012-09-03 ENCOUNTER — Encounter (HOSPITAL_COMMUNITY): Payer: Self-pay | Admitting: Emergency Medicine

## 2012-09-03 ENCOUNTER — Emergency Department (HOSPITAL_COMMUNITY): Payer: Medicare Other

## 2012-09-03 ENCOUNTER — Inpatient Hospital Stay (HOSPITAL_COMMUNITY)
Admission: EM | Admit: 2012-09-03 | Discharge: 2012-09-06 | DRG: 292 | Disposition: A | Discharge status: Skilled Nursing Facility | Payer: Medicare Other | Attending: Internal Medicine | Admitting: Internal Medicine

## 2012-09-03 DIAGNOSIS — E66813 Obesity, class 3: Secondary | ICD-10-CM

## 2012-09-03 DIAGNOSIS — I5043 Acute on chronic combined systolic (congestive) and diastolic (congestive) heart failure: Principal | ICD-10-CM

## 2012-09-03 DIAGNOSIS — D649 Anemia, unspecified: Secondary | ICD-10-CM

## 2012-09-03 DIAGNOSIS — E119 Type 2 diabetes mellitus without complications: Secondary | ICD-10-CM

## 2012-09-03 DIAGNOSIS — I10 Essential (primary) hypertension: Secondary | ICD-10-CM

## 2012-09-03 DIAGNOSIS — I129 Hypertensive chronic kidney disease with stage 1 through stage 4 chronic kidney disease, or unspecified chronic kidney disease: Secondary | ICD-10-CM | POA: Diagnosis present

## 2012-09-03 DIAGNOSIS — I509 Heart failure, unspecified: Secondary | ICD-10-CM

## 2012-09-03 DIAGNOSIS — N039 Chronic nephritic syndrome with unspecified morphologic changes: Secondary | ICD-10-CM | POA: Diagnosis present

## 2012-09-03 DIAGNOSIS — K760 Fatty (change of) liver, not elsewhere classified: Secondary | ICD-10-CM

## 2012-09-03 DIAGNOSIS — D631 Anemia in chronic kidney disease: Secondary | ICD-10-CM | POA: Diagnosis present

## 2012-09-03 DIAGNOSIS — N184 Chronic kidney disease, stage 4 (severe): Secondary | ICD-10-CM

## 2012-09-03 DIAGNOSIS — Z6841 Body Mass Index (BMI) 40.0 and over, adult: Secondary | ICD-10-CM

## 2012-09-03 DIAGNOSIS — R9431 Abnormal electrocardiogram [ECG] [EKG]: Secondary | ICD-10-CM

## 2012-09-03 DIAGNOSIS — I2510 Atherosclerotic heart disease of native coronary artery without angina pectoris: Secondary | ICD-10-CM

## 2012-09-03 DIAGNOSIS — N1330 Unspecified hydronephrosis: Secondary | ICD-10-CM

## 2012-09-03 HISTORY — DX: Cyst of kidney, acquired: N28.1

## 2012-09-03 HISTORY — DX: Chronic kidney disease, stage 3 (moderate): N18.3

## 2012-09-03 HISTORY — DX: Fatty (change of) liver, not elsewhere classified: K76.0

## 2012-09-03 HISTORY — DX: Acute on chronic combined systolic (congestive) and diastolic (congestive) heart failure: I50.43

## 2012-09-03 LAB — CBC WITH DIFFERENTIAL/PLATELET
Eosinophils Absolute: 0.5 10*3/uL (ref 0.0–0.7)
Eosinophils Relative: 12 % — ABNORMAL HIGH (ref 0–5)
Hemoglobin: 9.8 g/dL — ABNORMAL LOW (ref 13.0–17.0)
Lymphs Abs: 0.4 10*3/uL — ABNORMAL LOW (ref 0.7–4.0)
MCH: 28.5 pg (ref 26.0–34.0)
MCV: 89.5 fL (ref 78.0–100.0)
Monocytes Relative: 15 % — ABNORMAL HIGH (ref 3–12)
RBC: 3.44 MIL/uL — ABNORMAL LOW (ref 4.22–5.81)

## 2012-09-03 LAB — COMPREHENSIVE METABOLIC PANEL
BUN: 44 mg/dL — ABNORMAL HIGH (ref 6–23)
Calcium: 7.9 mg/dL — ABNORMAL LOW (ref 8.4–10.5)
GFR calc Af Amer: 26 mL/min — ABNORMAL LOW (ref >90)
Glucose, Bld: 113 mg/dL — ABNORMAL HIGH (ref 70–99)
Total Protein: 6.9 g/dL (ref 6.0–8.3)

## 2012-09-03 LAB — URINALYSIS, ROUTINE W REFLEX MICROSCOPIC
Protein, ur: 100 mg/dL — AB
Urobilinogen, UA: 1 mg/dL (ref 0.0–1.0)

## 2012-09-03 LAB — TROPONIN I
Troponin I: <0.3 ng/mL (ref <0.30)
Troponin I: <0.3 ng/mL (ref <0.30)

## 2012-09-03 MED ORDER — HYDRALAZINE HCL 25 MG PO TABS
12.5000 mg | ORAL_TABLET | Freq: Three times a day (TID) | ORAL | Status: DC
  Administered 2012-09-03 – 2012-09-06 (×9): 12.5 mg via ORAL
  Filled 2012-09-03 (×11): qty 0.5

## 2012-09-03 MED ORDER — FUROSEMIDE 10 MG/ML IJ SOLN: 60.0000 mg | Freq: Four times a day (QID) | INTRAMUSCULAR | Status: DC

## 2012-09-03 MED ORDER — ISOSORBIDE MONONITRATE ER 30 MG PO TB24
30.0000 mg | ORAL_TABLET | Freq: Every day | ORAL | Status: DC
Start: 2012-09-04 — End: 2012-09-06
  Administered 2012-09-04 – 2012-09-06 (×3): 30 mg via ORAL
  Filled 2012-09-03 (×3): qty 1

## 2012-09-03 MED ORDER — ACETAMINOPHEN 325 MG PO TABS: 650.0000 mg | ORAL_TABLET | Freq: Four times a day (QID) | ORAL | Status: DC | PRN

## 2012-09-03 MED ORDER — FUROSEMIDE 10 MG/ML IJ SOLN
80.0000 mg | Freq: Once | INTRAMUSCULAR | Status: AC
  Administered 2012-09-03: 80 mg via INTRAVENOUS
  Filled 2012-09-03: qty 8

## 2012-09-03 MED ORDER — ALBUTEROL SULFATE HFA 108 (90 BASE) MCG/ACT IN AERS: 2.0000 | INHALATION_SPRAY | RESPIRATORY_TRACT | Status: DC | PRN

## 2012-09-03 MED ORDER — ACETAMINOPHEN 650 MG RE SUPP: 650.0000 mg | Freq: Four times a day (QID) | RECTAL | Status: DC | PRN

## 2012-09-03 MED ORDER — ASPIRIN EC 81 MG PO TBEC
81.0000 mg | DELAYED_RELEASE_TABLET | Freq: Every morning | ORAL | Status: DC
  Administered 2012-09-04 – 2012-09-06 (×3): 81 mg via ORAL
  Filled 2012-09-03 (×3): qty 1

## 2012-09-03 MED ORDER — FUROSEMIDE 10 MG/ML IJ SOLN
80.0000 mg | Freq: Three times a day (TID) | INTRAMUSCULAR | Status: DC
  Administered 2012-09-03 – 2012-09-04 (×2): 80 mg via INTRAVENOUS
  Filled 2012-09-03 (×5): qty 8

## 2012-09-03 MED ORDER — SODIUM CHLORIDE 0.9 % IJ SOLN
3.0000 mL | Freq: Two times a day (BID) | INTRAMUSCULAR | Status: DC
  Administered 2012-09-03 – 2012-09-06 (×5): 3 mL via INTRAVENOUS

## 2012-09-03 MED ORDER — MORPHINE SULFATE 2 MG/ML IJ SOLN: 1.0000 mg | INTRAMUSCULAR | Status: DC | PRN

## 2012-09-03 MED ORDER — SODIUM CHLORIDE 0.9 % IJ SOLN: 3.0000 mL | INTRAMUSCULAR | Status: DC | PRN

## 2012-09-03 MED ORDER — SODIUM CHLORIDE 0.9 % IV SOLN: 250.0000 mL | INTRAVENOUS | Status: DC | PRN

## 2012-09-03 MED ORDER — KETOROLAC TROMETHAMINE 30 MG/ML IJ SOLN: 30.0000 mg | Freq: Once | INTRAMUSCULAR | Status: DC

## 2012-09-03 MED ORDER — HEPARIN SODIUM (PORCINE) 5000 UNIT/ML IJ SOLN
5000.0000 [IU] | Freq: Three times a day (TID) | INTRAMUSCULAR | Status: DC
  Administered 2012-09-03 – 2012-09-06 (×8): 5000 [IU] via SUBCUTANEOUS
  Filled 2012-09-03 (×11): qty 1

## 2012-09-03 MED ORDER — INSULIN ASPART 100 UNIT/ML ~~LOC~~ SOLN
0.0000 [IU] | Freq: Three times a day (TID) | SUBCUTANEOUS | Status: DC
  Administered 2012-09-04: 2 [IU] via SUBCUTANEOUS
  Administered 2012-09-04 – 2012-09-05 (×3): 1 [IU] via SUBCUTANEOUS
  Administered 2012-09-05: 7 [IU] via SUBCUTANEOUS
  Administered 2012-09-06 (×2): 1 [IU] via SUBCUTANEOUS

## 2012-09-03 MED ORDER — CARVEDILOL 3.125 MG PO TABS
3.1250 mg | ORAL_TABLET | Freq: Two times a day (BID) | ORAL | Status: DC
  Administered 2012-09-04 – 2012-09-06 (×6): 3.125 mg via ORAL
  Filled 2012-09-03 (×9): qty 1

## 2012-09-03 MED ORDER — HYDROCODONE-ACETAMINOPHEN 5-325 MG PO TABS
1.0000 | ORAL_TABLET | ORAL | Status: DC | PRN
  Filled 2012-09-03: qty 2

## 2012-09-03 MED ORDER — SODIUM CHLORIDE 0.9 % IJ SOLN
3.0000 mL | Freq: Two times a day (BID) | INTRAMUSCULAR | Status: DC
  Administered 2012-09-03 – 2012-09-06 (×4): 3 mL via INTRAVENOUS

## 2012-09-03 MED ORDER — DOCUSATE SODIUM 100 MG PO CAPS
100.0000 mg | ORAL_CAPSULE | Freq: Two times a day (BID) | ORAL | Status: DC
  Administered 2012-09-03 – 2012-09-06 (×6): 100 mg via ORAL
  Filled 2012-09-03 (×7): qty 1

## 2012-09-03 NOTE — H&P (Signed)
[facility] History and Physical  Jatin Naumann ZOX:[id number] DOB: Aug 25, 1935 DOA: 09/03/2012  Referring physician: Dr Silverio Lay.  PCP: [name], MD  Specialists: Dr Arrie Aran  Chief Complaint: SOB, increase generalized  swelling.   HPI: Tsutomu Barfoot is a 77 y.o. male with PMH significant for Systolic and Diastolic HF, last EF at 30 by ECHO 2013, Stage IV CKD, Right side hydronephrosis S?P ureteral stent placement 07-11-2012, Diabetes, who presents to hospital complaining of worsening generalize swelling, and SOB. He relates 30 pounds weight gain over last 3 weeks. He relates no problem with urination, denies decreases  urine output. His lasix dose was increase from 40 mg BID to 120 mg BID last Friday (3-21) by his Nephrologist. He has not notice any improvement with increase lasix doses. He relates worsening SOB on ambulation over last couple of days. He relates increase swelling legs, arms, abdomen. He denies chest pain. He denies diet indiscretion.   Review of Systems: The patient denies anorexia, fever, weight loss,, vision loss, decreased hearing, hoarseness, chest pain, syncope, hemoptysis, abdominal pain, melena, hematochezia, severe indigestion/heartburn, hematuria, incontinence, genital sores, muscle weakness, suspicious skin lesions, transient blindness, difficulty walking, depression, unusual weight change, abnormal bleeding,  Past Medical History  Diagnosis Date  . Hypertension   . Diabetes mellitus   . Cardiomyopathy, nonischemic   . CAD (coronary artery disease)     Cardiac cath May 2012 with mild plaque LAD and Circumflex and severe disease in  very small PDA branch of RCA.   Marland Kitchen CHF (congestive heart failure)   . Anemia   . Fatty liver disease, nonalcoholic   . Renal cyst    Past Surgical History  Procedure Laterality Date  . Cystoscopy w/ ureteral stent placement  04/15/2012    Procedure: CYSTOSCOPY WITH RETROGRADE PYELOGRAM/URETERAL STENT PLACEMENT;  Surgeon: Milford Cage, MD;  Location: [facility];  Service: Urology;  Laterality: Right;  Cysto/Right Retrograde Pyelogram/Right Ureteral Stent  . Tonsillectomy      as child   Social History:  reports that he has never smoked. He does not have any smokeless tobacco history on file. He reports that he does not drink alcohol or use illicit drugs.Lives at home with wife.   No Known Allergies  Family History: Father Died age of 25. Mother died age 68 of unknown causes.   Prior to Admission medications   Medication Sig Start Date End Date Taking? Authorizing Provider  albuterol (VENTOLIN HFA) 108 (90 BASE) MCG/ACT inhaler Inhale 2 puffs into the lungs every 4 (four) hours as needed. For shortness of breath.   Yes Historical Provider, MD  aspirin EC 81 MG tablet Take 81 mg by mouth every morning.    Yes Historical Provider, MD  carvedilol (COREG) 3.125 MG tablet Take 3.125 mg by mouth 2 (two) times daily with a meal.   Yes Historical Provider, MD  darbepoetin (ARANESP) 100 MCG/0.5ML SOLN Inject 100 mcg into the skin once.    Yes Historical Provider, MD  furosemide (LASIX) 80 MG tablet Take 120 mg by mouth 2 (two) times daily. 09/01/12 09/08/12 Yes Historical Provider, MD  hydrALAZINE (APRESOLINE) 25 MG tablet Take 12.5 mg by mouth 3 (three) times daily.    Yes Historical Provider, MD  isosorbide mononitrate (IMDUR) 30 MG 24 hr tablet Take 1 tablet (30 mg total) by mouth daily. 01/26/12 01/25/13 Yes [name], PA-C   Physical Exam: Filed Vitals:   09/03/12 1411 09/03/12 1416  BP:  149/82  Pulse:  76  Temp:  98.1 F (36.7 C)  TempSrc:  Oral  Resp:  18  SpO2: 97% 98%   General Appearance:    Alert, cooperative, no distress, appears stated age, morbid obese, anasarca.  Head:    Normocephalic, without obvious abnormality, atraumatic  Eyes:    PERRL, conjunctiva/corneas clear, EOM's intact        Ears:    Normal TM's and external ear canals, both ears  Nose:   Nares normal, septum midline, mucosa  normal, no drainage    or sinus tenderness  Throat:   Lips, mucosa, and tongue normal; teeth and gums normal  Neck:   Supple, symmetrical, trachea midline, no adenopathy;       thyroid:  No enlargement/tenderness/nodules; no carotid   bruit , thick neck positive  JVD  Back:     Symmetric, no curvature, ROM normal, no CVA tenderness  Lungs:     No wheezes, crackles bases, respirations unlabored  Chest wall:    No tenderness or deformity  Heart:    Regular rate and rhythm, S1 and S2 normal, no murmur, rub   or gallop  Abdomen:     Soft, non-tender, bowel sounds active all four quadrants,    no masses, no organomegaly, edema subcutaneous tissue.         Extremities:   Extremities normal, atraumatic, edema up to thigh.   Pulses:   2+ and symmetric all extremities  Skin:   Skin color, texture, turgor normal, no rashes or lesions  Lymph nodes:   Cervical, supraclavicular, and axillary nodes normal  Neurologic:   CNII-XII intact. Normal strength, sensation and reflexes      throughout      Labs on Admission:  Basic Metabolic Panel:  Recent Labs Lab 09/03/12 1538  NA 141  K 4.8  CL 108  CO2 20  GLUCOSE 113*  BUN 44*  CREATININE 2.65*  CALCIUM 7.9*   Liver Function Tests:  Recent Labs Lab 09/03/12 1538  AST 16  ALT 11  ALKPHOS 319*  BILITOT 0.6  PROT 6.9  ALBUMIN 3.0*   CBC:  Recent Labs Lab 09/03/12 1538  WBC 4.1  NEUTROABS 2.6  HGB 9.8*  HCT 30.8*  MCV 89.5  PLT 243   Cardiac Enzymes:  Recent Labs Lab 09/03/12 1538  TROPONINI <0.30    BNP (last 3 results)  Recent Labs  04/09/12 1146 07/10/12 1330 09/03/12 1538  PROBNP 14183.0* 12233.0* 25299.0*   CBG: No results found for this basename: GLUCAP,  in the last 168 hours  Radiological Exams on Admission: Dg Chest 2 View  09/03/2012  *RADIOLOGY REPORT*  Clinical Data: Lower extremity edema.  Shortness of breath. Weakness.  Cardiomyopathy.  Congestive heart failure.  CHEST - 2 VIEW  Comparison:  04/12/2012  Findings: Technical factors related to patient body habitus reduce diagnostic sensitivity and specificity.  Moderate cardiomegaly noted with pulmonary vascular indistinctness and mild interstitial accentuation.  No overt airspace edema noted.  Posterior costophrenic angles somewhat indistinct, possibly due to body habitus.  I cannot exclude small pleural effusions.  Mild lower thoracic spondylosis noted.  1.  Moderate cardiomegaly with suspected interstitial edema.   Original Report Authenticated By: Gaylyn Rong, M.D.     EKG: Independently reviewed. Sinus, First degree AV block.   Assessment/Plan Principal Problem:   Acute on chronic combined systolic and diastolic CHF (congestive heart failure) Active Problems:   HTN (hypertension)   CKD (chronic kidney disease) stage 3, GFR 30-59 ml/min   DM (diabetes  mellitus)   Anemia   Obesity, Class III, BMI 40-49.9 (morbid obesity)   Hydronephrosis of right kidney  1-Acute on Chronic Systolic and Diastolic HF: Patient last EF was at 30 % by ECHO 2013, he presents with worsening edema and dyspnea. Elevated BNP at 2529. Probably fluid retention secondary to renal failure. I will start Lasix 80 mg IV every 8 hours. Fulid restriction. Strict I and O. I will repeat ECHO. Cycle cardiac enzymes. Continue with Coreg, Imdur, hydralazine.  2-CKD stage IV: Last Cr per records at 2.58. Will start lasix for fluid retention. I will order renal US to evaluate ureteral stent. Will consult renal in am.  3-History of Diabetes: not on medications. Monitor CBG. SSI. Check HB-A1c.  4-History right side hydronephrosis S/P ureteral stent: Check [place] evaluate stent.     Code Status: Full Code.  Family Communication: Care discussed with Wife and daughter.  Disposition Plan: Expect 5 days inpatient  Time spent: 70 minutes.   REGALADO,BELKYS [facility] Pager [phone]  If 7PM-7AM, please contact night-coverage [email or web address] Password  [facility] 09/03/2012, 5:56 PM

## 2012-09-03 NOTE — ED Notes (Signed)
BJY:[id number]<FA> Expected date:<BR> Expected time:<BR> Means of arrival:<BR> Comments:<BR> 77 y/o M leg swelling

## 2012-09-03 NOTE — ED Notes (Signed)
Pt notified that urine is needed 

## 2012-09-03 NOTE — ED Notes (Signed)
Per EMS.  Pt from home.  PMH of CHF.  Pt states that for past couple of weeks he has had increased abd and bilateral leg swelling.  States that today he is having bad groin swelling.  Hurt to point he states it was hard to get out of bed.  Pt has ureter stent and stated that urine output has been normal today.  Denied SOB, but does get dyspneic on exertion.  97% on RA.

## 2012-09-03 NOTE — ED Provider Notes (Signed)
History     CSN: [id number]  Arrival date & time 09/03/12  1408   First MD Initiated Contact with Patient 09/03/12 1506      Chief Complaint  Patient presents with  . Groin Swelling  . Leg Swelling    (Consider location/radiation/quality/duration/timing/severity/associated sxs/prior treatment) The history is provided by the patient.  Tim Herrera is a 77 y.o. male history of CHF, CAD, tension, diabetes here presenting with swelling and shortness of breath. Bilateral leg swelling for the last week getting worse. Over the last week he noticed that his groin and his abdomen and his hands became more swollen as well. He has chronic shortness of breath that's getting worse especially with exertion. Denies sleeping on more pillows or waking up at night short of breath. He was admitted several months ago for similar symptoms and was diagnosed with CHF exacerbation. He has ureteral stents and denies any urinary symptoms. He saw PMD several days ago and lasix was increased from 40mg  BID to 120mg  BID 2 days ago.    Past Medical History  Diagnosis Date  . Hypertension   . Diabetes mellitus   . Cardiomyopathy, nonischemic   . CAD (coronary artery disease)     Cardiac cath May 2012 with mild plaque LAD and Circumflex and severe disease in  very small PDA branch of RCA.   Marland Kitchen CHF (congestive heart failure)   . Anemia   . Fatty liver disease, nonalcoholic   . Renal cyst     Past Surgical History  Procedure Laterality Date  . Cystoscopy w/ ureteral stent placement  04/15/2012    Procedure: CYSTOSCOPY WITH RETROGRADE PYELOGRAM/URETERAL STENT PLACEMENT;  Surgeon: Milford Cage, MD;  Location: [facility];  Service: Urology;  Laterality: Right;  Cysto/Right Retrograde Pyelogram/Right Ureteral Stent  . Tonsillectomy      as child    History reviewed. No pertinent family history.  History  Substance Use Topics  . Smoking status: Never Smoker   . Smokeless tobacco: Not on file  . Alcohol  Use: No      Review of Systems  Respiratory: Positive for shortness of breath.   Cardiovascular: Positive for leg swelling.  All other systems reviewed and are negative.    Allergies  Review of patient's allergies indicates no known allergies.  Home Medications   Current Outpatient Rx  Name  Route  Sig  Dispense  Refill  . albuterol (VENTOLIN HFA) 108 (90 BASE) MCG/ACT inhaler   Inhalation   Inhale 2 puffs into the lungs every 4 (four) hours as needed. For shortness of breath.         Marland Kitchen aspirin EC 81 MG tablet   Oral   Take 81 mg by mouth every morning.          . carvedilol (COREG) 3.125 MG tablet   Oral   Take 3.125 mg by mouth 2 (two) times daily with a meal.         . darbepoetin (ARANESP) 100 MCG/0.5ML SOLN   Subcutaneous   Inject 100 mcg into the skin once.          . furosemide (LASIX) 80 MG tablet   Oral   Take 120 mg by mouth 2 (two) times daily.         . hydrALAZINE (APRESOLINE) 25 MG tablet   Oral   Take 12.5 mg by mouth 3 (three) times daily.          . isosorbide mononitrate (IMDUR) 30 MG 24  hr tablet   Oral   Take 1 tablet (30 mg total) by mouth daily.   30 tablet   6     BP 149/82  Pulse 76  Temp(Src) 98.1 F (36.7 C) (Oral)  Resp 18  SpO2 98%  Physical Exam  Nursing note and vitals reviewed. Constitutional: He is oriented to person, place, and time.  Obese, slightly uncomfortable   HENT:  Head: Normocephalic.  Mouth/Throat: Oropharynx is clear and moist.  Eyes: Conjunctivae are normal. Pupils are equal, round, and reactive to light.  Neck: Normal range of motion. Neck supple.  Cardiovascular: Normal rate, regular rhythm and normal heart sounds.   Pulmonary/Chest: Effort normal.  + bibasilar crackles   Abdominal:  Obese, soft, diffuse edema, not tender   Musculoskeletal: He exhibits no tenderness.  3+ edema bilateral legs   Neurological: He is alert and oriented to person, place, and time.  Skin: Skin is warm and  dry.  Psychiatric: He has a normal mood and affect. His behavior is normal. Judgment and thought content normal.    ED Course  Procedures (including critical care time)  Labs Reviewed  CBC WITH DIFFERENTIAL - Abnormal; Notable for the following:    RBC 3.44 (*)    Hemoglobin 9.8 (*)    HCT 30.8 (*)    RDW 16.9 (*)    Lymphocytes Relative 9 (*)    Lymphs Abs 0.4 (*)    Monocytes Relative 15 (*)    Eosinophils Relative 12 (*)    All other components within normal limits  COMPREHENSIVE METABOLIC PANEL - Abnormal; Notable for the following:    Glucose, Bld 113 (*)    BUN 44 (*)    Creatinine, Ser 2.65 (*)    Calcium 7.9 (*)    Albumin 3.0 (*)    Alkaline Phosphatase 319 (*)    GFR calc non Af Amer 22 (*)    GFR calc Af Amer 26 (*)    All other components within normal limits  PRO B NATRIURETIC PEPTIDE - Abnormal; Notable for the following:    Pro B Natriuretic peptide (BNP) 25299.0 (*)    All other components within normal limits  TROPONIN I  URINALYSIS, ROUTINE W REFLEX MICROSCOPIC   Dg Chest 2 View  09/03/2012  *RADIOLOGY REPORT*  Clinical Data: Lower extremity edema.  Shortness of breath. Weakness.  Cardiomyopathy.  Congestive heart failure.  CHEST - 2 VIEW  Comparison: 04/12/2012  Findings: Technical factors related to patient body habitus reduce diagnostic sensitivity and specificity.  Moderate cardiomegaly noted with pulmonary vascular indistinctness and mild interstitial accentuation.  No overt airspace edema noted.  Posterior costophrenic angles somewhat indistinct, possibly due to body habitus.  I cannot exclude small pleural effusions.  Mild lower thoracic spondylosis noted.  1.  Moderate cardiomegaly with suspected interstitial edema.   Original Report Authenticated By: Gaylyn Rong, M.D.      1. CHF exacerbation      Date: 09/03/2012  Rate: 75  Rhythm: normal sinus rhythm  QRS Axis: normal  Intervals: normal  ST/T Wave abnormalities: nonspecific ST  changes  Conduction Disutrbances:first-degree A-V block   Narrative Interpretation:   Old EKG Reviewed: none available    MDM  Tim Herrera is a 77 y.o. male here with SOB, diffuse swelling. Likely CHF exacerbation. Will get labs, BNP, CXR. Will likely need admission for CHF.   5:23 PM BNP elevated, CXR showed interstitial edema. Cr at baseline. Given lasix 80mg  IV and admitted to tele. I talked  to Dr. [name] who accepted the patient.        Richardean Canal, MD 09/03/12 [phone]

## 2012-09-04 ENCOUNTER — Encounter (HOSPITAL_COMMUNITY): Payer: Self-pay | Admitting: Nephrology

## 2012-09-04 ENCOUNTER — Encounter: Payer: Self-pay | Admitting: Vascular Surgery

## 2012-09-04 ENCOUNTER — Other Ambulatory Visit: Payer: Self-pay

## 2012-09-04 ENCOUNTER — Inpatient Hospital Stay (HOSPITAL_COMMUNITY): Payer: Medicare Other

## 2012-09-04 DIAGNOSIS — N184 Chronic kidney disease, stage 4 (severe): Secondary | ICD-10-CM

## 2012-09-04 DIAGNOSIS — Z01810 Encounter for preprocedural cardiovascular examination: Secondary | ICD-10-CM

## 2012-09-04 DIAGNOSIS — I5043 Acute on chronic combined systolic (congestive) and diastolic (congestive) heart failure: Principal | ICD-10-CM

## 2012-09-04 DIAGNOSIS — N186 End stage renal disease: Secondary | ICD-10-CM

## 2012-09-04 DIAGNOSIS — I2510 Atherosclerotic heart disease of native coronary artery without angina pectoris: Secondary | ICD-10-CM

## 2012-09-04 DIAGNOSIS — I509 Heart failure, unspecified: Secondary | ICD-10-CM

## 2012-09-04 DIAGNOSIS — I379 Nonrheumatic pulmonary valve disorder, unspecified: Secondary | ICD-10-CM

## 2012-09-04 LAB — TROPONIN I: Troponin I: <0.3 ng/mL (ref <0.30)

## 2012-09-04 LAB — GLUCOSE, CAPILLARY
Glucose-Capillary: 161 mg/dL — ABNORMAL HIGH (ref 70–99)
Glucose-Capillary: 139 mg/dL — ABNORMAL HIGH (ref 70–99)
Glucose-Capillary: 127 mg/dL — ABNORMAL HIGH (ref 70–99)

## 2012-09-04 LAB — CBC
HCT: 30.1 % — ABNORMAL LOW (ref 39.0–52.0)
MCHC: 32.9 g/dL (ref 30.0–36.0)
MCV: 88 fL (ref 78.0–100.0)
RDW: 17.1 % — ABNORMAL HIGH (ref 11.5–15.5)

## 2012-09-04 LAB — RENAL FUNCTION PANEL
BUN: 46 mg/dL — ABNORMAL HIGH (ref 6–23)
Chloride: 106 mEq/L (ref 96–112)
Glucose, Bld: 102 mg/dL — ABNORMAL HIGH (ref 70–99)
Potassium: 4.5 mEq/L (ref 3.5–5.1)

## 2012-09-04 LAB — HEMOGLOBIN A1C: Mean Plasma Glucose: 128 mg/dL — ABNORMAL HIGH (ref <117)

## 2012-09-04 MED ORDER — FUROSEMIDE 10 MG/ML IJ SOLN
120.0000 mg | Freq: Three times a day (TID) | INTRAVENOUS | Status: DC
  Administered 2012-09-04 – 2012-09-05 (×3): 120 mg via INTRAVENOUS
  Filled 2012-09-04 (×4): qty 12

## 2012-09-04 NOTE — Progress Notes (Addendum)
[facility] [facility]  No vascular procedures are completed at [facility].  If hemodialysis access desired this admission, transfer patient to [facility] to facilitate placement.  Vein mapping ordered.  Patient will be seen once vein mapping completed and patient has been seen by Nephrology.  Leonides Sake, MD [facility] [place] Office: [phone] Pager: [phone]  09/04/2012, 1:38 PM

## 2012-09-04 NOTE — Progress Notes (Signed)
[facility] PROGRESS NOTE  Tim Herrera ZOX:[id number] DOB: 05-20-1936 DOA: 09/03/2012 PCP: [name], MD  Assessment/Plan:  1-Acute on Chronic Systolic and Diastolic HF: Patient last EF was at 30 % by ECHO 2013, he presents with worsening edema and dyspnea, anasarca. Elevated BNP at 2529.  Fulid restriction. Strict I and O.  ECHO EF at 35 %. Troponin time 3 negative. Continue with Coreg, Imdur, hydralazine. Negative 2L.  2-CKD stage IV: Last Cr per records at 2.58. Renal US: Markedly limited study secondary to body habitus. Mild fullness of the right intrarenal collecting system. Good urine out put. Lasix change to 120 mg IV every 8 hours. Appreciate Dr Arlean Hopping help. Had vascular evaluation for permanent access for dialysis.   3-History of Diabetes: not on medications. Monitor CBG. SSI. Check HB-A1c.  4-History right side hydronephrosis S/P ureteral stent: mild fullness of right intrarenal collecting system.    Code Status: Full Family Communication: Care discussed with patient. Disposition Plan: to be determine.    Consultants:  Dr Arlean Hopping  Procedures:  none  Antibiotics:  none  HPI/Subjective: Thinking about getting permanent access for dialysis. No worsening SOB.  Objective: Filed Vitals:   09/03/12 1850 09/03/12 2037 09/04/12 0500 09/04/12 1429  BP: 169/89 156/75 187/87 157/76  Pulse:   85 73  Temp: 98 F (36.7 C) 97.9 F (36.6 C) 98.2 F (36.8 C) 97.2 F (36.2 C)  TempSrc: Oral Oral Oral Oral  Resp: 18 18 20 18   Height: 5\' 10"  (1.778 m)     Weight: 158.759 kg (350 lb)  161.6 kg (356 lb 4.2 oz)   SpO2: 95% 100% 100% 100%    Intake/Output Summary (Last 24 hours) at 09/04/12 2039 Last data filed at 09/04/12 1826  Gross per 24 hour  Intake   1022 ml  Output   2499 ml  Net  -1477 ml   Filed Weights   09/03/12 1850 09/04/12 0500  Weight: 158.759 kg (350 lb) 161.6 kg (356 lb 4.2 oz)    Exam:   General:  No distress, anasarca.    Cardiovascular: S 1, S RRR  Respiratory: Crackles bases  Abdomen: BS present, soft, NT  Musculoskeletal: plus 2 edema.   Data Reviewed: Basic Metabolic Panel:  Recent Labs Lab 09/03/12 1538 09/04/12 0618  NA 141 140  K 4.8 4.5  CL 108 106  CO2 20 20  GLUCOSE 113* 102*  BUN 44* 46*  CREATININE 2.65* 2.75*  CALCIUM 7.9* 8.5  PHOS  --  4.1  4.3   Liver Function Tests:  Recent Labs Lab 09/03/12 1538 09/04/12 0618  AST 16  --   ALT 11  --   ALKPHOS 319*  --   BILITOT 0.6  --   PROT 6.9  --   ALBUMIN 3.0* 3.1*   No results found for this basename: LIPASE, AMYLASE,  in the last 168 hours No results found for this basename: AMMONIA,  in the last 168 hours CBC:  Recent Labs Lab 09/03/12 1538 09/04/12 0618  WBC 4.1 4.2  NEUTROABS 2.6  --   HGB 9.8* 9.9*  HCT 30.8* 30.1*  MCV 89.5 88.0  PLT 243 218   Cardiac Enzymes:  Recent Labs Lab 09/03/12 1538 09/03/12 1921 09/04/12 09/04/12 0618  TROPONINI <0.30 <0.30 <0.30 <0.30   BNP (last 3 results)  Recent Labs  04/09/12 1146 07/10/12 1330 09/03/12 1538  PROBNP 14183.0* 12233.0* 25299.0*   CBG:  Recent Labs Lab 09/03/12 2202 09/04/12 0756 09/04/12 1145 09/04/12 1657  GLUCAP 136* 161* 100* 139*    No results found for this or any previous visit (from the past 240 hour(s)).   Studies: Dg Chest 2 View  09/03/2012  *RADIOLOGY REPORT*  Clinical Data: Lower extremity edema.  Shortness of breath. Weakness.  Cardiomyopathy.  Congestive heart failure.  CHEST - 2 VIEW  Comparison: 04/12/2012  Findings: Technical factors related to patient body habitus reduce diagnostic sensitivity and specificity.  Moderate cardiomegaly noted with pulmonary vascular indistinctness and mild interstitial accentuation.  No overt airspace edema noted.  Posterior costophrenic angles somewhat indistinct, possibly due to body habitus.  I cannot exclude small pleural effusions.  Mild lower thoracic spondylosis noted.  1.   Moderate cardiomegaly with suspected interstitial edema.   Original Report Authenticated By: Gaylyn Rong, M.D.    US Renal  09/04/2012  *RADIOLOGY REPORT*  Clinical Data: Fluid retention.  Hypertension.  RENAL / URINARY TRACT ULTRASOUND  Technique:  Complete ultrasound exam of the kidneys and urinary bladder was performed.  Comparison: CT scan from 04/12/2012.  Findings:  The right kidney measures 14.2 cm in long axis.  The left kidney measures 13.0 cm.  Both kidneys are difficult to visualize secondary to body habitus.  No evidence for a ureteral stent on either side, but this may be secondary to technical limitations. There does appear to be some fullness of the intrarenal collecting system on the right.  Urinary bladder is not visualized and may be decompressed.  There is prominent bowel gas in the pelvis and body habitus limits evaluation.  Impression:  Markedly limited study secondary to body habitus.  Mild fullness of the right intrarenal collecting system.   Original Report Authenticated By: Kennith Center, M.D.     Scheduled Meds: . aspirin EC  81 mg Oral q morning - 10a  . carvedilol  3.125 mg Oral BID WC  . docusate sodium  100 mg Oral BID  . furosemide  120 mg Intravenous Q8H  . heparin  5,000 Units Subcutaneous Q8H  . hydrALAZINE  12.5 mg Oral TID  . insulin aspart  0-9 Units Subcutaneous TID WC  . isosorbide mononitrate  30 mg Oral Daily  . sodium chloride  3 mL Intravenous Q12H  . sodium chloride  3 mL Intravenous Q12H   Continuous Infusions:   Principal Problem:   Acute on chronic combined systolic and diastolic CHF (congestive heart failure) Active Problems:   HTN (hypertension)   Chronic kidney disease (CKD), stage IV (severe)   DM (diabetes mellitus)   Anemia   Obesity, Class III, BMI 40-49.9 (morbid obesity)    Time spent: 25 minutes.    [name]  [facility] Pager [phone]. If 7PM-7AM, please contact night-coverage at [email or web address], password  [facility] 09/04/2012, 8:39 PM  LOS: 1 day

## 2012-09-04 NOTE — Progress Notes (Signed)
  Echocardiogram 2D Echocardiogram has been performed.  Tim Herrera 09/04/2012, 10:03 AM

## 2012-09-04 NOTE — Progress Notes (Signed)
   CARE MANAGEMENT NOTE 09/04/2012  Patient:  Tim Herrera, Tim Herrera   Account Number:  [id number]  Date Initiated:  09/04/2012  Documentation initiated by:  Jiles Crocker  Subjective/Objective Assessment:   ADMITTED WITH CHF     Action/Plan:   PCP: [name], MD  Specialists: Dr Arrie Aran  LIVES AT HOME WITH SPOUSE; POSSIBLY NEED HHC AT DISCHARGE; CM WILL CONTINUE TO FOLLOW FOR DCP   Anticipated DC Date:  09/08/2012   Anticipated DC Plan:  HOME W HOME HEALTH SERVICES      DC Planning Services  CM consult         Status of service:  In process, will continue to follow Medicare Important Message given?  NA - LOS <3 / Initial given by admissions (If response is "NO", the following Medicare IM given date fields will be blank) Per UR Regulation:  Reviewed for med. necessity/level of care/duration of stay Comments:  09/04/2012- B Harwood Nall RN,BSN,MHA

## 2012-09-04 NOTE — Progress Notes (Signed)
VASCULAR LAB PRELIMINARY  PRELIMINARY  PRELIMINARY  PRELIMINARY  Right  Upper Extremity Vein Map    Cephalic  Segment Diameter Depth Comment  1. Axilla 3.68mm mm   2. Mid upper arm 2.50mm mm   3. Above AC 2.74mm mm   4. In AC mm mm Too small to visualize distally  5. Below AC mm mm   6. Mid forearm mm mm   7. Wrist mm mm    mm mm    mm mm    mm mm    Basilic  Segment Diameter Depth Comment  1. Axilla mm mm   2. Mid upper arm 6.102mm 13.47mm Origin   3. Above [facility] 3.24mm 17mm   4. In [facility] 5.62mm 12.68mm   5. Below AC 3.31mm 7.33mm Multiple branches  6. Mid forearm 2.27mm 4.62mm   7. Wrist 2.35mm 6.11mm    mm mm    mm mm    mm mm      Left Upper Extremity Vein Map    Cephalic  Segment Diameter Depth Comment  1. Axilla 3.32mm mm   2. Mid upper arm 2.92mm mm   3. Above AC 2.41mm mm   4. In AC 2.96mm mm   5. Below AC 0.37mm mm Branch   6. Mid forearm mm mm Too small to visualize distally  7. Wrist mm mm    mm mm    mm mm    mm mm    Basilic  Segment Diameter Depth Comment  1. Axilla mm mm   2. Mid upper arm 4.67mm 15.58mm Origin   3. Above [facility] 3.37mm 10.58mm Branch   4. In [facility] 3.42mm 10.56mm Branch   5. Below AC 2.23mm 6.95mm   6. Mid forearm 2.99mm 2.18mm   7. Wrist 1.60mm 1.67mm    mm mm    mm mm    mm mm     Farrel Demark, RDMS, RVT  09/04/2012, 2:37 PM

## 2012-09-04 NOTE — Consult Note (Signed)
Tim Herrera 09/04/2012 Tim Herrera D Requesting Physician:  Dr [name]  Reason for Consult:  Renal failure HPI: The patient is a 77 y.o. year-old with hx of HTN, DM, NICM, CAD and CHF presented on 3/23 to ED with reports of increased abdominal and leg swelling, groin swelling, decreased mobility due to edema and DOE.  RA sat was 97%. He has hx of ureteral stents. Hx of CHF and was admitted several mos ago for CHF exacerbation.  He saw local MD recently and lasix dose was increased from 40 to 120 mg bid.   Patient was admitted for combined CHF, last EF was 30%.  He was started on IV lasix 80 every 8 hrs, home meds were continued.  Renal US was done which showed ?of mild fullness of R collecting system, but no severe hydro.  Today patient says he feels a lot better, swelling is coming down, no sob or cough.  Patient was admitted 10/27 - 04/18/2102 for combined CHF and CKD.  He was found to have severe R hydronephrosis due to UPJ obstruction.  He was seen by urology and on 04/15/12 underwent urology eval with cysto/RGP which showed tortuous R ureter and minimal dye passing into the collecting system.  Despite some difficulty, a wire was able to be passed into the R collecting system, and a ureteral stent was placed (6x24, double J). The 3 days after stent was placed, the creatinine came down only two tenths of a point and levelled off at 2.88.      ROS  no cp, no orthpnea, no cough  no abd pain , n/v/d  good appetite, no loss of energy  no HA or confusion  + swelling of legs, groin  no fevers    Past Medical History:  Past Medical History  Diagnosis Date  . Hypertension   . Diabetes mellitus   . Cardiomyopathy, nonischemic   . CAD (coronary artery disease)     Cardiac cath May 2012 with mild plaque LAD and Circumflex and severe disease in  very small PDA branch of RCA.   Marland Kitchen CHF (congestive heart failure)   . Anemia   . Fatty liver disease, nonalcoholic   . Renal cyst   . Morbid  obesity     Past Surgical History:  Past Surgical History  Procedure Laterality Date  . Cystoscopy w/ ureteral stent placement  04/15/2012    Procedure: CYSTOSCOPY WITH RETROGRADE PYELOGRAM/URETERAL STENT PLACEMENT;  Surgeon: Milford Cage, MD;  Location: [facility];  Service: Urology;  Laterality: Right;  Cysto/Right Retrograde Pyelogram/Right Ureteral Stent  . Tonsillectomy      as child    Family History: History reviewed. No pertinent family history. Social History:  reports that he has never smoked. He does not have any smokeless tobacco history on file. He reports that he does not drink alcohol or use illicit drugs.  Allergies: No Known Allergies  Home medications: Prior to Admission medications   Medication Sig Start Date End Date Taking? Authorizing Provider  albuterol (VENTOLIN HFA) 108 (90 BASE) MCG/ACT inhaler Inhale 2 puffs into the lungs every 4 (four) hours as needed. For shortness of breath.   Yes Historical Provider, MD  aspirin EC 81 MG tablet Take 81 mg by mouth every morning.    Yes Historical Provider, MD  carvedilol (COREG) 3.125 MG tablet Take 3.125 mg by mouth 2 (two) times daily with a meal.   Yes Historical Provider, MD  darbepoetin (ARANESP) 100 MCG/0.5ML SOLN Inject 100 mcg into  the skin once.    Yes Historical Provider, MD  furosemide (LASIX) 80 MG tablet Take 120 mg by mouth 2 (two) times daily. 09/01/12 09/08/12 Yes Historical Provider, MD  hydrALAZINE (APRESOLINE) 25 MG tablet Take 12.5 mg by mouth 3 (three) times daily.    Yes Historical Provider, MD  isosorbide mononitrate (IMDUR) 30 MG 24 hr tablet Take 1 tablet (30 mg total) by mouth daily. 01/26/12 01/25/13 Yes Tim Pais, PA-C  atorvastatin (LIPITOR) 20 MG tablet Take 20 mg by mouth daily.    Historical Provider, MD  calcitRIOL (ROCALTROL) 0.25 MCG capsule Take 0.25 mcg by mouth daily.    Historical Provider, MD  ferrous sulfate 325 (65 FE) MG tablet Take 325 mg by mouth daily with breakfast.     Historical Provider, MD  spironolactone (ALDACTONE) 25 MG tablet Take 25 mg by mouth daily.    Historical Provider, MD    Labs: Basic Metabolic Panel:  Recent Labs Lab 09/03/12 1538 09/04/12 0618  NA 141 140  K 4.8 4.5  CL 108 106  CO2 20 20  GLUCOSE 113* 102*  BUN 44* 46*  CREATININE 2.65* 2.75*  CALCIUM 7.9* 8.5  PHOS  --  4.1   Liver Function Tests:  Recent Labs Lab 09/03/12 1538 09/04/12 0618  AST 16  --   ALT 11  --   ALKPHOS 319*  --   BILITOT 0.6  --   PROT 6.9  --   ALBUMIN 3.0* 3.1*   No results found for this basename: LIPASE, AMYLASE,  in the last 168 hours No results found for this basename: AMMONIA,  in the last 168 hours CBC:  Recent Labs Lab 09/03/12 1538 09/04/12 0618  WBC 4.1 4.2  NEUTROABS 2.6  --   HGB 9.8* 9.9*  HCT 30.8* 30.1*  MCV 89.5 88.0  PLT 243 218   PT/INR: @LABRCNTIP (inr:5) Cardiac Enzymes: ) Recent Labs Lab 09/03/12 1538 09/03/12 1921 09/04/12 09/04/12 0618  TROPONINI <0.30 <0.30 <0.30 <0.30   CBG:  Recent Labs Lab 09/03/12 2202 09/04/12 0756 09/04/12 1145  GLUCAP 136* 161* 100*     Physical Exam:  Blood pressure 187/87, pulse 85, temperature 98.2 F (36.8 C), temperature source Oral, resp. rate 20, height 5\' 10"  (1.778 m), weight 161.6 kg (356 lb 4.2 oz), SpO2 100.00%.  Gen: obese AAM in no distress at 60 deg in the bed Skin: no rash, cyanosis HEENT:  EOMI, sclera anicteric, throat clear Neck: + JVD, no LAN, no bruits Chest: faint basilar rales R base, L is clear CV: regular, no rub or gallop, quiet precordium Abdomen: soft, obese, no ascites, no HSM, nontender Ext: 3+ diffuse bilateral LE edema extending to thight, scrotum/penis and abdominal wall in dependent areas, no joint effusion or deformity, no gangrene or ulceration Neuro: alert, Ox3, no focal deficit, no asterixis  > UA- 100 prot, 3-6 wbc, TNTC rbc 's, hyaline casts > CXR- CM, ? IS edema, no frank edema > Renal US-  13.0/14.2 cm kidneys,  poor study due to body habitus, no ureteral stents visualized, slight fullness of collecting system on right side, bladder not visualized > CT abdomen 04/12/2012 -- marked R sided hydronephrosis to the level of the R UPJ, suggesting UPJ obstruction, congenital or due to stricture. No stones were seen. > Lasix Renal Scan (Apr 14 2012) -- High-grade right hydronephrosis and obstruction, differential renal function was 43% on R and 56% on left  Date  Creat  eGFR 2012  1.0-1.16 November 2011 1.9-2.0 Aug 2013 2.1-2.3  09 May 2012 2.7-3.0  19-05 Jul 2012 2.4-2.6  23-08 Aug 2012 2.58  23 Sep 03 2012 2.65  22 Mar 24  2.75  21   Impression/Plan 1. CKD stage IV- pt not uremic but with marked volume overload. eGFR is 20 mL/min. Will require aggressive diuresis and will be here for a while.  Increased lasix to 120 IV every 8 hours.  Will consult [facility] while here for permanent access placement. Vein mapping ordered. [facility] Dr Abel Presto [facility] 2. Combined CHF with anasarca, last EF 25-30%- Will need high dose po lasix at discharge.  3. Hx R hydro due to UPJ obstruction Nov '13, s/p JJ stent - no sig hydro on [place] today 4. HTN- takes hydralazine, coreg, lasix at home 5. Morbid obesity 6. Anemia of CKD- Hb 9.9    Vinson Moselle  MD [facility] [phone] pgr    [phone] cell 09/04/2012, 1:00 PM

## 2012-09-04 NOTE — Consult Note (Signed)
[facility] [place]  Referred by:  Dr. Arlean Hopping  Reason for referral: New access  History of Present Illness  Tim Herrera is a 77 y.o. ([birth year]) male with CKD Stage IV who presents for evaluation for permanent access.  The patient is right hand dominant.  The patient has not had previous access procedures.  Previous central venous cannulation procedures include: none.  The patient has never had a PPM placed.   Past Medical History  Diagnosis Date  . Hypertension   . Diabetes mellitus   . CAD (coronary artery disease)     Cardiac cath May 2012 with mild plaque LAD and Circumflex and severe disease in  very small PDA branch of RCA.   Marland Kitchen Anemia   . Fatty liver disease, nonalcoholic   . Renal cyst   . Morbid obesity   . Chronic kidney disease (CKD), stage IV (severe) 01/26/2012    Followed by Dr Abel Presto at [facility].  R handed. Hx R hydro in Nov 2103 treated with R ureteral stent (UPJ obstruction) but creatinine did not improve (high 2's).     . Acute on chronic combined systolic and diastolic CHF (congestive heart failure) 04/09/2012    Last EF in Jun 2013 was 25-30%     Past Surgical History  Procedure Laterality Date  . Cystoscopy w/ ureteral stent placement  04/15/2012    Procedure: CYSTOSCOPY WITH RETROGRADE PYELOGRAM/URETERAL STENT PLACEMENT;  Surgeon: Milford Cage, MD;  Location: [facility];  Service: Urology;  Laterality: Right;  Cysto/Right Retrograde Pyelogram/Right Ureteral Stent  . Tonsillectomy      as child    History   Social History  . Marital Status: Married    Spouse Name: N/A    Number of Children: 1  . Years of Education: N/A   Occupational History  . driver     [employer]   Social History Main Topics  . Smoking status: Never Smoker   . Smokeless tobacco: Not on file  . Alcohol Use: No  . Drug Use: No  . Sexually Active: No   Other Topics Concern  . Not on file   Social History Narrative   Driver for [employer].   FamHistory Father: died 72 Mother: died late 67s, old age Brother: died 86s of alcohol related disease   No current facility-administered medications on file prior to encounter.   Current Outpatient Prescriptions on File Prior to Encounter  Medication Sig Dispense Refill  . albuterol (VENTOLIN HFA) 108 (90 BASE) MCG/ACT inhaler Inhale 2 puffs into the lungs every 4 (four) hours as needed. For shortness of breath.      Marland Kitchen aspirin EC 81 MG tablet Take 81 mg by mouth every morning.       . carvedilol (COREG) 3.125 MG tablet Take 3.125 mg by mouth 2 (two) times daily with a meal.      . darbepoetin (ARANESP) 100 MCG/0.5ML SOLN Inject 100 mcg into the skin once.       . hydrALAZINE (APRESOLINE) 25 MG tablet Take 12.5 mg by mouth 3 (three) times daily.       . isosorbide mononitrate (IMDUR) 30 MG 24 hr tablet Take 1 tablet (30 mg total) by mouth daily.  30 tablet  6    No Known Allergies  REVIEW OF SYSTEMS:  (Positives checked otherwise negative)  CARDIOVASCULAR:  []  chest pain, []  chest pressure, []  palpitations, []  shortness of breath when laying flat, [x]  shortness of breath with  exertion,  []  pain in feet when walking, []  pain in feet when laying flat, []  history of blood clot in veins (DVT), []  history of phlebitis, [x]  swelling in legs, []  varicose veins  PULMONARY:  []  productive cough, []  asthma, []  wheezing  NEUROLOGIC:  []  weakness in arms or legs, []  numbness in arms or legs, []  difficulty speaking or slurred speech, []  temporary loss of vision in one eye, []  dizziness  HEMATOLOGIC:  []  bleeding problems, []  problems with blood clotting too easily  MUSCULOSKEL:  []  joint pain, []  joint swelling  GASTROINTEST:  []  vomiting blood, []  blood in stool     GENITOURINARY:  []  burning with urination, []  blood in urine  PSYCHIATRIC:  []  history of major depression  INTEGUMENTARY:  []  rashes, []  ulcers  CONSTITUTIONAL:  []  fever, []  chills  PRE-ADM LIVING: []   Home, []  Nursing home, []  Homeless  AMB STATUS: []  Walking, []  Walking w/ Assistance, []  Wheelchair, [] Bed ridden  For [employer] Use RECENT HEART ATTACK (<6 mon): No  CAD Sx: []  No, [x]  Asx, h/o MI, []  Stable angina, []  Unstable angina  PRIOR CHF: []  No, []  Asx, [x]  Mild, []  Moderate, []  Severe  STRESS TEST: []  No, []  Normal, []  + ischemia, []  + MI, [x]  Both: 09/29/10  Physical Examination  Filed Vitals:   09/03/12 1850 09/03/12 2037 09/04/12 0500 09/04/12 1429  BP: 169/89 156/75 187/87 157/76  Pulse:   85 73  Temp: 98 F (36.7 C) 97.9 F (36.6 C) 98.2 F (36.8 C) 97.2 F (36.2 C)  TempSrc: Oral Oral Oral Oral  Resp: 18 18 20 18   Height: 5\' 10"  (1.778 m)     Weight: 350 lb (158.759 kg)  356 lb 4.2 oz (161.6 kg)   SpO2: 95% 100% 100% 100%    Body mass index is 51.12 kg/(m^2).  General: A&O x 3, WD, morbidly Obese  Head: [place]/AT  Ear/Nose/Throat: Hearing grossly intact, nares w/o erythema or drainage, oropharynx w/o Erythema/Exudate, Mallampati score: 3  Eyes: PERRLA, EOMI  Neck: Supple  Pulmonary: Sym exp, good air movt, CTAB, no rales, rhonchi, & wheezing  Cardiac: RRR, Nl S1, S2, no Murmurs, rubs or gallops  Vascular: Vessel Right Left  Radial Palpable Palpable  Ulnar Palpable Palpable  Brachial Palpable Palpable  Carotid Palpable, without bruit Palpable, without bruit  Aorta Not palpable N/A  Femoral Palpable Palpable  Popliteal Not palpable Not palpable  PT  Palpable  Palpable  DP Faintly Palpable Faintly Palpable   Gastrointestinal: soft, NTND, -G/R, - HSM, - masses, - CVAT B, obese, cannot feel AAA  Musculoskeletal: M/S 5/5 throughout , Extremities without ischemic changes , bilateral lower leg with woody edema  Neurologic: CN 2-12 intact , Pain and light touch intact in extremities , Motor exam as listed above  Psychiatric: Judgment intact, Mood & affect appropriate for pt's clinical situation  Dermatologic: See M/S exam for extremity exam, no rashes  otherwise noted  Lymph : No Cervical, Axillary, or Inguinal lymphadenopathy   Non-Invasive Vascular Imaging  Vein Mapping  (Date: 09/04/12):   R arm: acceptable vein conduits include upper arm basilic vein, marginal upper arm cephalic vein  L arm: acceptable vein conduits include  upper arm basilic vein, marginal upper arm cephalic vein  Laboratory: CBC:    Component Value Date/Time   WBC 4.2 09/04/2012 0618   RBC 3.42* 09/04/2012 0618   HGB 9.9* 09/04/2012 0618   HCT 30.1* 09/04/2012 0618   PLT 218 09/04/2012 0618  MCV 88.0 09/04/2012 0618   MCH 28.9 09/04/2012 0618   MCHC 32.9 09/04/2012 0618   RDW 17.1* 09/04/2012 0618   LYMPHSABS 0.4* 09/03/2012 1538   MONOABS 0.6 09/03/2012 1538   EOSABS 0.5 09/03/2012 1538   BASOSABS 0.0 09/03/2012 1538    BMP:    Component Value Date/Time   NA 140 09/04/2012 0618   K 4.5 09/04/2012 0618   CL 106 09/04/2012 0618   CO2 20 09/04/2012 0618   GLUCOSE 102* 09/04/2012 0618   BUN 46* 09/04/2012 0618   CREATININE 2.75* 09/04/2012 0618   CALCIUM 8.5 09/04/2012 0618   GFRNONAA 21* 09/04/2012 0618   GFRAA 25* 09/04/2012 0618    Coagulation: Lab Results  Component Value Date   INR 1.13 04/11/2012   INR 1.09 04/09/2012   INR 1.2* 10/27/2010   No results found for this basename: PTT    Radiology Dg Chest 2 View  09/03/2012  *RADIOLOGY REPORT*  Clinical Data: Lower extremity edema.  Shortness of breath. Weakness.  Cardiomyopathy.  Congestive heart failure.  CHEST - 2 VIEW  Comparison: 04/12/2012  Findings: Technical factors related to patient body habitus reduce diagnostic sensitivity and specificity.  Moderate cardiomegaly noted with pulmonary vascular indistinctness and mild interstitial accentuation.  No overt airspace edema noted.  Posterior costophrenic angles somewhat indistinct, possibly due to body habitus.  I cannot exclude small pleural effusions.  Mild lower thoracic spondylosis noted.  1.  Moderate cardiomegaly with suspected interstitial edema.    Original Report Authenticated By: Gaylyn Rong, M.D.    US Renal  09/04/2012  *RADIOLOGY REPORT*  Clinical Data: Fluid retention.  Hypertension.  RENAL / URINARY TRACT ULTRASOUND  Technique:  Complete ultrasound exam of the kidneys and urinary bladder was performed.  Comparison: CT scan from 04/12/2012.  Findings:  The right kidney measures 14.2 cm in long axis.  The left kidney measures 13.0 cm.  Both kidneys are difficult to visualize secondary to body habitus.  No evidence for a ureteral stent on either side, but this may be secondary to technical limitations. There does appear to be some fullness of the intrarenal collecting system on the right.  Urinary bladder is not visualized and may be decompressed.  There is prominent bowel gas in the pelvis and body habitus limits evaluation.  Impression:  Markedly limited study secondary to body habitus.  Mild fullness of the right intrarenal collecting system.   Original Report Authenticated By: Kennith Center, M.D.    Medical Decision Making  Sufian Ravi is a 77 y.o. male who presents with chronic kidney disease stage IV.   Based on vein mapping and examination, this patient's permanent access options include: Left or right brachiocephalic arteriovenous fistula  vs basilic vein transposition.  I would start in the non-dominant arm, left arm.  Please protect left arm from further blood draws.  I had an extensive discussion with this patient in regards to the nature of access surgery, including risk, benefits, and alternatives.    The patient is aware that the risks of access surgery include but are not limited to: bleeding, infection, steal syndrome, nerve damage, ischemic monomelic neuropathy, failure of access to mature, and possible need for additional access procedures in the future.  The patient has not made a decision in regards to hemodialysis.  Once he decides if he would like to go forward with dialysis, he needs to made a decision in  regards to permanent access.  Once he makes up his mind, we can schedule him  if he wishes.  Leonides Sake, MD [facility] [place] Office: [phone] Pager: [phone]  09/04/2012, 3:45 PM

## 2012-09-05 ENCOUNTER — Inpatient Hospital Stay (HOSPITAL_COMMUNITY): Payer: Medicare Other

## 2012-09-05 LAB — CBC
HCT: 30.5 % — ABNORMAL LOW (ref 39.0–52.0)
Hemoglobin: 9.8 g/dL — ABNORMAL LOW (ref 13.0–17.0)
RBC: 3.44 MIL/uL — ABNORMAL LOW (ref 4.22–5.81)

## 2012-09-05 LAB — RENAL FUNCTION PANEL
BUN: 48 mg/dL — ABNORMAL HIGH (ref 6–23)
CO2: 22 mEq/L (ref 19–32)
Calcium: 8.4 mg/dL (ref 8.4–10.5)
GFR calc Af Amer: 24 mL/min — ABNORMAL LOW (ref >90)
Glucose, Bld: 88 mg/dL (ref 70–99)
Phosphorus: 4 mg/dL (ref 2.3–4.6)
Potassium: 4.4 mEq/L (ref 3.5–5.1)
Sodium: 140 mEq/L (ref 135–145)

## 2012-09-05 LAB — GLUCOSE, CAPILLARY
Glucose-Capillary: 132 mg/dL — ABNORMAL HIGH (ref 70–99)
Glucose-Capillary: 143 mg/dL — ABNORMAL HIGH (ref 70–99)
Glucose-Capillary: 102 mg/dL — ABNORMAL HIGH (ref 70–99)

## 2012-09-05 MED ORDER — FUROSEMIDE 10 MG/ML IJ SOLN
160.0000 mg | Freq: Three times a day (TID) | INTRAVENOUS | Status: DC
  Administered 2012-09-05 – 2012-09-06 (×3): 160 mg via INTRAVENOUS
  Filled 2012-09-05 (×5): qty 16

## 2012-09-05 NOTE — Progress Notes (Signed)
Pharmacist Heart Failure Core Measure Documentation  Assessment: Jerrod Damiano has an EF documented as 35% on 09/04/12 by ECHO.   Rationale: Heart failure patients with left ventricular systolic dysfunction (LVSD) and an EF < 40% should be prescribed an angiotensin converting enzyme inhibitor (ACEI) or angiotensin receptor blocker (ARB) at discharge unless a contraindication is documented in the medical record.  This patient is not currently on an ACEI or ARB for HF.  This note is being placed in the record in order to provide documentation that a contraindication to the use of these agents is present for this encounter.  ACE Inhibitor or Angiotensin Receptor Blocker is contraindicated (specify all that apply)  []   ACEI allergy AND ARB allergy []   Angioedema []   Moderate or severe aortic stenosis []   Hyperkalemia []   Hypotension []   Renal artery stenosis [x]   Worsening renal function, preexisting renal disease or dysfunction   Geoffry Paradise, PharmD, BCPS Pager: [phone] 1:19 PM Pharmacy #: [id number]

## 2012-09-05 NOTE — Progress Notes (Addendum)
[facility] PROGRESS NOTE  Elick Aguilera WUJ:[id number] DOB: [birth year] DOA: 09/03/2012 PCP: [name], MD  Assessment/Plan:  1-Acute on Chronic Systolic and Diastolic HF: Patient last EF was at 30 % by ECHO 2013, he presents with worsening edema and dyspnea, anasarca. Elevated BNP at 2529.  Fulid restriction. Strict I and O.  ECHO EF at 35 %. Troponin time 3 negative. Continue with Coreg, Imdur, hydralazine. Negative 4 L. Weight 158 --- 152 (3-25)   2-CKD stage IV: Last Cr per records at 2.58. Renal US: Markedly limited study secondary to body habitus. Mild fullness of the right intrarenal collecting system. Good urine out put. Continue with Lasix 120 mg IV every 8 hours. Appreciate Dr Arlean Hopping help. Had vascular evaluation for permanent access for dialysis. If patient decide to have permanent access place during this admission will transfer patient to [facility].   3-History of Diabetes: not on medications. Monitor CBG. SSI. Check HB-A1c.  4-History right side hydronephrosis S/P ureteral stent: mild fullness of right intrarenal collecting system. I inform Dr Mena Goes of patient admission.    Code Status: Full Family Communication: Care discussed with patient. Disposition Plan: to be determine.    Consultants:  Dr Arlean Hopping  Procedures:  none  Antibiotics:  none  HPI/Subjective: No worsening SOB. Waiting to speak with his wife regarding permanent access for dialysis.   Objective: Filed Vitals:   09/04/12 0500 09/04/12 1429 09/04/12 2038 09/05/12 0445  BP: 187/87 157/76 176/79 173/84  Pulse: 85 73 77 73  Temp: 98.2 F (36.8 C) 97.2 F (36.2 C) 97.5 F (36.4 C) 98.5 F (36.9 C)  TempSrc: Oral Oral Oral Oral  Resp: 20 18 18 18   Height:      Weight: 161.6 kg (356 lb 4.2 oz)   152.5 kg (336 lb 3.2 oz)  SpO2: 100% 100% 100% 100%    Intake/Output Summary (Last 24 hours) at 09/05/12 1239 Last data filed at 09/05/12 1154  Gross per 24 hour  Intake    782 ml   Output   3169 ml  Net  -2387 ml   Filed Weights   09/03/12 1850 09/04/12 0500 09/05/12 0445  Weight: 158.759 kg (350 lb) 161.6 kg (356 lb 4.2 oz) 152.5 kg (336 lb 3.2 oz)    Exam:   General:  No distress, anasarca.   Cardiovascular: S 1, S RRR  Respiratory: Crackles bases  Abdomen: BS present, soft, NT  Musculoskeletal: plus 2 edema.   Data Reviewed: Basic Metabolic Panel:  Recent Labs Lab 09/03/12 1538 09/04/12 0618 09/05/12 0435  NA 141 140 140  K 4.8 4.5 4.4  CL 108 106 106  CO2 20 20 22   GLUCOSE 113* 102* 88  BUN 44* 46* 48*  CREATININE 2.65* 2.75* 2.81*  CALCIUM 7.9* 8.5 8.4  PHOS  --  4.1  4.3 4.0   Liver Function Tests:  Recent Labs Lab 09/03/12 1538 09/04/12 0618 09/05/12 0435  AST 16  --   --   ALT 11  --   --   ALKPHOS 319*  --   --   BILITOT 0.6  --   --   PROT 6.9  --   --   ALBUMIN 3.0* 3.1* 2.9*   No results found for this basename: LIPASE, AMYLASE,  in the last 168 hours No results found for this basename: AMMONIA,  in the last 168 hours CBC:  Recent Labs Lab 09/03/12 1538 09/04/12 0618 09/05/12 0435  WBC 4.1 4.2 5.0  NEUTROABS 2.6  --   --  HGB 9.8* 9.9* 9.8*  HCT 30.8* 30.1* 30.5*  MCV 89.5 88.0 88.7  PLT 243 218 202   Cardiac Enzymes:  Recent Labs Lab 09/03/12 1538 09/03/12 1921 09/04/12 09/04/12 0618  TROPONINI <0.30 <0.30 <0.30 <0.30   BNP (last 3 results)  Recent Labs  04/09/12 1146 07/10/12 1330 09/03/12 1538  PROBNP 14183.0* 12233.0* 25299.0*   CBG:  Recent Labs Lab 09/04/12 1145 09/04/12 1657 09/04/12 2126 09/05/12 0750 09/05/12 1211  GLUCAP 100* 139* 127* 132* 303*    No results found for this or any previous visit (from the past 240 hour(s)).   Studies: Dg Chest 2 View  09/03/2012  *RADIOLOGY REPORT*  Clinical Data: Lower extremity edema.  Shortness of breath. Weakness.  Cardiomyopathy.  Congestive heart failure.  CHEST - 2 VIEW  Comparison: 04/12/2012  Findings: Technical factors  related to patient body habitus reduce diagnostic sensitivity and specificity.  Moderate cardiomegaly noted with pulmonary vascular indistinctness and mild interstitial accentuation.  No overt airspace edema noted.  Posterior costophrenic angles somewhat indistinct, possibly due to body habitus.  I cannot exclude small pleural effusions.  Mild lower thoracic spondylosis noted.  1.  Moderate cardiomegaly with suspected interstitial edema.   Original Report Authenticated By: Gaylyn Rong, M.D.    US Renal  09/04/2012  *RADIOLOGY REPORT*  Clinical Data: Fluid retention.  Hypertension.  RENAL / URINARY TRACT ULTRASOUND  Technique:  Complete ultrasound exam of the kidneys and urinary bladder was performed.  Comparison: CT scan from 04/12/2012.  Findings:  The right kidney measures 14.2 cm in long axis.  The left kidney measures 13.0 cm.  Both kidneys are difficult to visualize secondary to body habitus.  No evidence for a ureteral stent on either side, but this may be secondary to technical limitations. There does appear to be some fullness of the intrarenal collecting system on the right.  Urinary bladder is not visualized and may be decompressed.  There is prominent bowel gas in the pelvis and body habitus limits evaluation.  Impression:  Markedly limited study secondary to body habitus.  Mild fullness of the right intrarenal collecting system.   Original Report Authenticated By: Kennith Center, M.D.     Scheduled Meds: . aspirin EC  81 mg Oral q morning - 10a  . carvedilol  3.125 mg Oral BID WC  . docusate sodium  100 mg Oral BID  . furosemide  160 mg Intravenous Q8H  . heparin  5,000 Units Subcutaneous Q8H  . hydrALAZINE  12.5 mg Oral TID  . insulin aspart  0-9 Units Subcutaneous TID WC  . isosorbide mononitrate  30 mg Oral Daily  . sodium chloride  3 mL Intravenous Q12H  . sodium chloride  3 mL Intravenous Q12H   Continuous Infusions:   Principal Problem:   Acute on chronic combined systolic  and diastolic CHF (congestive heart failure) Active Problems:   HTN (hypertension)   Chronic kidney disease (CKD), stage IV (severe)   DM (diabetes mellitus)   Anemia   Obesity, Class III, BMI 40-49.9 (morbid obesity)    Time spent: 25 minutes.    Cleon Thoma  [facility] Pager [phone]. If 7PM-7AM, please contact night-coverage at [email or web address], password [facility] 09/05/2012, 12:39 PM  LOS: 2 days

## 2012-09-05 NOTE — Clinical Documentation Improvement (Signed)
Anemia Documentation Clarification Query  THIS DOCUMENT IS NOT A PERMANENT PART OF THE MEDICAL RECORD  RESPOND TO THE THIS QUERY, FOLLOW THE INSTRUCTIONS BELOW:  1. If needed, update documentation for the patient's encounter via the notes activity.  2. Access this query again and click edit on the In [employer].  3. After updating, or not, click F2 to complete all highlighted (required) fields concerning your review. Select "additional documentation in the medical record" OR "no additional documentation provided".  4. Click Sign note button.  5. The deficiency will fall out of your In Basket *Please let us know if you are not able to complete this workflow by phone or e-mail (listed below).          09/05/12  Dear Dr. [facility]  In an effort to better capture your patient's severity of illness, reflect appropriate length of stay and utilization of resources, a review of the patient medical record has revealed the following indicators.    Based on your clinical judgment, please clarify and document in a progress note and/or discharge summary the clinical condition associated with the following supporting information:  In responding to this query please exercise your independent judgment.  The fact that a query is asked, does not imply that any particular answer is desired or expected.  09/05/12 per Progress Note "Anemia" noted. For Accurate Dx specificity & severity can noted "Anemia" be further specified with type for cond being mon'd, eval'd & tx'd. Thank you  Possible Clinical Conditions?  " Iron deficiency anemia " Pernicious anemia " Chronic blood loss anemia " Anemia due to chronic disease " Anemia due to malignancy " Aplastic anemia " Acute Blood Loss Anemia " Precipitous drop in Hematocrit " Sickle cell anemia  " Other Condition____________ " Cannot Clinically Determine   Supporting Information: Signs and Symptoms: Per H&P pt receiving Aranesp  subcutaneous   Diagnostics: 09/03/12:           09/04/12            09/05/12 Hgb  9.8      9.9    9.8                     Hct  30.8    30.1               30.5  Treatments: see above note  Transfusion:  You may use possible, probable, or suspect with inpatient documentation. Possible, probable, suspected diagnoses MUST be documented at the time of discharge.  Reviewed: additional documentation in the MEDICAL RECORD [id number] response per Nephro MD.orm  Thank You,  Toribio Harbour, RN, BSN, CCDS Certified Clinical Documentation Specialist Pager: [phone]  Health Information Management [place]

## 2012-09-05 NOTE — Consult Note (Signed)
Urology Consult Dx: right UPJ obstruction, right hydronephrosis Requested by: Dr. [name]  History of Present Illness:  77 yo admitted for CHF exacerbation with anasarca, last EF 25-30%. His GFR was been at baseline. He was admitted for similar in Nov 2013 and found to have Right UPJ obstruction on CT and Renal scan. I placed a right ureteral stent.   I performed bladder bx and right ureteroscopy Jan 2014 with stent change. No stricture or tumor was found on ureteroscopy. He will be due for stent change around May 2014 unless the current stent is not functioning.   His Cr was at baseline on admission, his UOP has been good. Renal U/S showed some fullness on the right. No stent visualized. KUB shows right stent in good position.  He is voiding without difficulty and not having any bother from the stent.      Past Medical History  Diagnosis Date  . Hypertension   . Diabetes mellitus   . CAD (coronary artery disease)     Cardiac cath May 2012 with mild plaque LAD and Circumflex and severe disease in  very small PDA branch of RCA.   Marland Kitchen Anemia   . Fatty liver disease, nonalcoholic   . Renal cyst   . Morbid obesity   . Chronic kidney disease (CKD), stage IV (severe) 01/26/2012    Followed by Dr Abel Presto at [facility].  R handed. Hx R hydro in Nov 2103 treated with R ureteral stent (UPJ obstruction) but creatinine did not improve (high 2's).     . Acute on chronic combined systolic and diastolic CHF (congestive heart failure) 04/09/2012    Last EF in Jun 2013 was 25-30%    Past Surgical History  Procedure Laterality Date  . Cystoscopy w/ ureteral stent placement  04/15/2012    Procedure: CYSTOSCOPY WITH RETROGRADE PYELOGRAM/URETERAL STENT PLACEMENT;  Surgeon: Milford Cage, MD;  Location: [facility];  Service: Urology;  Laterality: Right;  Cysto/Right Retrograde Pyelogram/Right Ureteral Stent  . Tonsillectomy      as child    Home Medications:  Prescriptions prior to admission   Medication Sig Dispense Refill  . albuterol (VENTOLIN HFA) 108 (90 BASE) MCG/ACT inhaler Inhale 2 puffs into the lungs every 4 (four) hours as needed. For shortness of breath.      Marland Kitchen aspirin EC 81 MG tablet Take 81 mg by mouth every morning.       . carvedilol (COREG) 3.125 MG tablet Take 3.125 mg by mouth 2 (two) times daily with a meal.      . darbepoetin (ARANESP) 100 MCG/0.5ML SOLN Inject 100 mcg into the skin once.       . furosemide (LASIX) 80 MG tablet Take 120 mg by mouth 2 (two) times daily.      . hydrALAZINE (APRESOLINE) 25 MG tablet Take 12.5 mg by mouth 3 (three) times daily.       . isosorbide mononitrate (IMDUR) 30 MG 24 hr tablet Take 1 tablet (30 mg total) by mouth daily.  30 tablet  6  . atorvastatin (LIPITOR) 20 MG tablet Take 20 mg by mouth daily.      . calcitRIOL (ROCALTROL) 0.25 MCG capsule Take 0.25 mcg by mouth daily.      . ferrous sulfate 325 (65 FE) MG tablet Take 325 mg by mouth daily with breakfast.      . spironolactone (ALDACTONE) 25 MG tablet Take 25 mg by mouth daily.       Allergies: No Known Allergies  History reviewed. No pertinent family history. Social History:  reports that he has never smoked. He does not have any smokeless tobacco history on file. He reports that he does not drink alcohol or use illicit drugs.  ROS: A complete review of systems was performed.  All systems are negative except for pertinent findings as noted. @ROS @   Physical Exam:  Vital signs in last 24 hours: Temp:  [97.2 F (36.2 C)-98.5 F (36.9 C)] 98.5 F (36.9 C) (03/25 0445) Pulse Rate:  [73-77] 73 (03/25 0445) Resp:  [18] 18 (03/25 0445) BP: (157-176)/(76-84) 173/84 mmHg (03/25 0445) SpO2:  [100 %] 100 % (03/25 0445) Weight:  [152.5 kg (336 lb 3.2 oz)] 152.5 kg (336 lb 3.2 oz) (03/25 0445)   Intake/Output Summary (Last 24 hours) at 09/05/12 1336 Last data filed at 09/05/12 1154  Gross per 24 hour  Intake    542 ml  Output   2995 ml  Net  -2453 ml      General:  Alert and oriented, No acute distress HEENT: Normocephalic, atraumatic Neck: No JVD or lymphadenopathy Cardiovascular: Regular rate and rhythm Lungs: Regular rate and effort Abdomen: Soft, nontender, nondistended, no abdominal masses Back: No CVA tenderness Extremities: No edema Neurologic: Grossly intact  Laboratory Data:  Results for orders placed during the hospital encounter of 09/03/12 (from the past 24 hour(s))  GLUCOSE, CAPILLARY     Status: Abnormal   Collection Time    09/04/12  4:57 PM      Result Value Range   Glucose-Capillary 139 (*) 70 - 99 mg/dL  GLUCOSE, CAPILLARY     Status: Abnormal   Collection Time    09/04/12  9:26 PM      Result Value Range   Glucose-Capillary 127 (*) 70 - 99 mg/dL  RENAL FUNCTION PANEL     Status: Abnormal   Collection Time    09/05/12  4:35 AM      Result Value Range   Sodium 140  135 - 145 mEq/L   Potassium 4.4  3.5 - 5.1 mEq/L   Chloride 106  96 - 112 mEq/L   CO2 22  19 - 32 mEq/L   Glucose, Bld 88  70 - 99 mg/dL   BUN 48 (*) 6 - 23 mg/dL   Creatinine, Ser 1.61 (*) 0.50 - 1.35 mg/dL   Calcium 8.4  8.4 - 09.6 mg/dL   Phosphorus 4.0  2.3 - 4.6 mg/dL   Albumin 2.9 (*) 3.5 - 5.2 g/dL   GFR calc non Af Amer 21 (*) >90 mL/min   GFR calc Af Amer 24 (*) >90 mL/min  CBC     Status: Abnormal   Collection Time    09/05/12  4:35 AM      Result Value Range   WBC 5.0  4.0 - 10.5 K/uL   RBC 3.44 (*) 4.22 - 5.81 MIL/uL   Hemoglobin 9.8 (*) 13.0 - 17.0 g/dL   HCT 04.5 (*) 40.9 - 81.1 %   MCV 88.7  78.0 - 100.0 fL   MCH 28.5  26.0 - 34.0 pg   MCHC 32.1  30.0 - 36.0 g/dL   RDW 91.4 (*) 78.2 - 95.6 %   Platelets 202  150 - 400 K/uL  GLUCOSE, CAPILLARY     Status: Abnormal   Collection Time    09/05/12  7:50 AM      Result Value Range   Glucose-Capillary 132 (*) 70 - 99 mg/dL  GLUCOSE, CAPILLARY  Status: Abnormal   Collection Time    09/05/12 12:11 PM      Result Value Range   Glucose-Capillary 303 (*) 70 - 99  mg/dL   No results found for this or any previous visit (from the past 240 hour(s)). Creatinine:  Recent Labs  09/03/12 1538 09/04/12 0618 09/05/12 0435  CREATININE 2.65* 2.75* 2.81*    Impression/Assessment:  Impression/Plan  1. CKD stage IV - vascular surgery has seen for access. If/when patient goes on dialysis I would simply remove the ureteral stent. He is not a good candidate for a pyeloplasty.  Until then the stent will need to be changed q 4-6 months. I discussed all this with the patient and he knows to follow up for stent change.  2. Hx R hydro due to right UPJ obstruction - stent changed Jan 2014 - no significant hydro on [place], stent in good position on KUB.     Antony Haste 09/05/2012

## 2012-09-05 NOTE — Progress Notes (Signed)
Subjective: Feeling "much better", net negative 1700 cc yest  Objective Vital signs in last 24 hours: Filed Vitals:   09/04/12 1429 09/04/12 2038 09/05/12 0445 09/05/12 1449  BP: 157/76 176/79 173/84 152/76  Pulse: 73 77 73 81  Temp: 97.2 F (36.2 C) 97.5 F (36.4 C) 98.5 F (36.9 C) 98.5 F (36.9 C)  TempSrc: Oral Oral Oral Oral  Resp: 18 18 18 18   Height:      Weight:   152.5 kg (336 lb 3.2 oz)   SpO2: 100% 100% 100% 100%   Weight change: -6.259 kg (-13 lb 12.8 oz)  Intake/Output Summary (Last 24 hours) at 09/05/12 1456 Last data filed at 09/05/12 1300  Gross per 24 hour  Intake    542 ml  Output   3325 ml  Net  -2783 ml   Labs: Basic Metabolic Panel:  Recent Labs Lab 09/03/12 1538 09/04/12 0618 09/05/12 0435  NA 141 140 140  K 4.8 4.5 4.4  CL 108 106 106  CO2 20 20 22   GLUCOSE 113* 102* 88  BUN 44* 46* 48*  CREATININE 2.65* 2.75* 2.81*  CALCIUM 7.9* 8.5 8.4  PHOS  --  4.1  4.3 4.0   Liver Function Tests:  Recent Labs Lab 09/03/12 1538 09/04/12 0618 09/05/12 0435  AST 16  --   --   ALT 11  --   --   ALKPHOS 319*  --   --   BILITOT 0.6  --   --   PROT 6.9  --   --   ALBUMIN 3.0* 3.1* 2.9*   No results found for this basename: LIPASE, AMYLASE,  in the last 168 hours No results found for this basename: AMMONIA,  in the last 168 hours CBC:  Recent Labs Lab 09/03/12 1538 09/04/12 0618 09/05/12 0435  WBC 4.1 4.2 5.0  NEUTROABS 2.6  --   --   HGB 9.8* 9.9* 9.8*  HCT 30.8* 30.1* 30.5*  MCV 89.5 88.0 88.7  PLT 243 218 202   PT/INR: @LABRCNTIP (inr:5)   Scheduled Meds ) . aspirin EC  81 mg Oral q morning - 10a  . carvedilol  3.125 mg Oral BID WC  . docusate sodium  100 mg Oral BID  . furosemide  160 mg Intravenous Q8H  . heparin  5,000 Units Subcutaneous Q8H  . hydrALAZINE  12.5 mg Oral TID  . insulin aspart  0-9 Units Subcutaneous TID WC  . isosorbide mononitrate  30 mg Oral Daily  . sodium chloride  3 mL Intravenous Q12H  . sodium  chloride  3 mL Intravenous Q12H    Physical Exam:  Blood pressure 152/76, pulse 81, temperature 98.5 F (36.9 C), temperature source Oral, resp. rate 18, height 5\' 10"  (1.778 m), weight 152.5 kg (336 lb 3.2 oz), SpO2 100.00%.  Gen: obese AAM in no distress at 60 deg in the bed  Skin: no rash, cyanosis  HEENT: EOMI, sclera anicteric, throat clear  Neck: + JVD, no LAN, no bruits  Chest: faint basilar rales R base, L is clear  CV: regular, no rub or gallop, quiet precordium  Abdomen: soft, obese, no ascites, no HSM, nontender  Ext: 3+ diffuse bilateral LE edema extending to thight, scrotum/penis and abdominal wall in dependent areas, no joint effusion or deformity, no gangrene or ulceration  Neuro: alert, Ox3, no focal deficit, no asterixis   > UA- 100 prot, 3-6 wbc, TNTC rbc 's, hyaline casts  > CXR- CM, ? IS edema, no frank  edema  > Renal US- 13.0/14.2 cm kidneys, poor study due to body habitus, no ureteral stents visualized, slight fullness of collecting system on right side, bladder not visualized  > CT abdomen 04/12/2012 -- marked R sided hydronephrosis to the level of the R UPJ > Lasix Renal Scan (Apr 14 2012) -- High-grade right hydronephrosis and obstruction, differential fxn 43% on R and 56% on left   Date   Creat   eGFR  2012   1.0-1.16 November 2011  1.9-2.0  Aug 2013  2.1-2.3  09 May 2012  2.7-3.0  19-05 Jul 2012  2.4-2.6  23-08 Aug 2012  2.58   23  Sep 03 2012  2.65   22  Mar 24  2.75   21   Impression/Rec 1. CKD stage IV- marked volume overload. Increase lasix max 160mg  every 8 hrs. Diuresing. Have consulted [facility] while here for permanent access placement. Vein mapping ordered. Sees Dr Abel Presto at [facility] 2. Combined CHF with anasarca, last EF 25-30%- Will need high dose po lasix at discharge.  3. Hx R hydro due to UPJ obstruction Nov '13, s/p JJ stent - no sig hydro on [place] 4. HTN- takes hydralazine, coreg, lasix at home 5. Morbid obesity 6. Anemia of CKD- Hb 9.9   Tim Moselle  MD [phone] pgr    [phone] cell 09/05/2012, 2:56 PM

## 2012-09-06 DIAGNOSIS — D649 Anemia, unspecified: Secondary | ICD-10-CM

## 2012-09-06 LAB — RENAL FUNCTION PANEL
Albumin: 2.7 g/dL — ABNORMAL LOW (ref 3.5–5.2)
Chloride: 103 mEq/L (ref 96–112)
GFR calc Af Amer: 24 mL/min — ABNORMAL LOW (ref >90)
GFR calc non Af Amer: 21 mL/min — ABNORMAL LOW (ref >90)
Phosphorus: 3.9 mg/dL (ref 2.3–4.6)
Potassium: 4.3 mEq/L (ref 3.5–5.1)
Sodium: 138 mEq/L (ref 135–145)

## 2012-09-06 LAB — GLUCOSE, CAPILLARY
Glucose-Capillary: 139 mg/dL — ABNORMAL HIGH (ref 70–99)
Glucose-Capillary: 142 mg/dL — ABNORMAL HIGH (ref 70–99)

## 2012-09-06 MED ORDER — FUROSEMIDE 80 MG PO TABS: 160.0000 mg | ORAL_TABLET | Freq: Two times a day (BID) | ORAL | Status: AC

## 2012-09-06 NOTE — Progress Notes (Signed)
Clinical Social Work Department BRIEF PSYCHOSOCIAL ASSESSMENT 09/06/2012  Patient:  Tim Herrera, Tim Herrera     Account Number:  [id number]     Admit date:  09/03/2012  Clinical Social Worker:  Orpah Greek  Date/Time:  09/06/2012 03:56 PM  Referred by:  Physician  Date Referred:  09/06/2012 Referred for  SNF Placement   Other Referral:   Interview type:  Patient Other interview type:   and wife at bedside    PSYCHOSOCIAL DATA Living Status:  WIFE Admitted from facility:   Level of care:   Primary support name:  Gottlieb Zuercher (wife) h#: [phone] c#: [phone] Primary support relationship to patient:  SPOUSE Degree of support available:   good    CURRENT CONCERNS Current Concerns  Post-Acute Placement   Other Concerns:    SOCIAL WORK ASSESSMENT / PLAN CSW received consult from Charge RN that patient is set to be discharged home with wife today, but when wife came to visit patient realized that she can't take him home in his current weakened condition. CSW met with patient & wife, they requested [facility].   Assessment/plan status:  Information/Referral to [facility] Other assessment/ plan:   Information/referral to community resources:   CSW completed FL2 and faxed information out to [facility] - spoke with Joellyn Haff @ [facility], who confirmed that they would be able to take patient.    PATIENT'S/FAMILY'S RESPONSE TO PLAN OF CARE: Patient & wife were relieved to hear that [facility] would be able to take him today.        Unice Bailey, LCSW [facility] Clinical Social Worker cell #: [phone]

## 2012-09-06 NOTE — Plan of Care (Signed)
Problem: Phase II Progression Outcomes Goal: Walk in hall or up in chair TID Outcome: Not Progressing Unable to tolerate standing due to bilateral knee pain, pt also reports edematous lower extremities limiting mobility status

## 2012-09-06 NOTE — Discharge Summary (Signed)
[facility]                                                                                   Tim Herrera, is a 77 y.o. male  DOB [birth year]  MRN [id number].  Admission date:  09/03/2012  Discharge Date:  09/06/2012  Primary MD  [name], MD  Admitting Physician  Alba Cory, MD  Admission Diagnosis  CHF exacerbation [428.0]  Discharge Diagnosis     Principal Problem:   Acute on chronic combined systolic and diastolic CHF (congestive heart failure) Active Problems:   HTN (hypertension)   Chronic kidney disease (CKD), stage IV (severe)   DM (diabetes mellitus)   Anemia   Obesity, Class III, BMI 40-49.9 (morbid obesity)     Past Medical History  Diagnosis Date  . Hypertension   . Diabetes mellitus   . CAD (coronary artery disease)     Cardiac cath May 2012 with mild plaque LAD and Circumflex and severe disease in  very small PDA branch of RCA.   Marland Kitchen Anemia   . Fatty liver disease, nonalcoholic   . Renal cyst   . Morbid obesity   . Chronic kidney disease (CKD), stage IV (severe) 01/26/2012    Followed by Dr Abel Presto at [facility].  R handed. Hx R hydro in Nov 2103 treated with R ureteral stent (UPJ obstruction) but creatinine did not improve (high 2's).     . Acute on chronic combined systolic and diastolic CHF (congestive heart failure) 04/09/2012    Last EF in Jun 2013 was 25-30%     Past Surgical History  Procedure Laterality Date  . Cystoscopy w/ ureteral stent placement  04/15/2012    Procedure: CYSTOSCOPY WITH RETROGRADE PYELOGRAM/URETERAL STENT PLACEMENT;  Surgeon: Milford Cage, MD;  Location: [facility];  Service: Urology;  Laterality: Right;  Cysto/Right Retrograde Pyelogram/Right Ureteral Stent  . Tonsillectomy      as child     Recommendations for primary care physician for things to follow:   Follow weight, edema and BMP closely   Discharge Diagnoses:   Principal Problem:   Acute on chronic combined systolic and diastolic  CHF (congestive heart failure) Active Problems:   HTN (hypertension)   Chronic kidney disease (CKD), stage IV (severe)   DM (diabetes mellitus)   Anemia   Obesity, Class III, BMI 40-49.9 (morbid obesity)    Discharge Condition: stable   Diet recommendation: See Discharge Instructions below   Consults Renal,Vascular    History of present illness and  Hospital Course:     Kindly see H&P for history of present illness and admission details, please review complete Labs, Consult reports and Test reports for all details in brief Tim Herrera, is a 77 y.o. male, patient was admitted for shortness of breath and edema due to combination of acute on chronic combined systolic and diastolic heart failure with EF 30% by echo in 2013, in combination with chronic kidney failure stage IV. Patient was seen here by nephrology, diurese with IV Lasix 120 mg every 8 hours, he is now relatively symptom-free although he still has some edema, I discussed his case with nephrologist Dr. Vida Roller today ,  plan is to discharge him on a higher dose Lasix i.e. 160 mg by mouth twice a day, he is been advised on fluid restriction, he will follow closely with his primary care physician and his primary nephrologist will request him to please monitor his weight, edema and BMP closely. His home dose coreg, hydralazine, aspirin and statin will be continued. No ACE/ARB at this time due to ongoing renal failure.      For his chronic kidney disease stage IV he will continue to follow with his nephrologist closely, he underwent vein mapping, he was also seen by vascular surgery, she will follow with vascular surgery as outpatient, patient has still not decided whether he to pursue hemodialysis.    For his type 2 diabetes mellitus which has been in control, and that control will be continued his A1c was 6.1.     He also has history of right-sided hydronephrosis he status post ureteral stent placement by Dr. Mena Goes, he will  continue to follow with him as outpatient, no acute shoes this admission, Dr. Mena Goes saw the patient here too.    For his hypertension he will continue his home medications unchanged.     Today   Subjective:   Tim Herrera today has no headache,no chest abdominal pain,no new weakness tingling or numbness, feels much better wants to go home today.    Objective:   Blood pressure 167/67, pulse 81, temperature 98.5 F (36.9 C), temperature source Oral, resp. rate 19, height 5\' 10"  (1.778 m), weight 153.8 kg (339 lb 1.1 oz), SpO2 98.00%.   Intake/Output Summary (Last 24 hours) at 09/06/12 0929 Last data filed at 09/06/12 0700  Gross per 24 hour  Intake    372 ml  Output   3040 ml  Net  -2668 ml    Exam Awake Alert, Oriented *3, No new F.N deficits, Normal affect [place].AT,PERRAL Supple Neck,No JVD, No cervical lymphadenopathy appriciated.  Symmetrical Chest wall movement, Good air movement bilaterally, CTAB RRR,No Gallops,Rubs or new Murmurs, No Parasternal Heave +ve B.Sounds, Abd Soft, Non tender, No organomegaly appriciated, No rebound -guarding or rigidity. No Cyanosis, Clubbing, has 1+ edema, No new Rash or bruise  Data Review   Major procedures and Radiology Reports - PLEASE review detailed and final reports for all details in brief -       Dg Chest 2 View  09/03/2012  *RADIOLOGY REPORT*  Clinical Data: Lower extremity edema.  Shortness of breath. Weakness.  Cardiomyopathy.  Congestive heart failure.  CHEST - 2 VIEW  Comparison: 04/12/2012  Findings: Technical factors related to patient body habitus reduce diagnostic sensitivity and specificity.  Moderate cardiomegaly noted with pulmonary vascular indistinctness and mild interstitial accentuation.  No overt airspace edema noted.  Posterior costophrenic angles somewhat indistinct, possibly due to body habitus.  I cannot exclude small pleural effusions.  Mild lower thoracic spondylosis noted.  1.  Moderate cardiomegaly with  suspected interstitial edema.   Original Report Authenticated By: Gaylyn Rong, M.D.    Dg Abd 1 View  09/05/2012  *RADIOLOGY REPORT*  Clinical Data: Hydronephrosis with ureteral stent on the right  ABDOMEN - 1 VIEW  Comparison: None.  Findings: There is a double-J stent extending from the level of L2 on the right to the level of the urinary bladder.  Bowel gas pattern is within normal limits.  Calcifications in the pelvis most likely represent phleboliths.  IMPRESSION: Ureteral stent on the right.  Bowel gas pattern unremarkable.   Original Report Authenticated By: Bretta Bang,  M.D.    US Renal  09/04/2012  *RADIOLOGY REPORT*  Clinical Data: Fluid retention.  Hypertension.  RENAL / URINARY TRACT ULTRASOUND  Technique:  Complete ultrasound exam of the kidneys and urinary bladder was performed.  Comparison: CT scan from 04/12/2012.  Findings:  The right kidney measures 14.2 cm in long axis.  The left kidney measures 13.0 cm.  Both kidneys are difficult to visualize secondary to body habitus.  No evidence for a ureteral stent on either side, but this may be secondary to technical limitations. There does appear to be some fullness of the intrarenal collecting system on the right.  Urinary bladder is not visualized and may be decompressed.  There is prominent bowel gas in the pelvis and body habitus limits evaluation.  Impression:  Markedly limited study secondary to body habitus.  Mild fullness of the right intrarenal collecting system.   Original Report Authenticated By: Kennith Center, M.D.     Micro Results      No results found for this or any previous visit (from the past 240 hour(s)).   CBC w Diff: Lab Results  Component Value Date   WBC 5.0 09/05/2012   HGB 9.8* 09/05/2012   HCT 30.5* 09/05/2012   PLT 202 09/05/2012   LYMPHOPCT 9* 09/03/2012   MONOPCT 15* 09/03/2012   EOSPCT 12* 09/03/2012   BASOPCT 1 09/03/2012    CMP: Lab Results  Component Value Date   NA 138 09/06/2012   K 4.3  09/06/2012   CL 103 09/06/2012   CO2 23 09/06/2012   BUN 49* 09/06/2012   CREATININE 2.76* 09/06/2012   PROT 6.9 09/03/2012   ALBUMIN 2.7* 09/06/2012   BILITOT 0.6 09/03/2012   ALKPHOS 319* 09/03/2012   AST 16 09/03/2012   ALT 11 09/03/2012  .   Discharge Instructions     Follow with Primary MD [name], MD in 3 days   Get CBC, CMP, checked 3 days by Primary MD and again as instructed by your Primary MD. Get a 2 view Chest X ray done next visit.  Get Medicines reviewed and adjusted.  Please request your Prim.MD to go over all Hospital Tests and Procedure/Radiological results at the follow up, please get all Hospital records sent to your Prim MD by signing hospital release before you go home.  Activity: As tolerated with Full fall precautions use walker/cane & assistance as needed   Diet:  Heart healthy - low carbohydrate,  Fluid restriction 1.5 lit/day, Aspiration precautions.  For Heart failure patients - Check your Weight same time everyday, if you gain over 2 pounds, or you develop in leg swelling, experience more shortness of breath or chest pain, call your Primary MD immediately. Follow Cardiac Low Salt Diet and 1.5 lit/day fluid restriction.  Disposition Home   If you experience worsening of your admission symptoms, develop shortness of breath, life threatening emergency, suicidal or homicidal thoughts you must seek medical attention immediately by calling 911 or calling your MD immediately  if symptoms less severe.  You Must read complete instructions/literature along with all the possible adverse reactions/side effects for all the Medicines you take and that have been prescribed to you. Take any new Medicines after you have completely understood and accpet all the possible adverse reactions/side effects.   Do not drive and provide baby sitting services if your were admitted for syncope or siezures until you have seen by Primary MD or a Neurologist and advised to do so  again.  Do not drive  when taking Pain medications.    Do not take more than prescribed Pain, Sleep and Anxiety Medications  Special Instructions: If you have smoked or chewed Tobacco  in the last 2 yrs please stop smoking, stop any regular Alcohol  and or any Recreational drug use.  Wear Seat belts while driving.     Follow-up Information   Follow up with Antony Haste, MD. (6-8 weeks)    Contact information:   [address] [place] [address] [phone]       Follow up with [name], MD. Schedule an appointment as soon as possible for a visit in 3 days.   Contact information:   [address] [facility] [place] [address] [phone]       Follow up with COLADONATO,JOSEPH A, MD. Schedule an appointment as soon as possible for a visit in 1 week.   Contact information:   [address] [facility] [place] [address] [phone]       Follow up with Nilda Simmer, MD. Schedule an appointment as soon as possible for a visit in 2 weeks.   Contact information:   [address] [place] [address] [phone]         Discharge Medications     Medication List    TAKE these medications       aspirin EC 81 MG tablet  Take 81 mg by mouth every morning.     atorvastatin 20 MG tablet  Commonly known as:  LIPITOR  Take 20 mg by mouth daily.     calcitRIOL 0.25 MCG capsule  Commonly known as:  ROCALTROL  Take 0.25 mcg by mouth daily.     carvedilol 3.125 MG tablet  Commonly known as:  COREG  Take 3.125 mg by mouth 2 (two) times daily with a meal.     darbepoetin 100 MCG/0.5ML Soln  Commonly known as:  ARANESP  Inject 100 mcg into the skin once.     ferrous sulfate 325 (65 FE) MG tablet  Take 325 mg by mouth daily with breakfast.     furosemide 80 MG tablet  Commonly known as:  LASIX  Take 2 tablets (160 mg total) by mouth 2 (two) times daily.      hydrALAZINE 25 MG tablet  Commonly known as:  APRESOLINE  Take 12.5 mg by mouth 3 (three) times daily.     isosorbide mononitrate 30 MG 24 hr tablet  Commonly known as:  IMDUR  Take 1 tablet (30 mg total) by mouth daily.     spironolactone 25 MG tablet  Commonly known as:  ALDACTONE  Take 25 mg by mouth daily.     VENTOLIN HFA 108 (90 BASE) MCG/ACT inhaler  Generic drug:  albuterol  Inhale 2 puffs into the lungs every 4 (four) hours as needed. For shortness of breath.           Total Time in preparing paper work, data evaluation and todays exam - 35 minutes  Leroy Sea M.D on 09/06/2012 at 9:29 AM  [facility] Office  [phone]

## 2012-09-06 NOTE — Progress Notes (Signed)
Talked to patient about DCP/ home health care services, patient has had [facility] in the past and request their services again; Tim Herrera with [facility] called for arrangements; B Ave Filter RN,BSN,MHA

## 2012-09-06 NOTE — Progress Notes (Signed)
Patient is set to discharge to [facility] today. Patient & wife, Jeannetta Nap at bedside and aware. Discharge packet in [place]. [facility] to be called once facility says bed is available - anticipating around 5-6:00p today.   Clinical Social Work Department CLINICAL SOCIAL WORK PLACEMENT NOTE 09/06/2012  Patient:  Tim Herrera, Tim Herrera  Account Number:  [id number] Admit date:  09/03/2012  Clinical Social Worker:  Orpah Greek  Date/time:  09/06/2012 04:04 PM  Clinical Social Work is seeking post-discharge placement for this patient at the following level of care:   SKILLED NURSING   (*CSW will update this form in Epic as items are completed)   09/06/2012  Patient/family provided with [facility] Department of Clinical Social Work's list of facilities offering this level of care within the geographic area requested by the patient (or if unable, by the patient's family).  09/06/2012  Patient/family informed of their freedom to choose among providers that offer the needed level of care, that participate in Medicare, Medicaid or managed care program needed by the patient, have an available bed and are willing to accept the patient.  09/06/2012  Patient/family informed of [facility]' ownership interest in [facility], as well as of the fact that they are under no obligation to receive care at this facility.  PASARR submitted to EDS on 09/06/2012 PASARR number received from EDS on 09/06/2012  FL2 transmitted to all facilities in geographic area requested by pt/family on  09/06/2012 FL2 transmitted to all facilities within larger geographic area on   Patient informed that his/her managed care company has contracts with or will negotiate with  certain facilities, including the following:     Patient/family informed of bed offers received:  09/06/2012 Patient chooses bed at [facility] Physician recommends and patient chooses bed at    Patient to be transferred to  [facility] on  09/06/2012 Patient to be transferred to facility by [facility]  The following physician request were entered in Epic:   Additional Comments:  Unice Bailey, LCSW [facility] Clinical Social Worker cell #: [phone]

## 2012-09-06 NOTE — Progress Notes (Signed)
Report given to nurse at [facility] Stanford Breed RN 09-06-2012 17:31pm

## 2012-09-06 NOTE — Evaluation (Signed)
Physical Therapy Evaluation Patient Details Name: Tim Herrera MRN: [id number] DOB: 31-Jul-1935 Today's Date: 09/06/2012 Time: 1308-6578 PT Time Calculation (min): 10 min  PT Assessment / Plan / Recommendation Clinical Impression  Pt is a 77 yo morbidly obese male admitted for CHF.  Pt would benefit from acute PT services in order to improve independence with transfers and ambulation to prepare for d/c to SNF.  Pt unable to tolerate standing today due to bilateral knee pain and reports decreased motion due to increased edema in lower extremities.  Pt preferred as much knee extension as possible due to increased pain with knee flexion.  Pt requiring +2 assist and unable to ambulate so recommmend d/c to SNF.    PT Assessment  Patient needs continued PT services    Follow Up Recommendations  SNF    Does the patient have the potential to tolerate intense rehabilitation      Barriers to Discharge        Equipment Recommendations  Rolling walker with 5" wheels (bari RW)    Recommendations for Other Services     Frequency Min 3X/week    Precautions / Restrictions Precautions Precautions: Fall Precaution Comments: morbidly obese   Pertinent Vitals/Pain Bilateral knee pain, unable to tolerate standing, repositioned to comfort in recliner, pt declined pain meds     Mobility  Bed Mobility Bed Mobility: Not assessed Transfers Transfers: Sit to Stand;Stand to Sit Sit to Stand: 1: +2 Total assist Sit to Stand: Patient Percentage: 30% Stand to Sit: 1: +2 Total assist Stand to Sit: Patient Percentage: 30% Details for Transfer Assistance: pt given verbal cues to lean forward and place feet on floor to come to edge of chair, pt wished to keep knees straight due to pain with increased knee flexion, cued pt for forward lean, UEs assist by armrests and slide feet back towards chair to position to stand, attempted standing however pt crying out in pain and unable to complete full extension,  assist back scoot hips back in chair complete assist Ambulation/Gait Ambulation/Gait Assistance: Not tested (comment)    Exercises     PT Diagnosis: Difficulty walking  PT Problem List: Decreased strength;Decreased activity tolerance;Decreased mobility;Decreased range of motion;Decreased knowledge of use of DME;Obesity PT Treatment Interventions: DME instruction;Gait training;Therapeutic activities;Functional mobility training;Therapeutic exercise;Patient/family education   PT Goals Acute Rehab PT Goals PT Goal Formulation: With patient Time For Goal Achievement: 09/20/12 Potential to Achieve Goals: Good Pt will go Supine/Side to Sit: with min assist PT Goal: Supine/Side to Sit - Progress: Goal set today Pt will go Sit to Supine/Side: with min assist PT Goal: Sit to Supine/Side - Progress: Goal set today Pt will go Sit to Stand: with min assist PT Goal: Sit to Stand - Progress: Goal set today Pt will go Stand to Sit: with min assist PT Goal: Stand to Sit - Progress: Goal set today Pt will Ambulate: 16 - 50 feet;with +2 total assist;with least restrictive assistive device (+2 for safety, pt 70%) PT Goal: Ambulate - Progress: Goal set today  Visit Information  Last PT Received On: 09/06/12 Assistance Needed: +2    Subjective Data  Subjective: I can't walk now.   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 Communication Communication: No difficulties    Cognition  Cognition Overall Cognitive Status: Appears within functional limits for tasks assessed/performed Arousal/Alertness: Awake/alert Orientation Level: Appears intact for tasks  assessed Behavior During Session: [facility] for tasks performed    Extremity/Trunk Assessment Right Lower Extremity Assessment RLE ROM/Strength/Tone: Deficits RLE ROM/Strength/Tone  Deficits: grossly 2+/5 throughout, limited by pain in knees as well as body habitus RLE Sensation: WFL - Light Touch Left Lower Extremity Assessment LLE ROM/Strength/Tone: Deficits LLE ROM/Strength/Tone Deficits: grossly 2+/5 throughout, limited by pain in knees as well as body habitus LLE Sensation: WFL - Light Touch   Balance    End of Session PT - End of Session Activity Tolerance: Patient limited by pain Patient left: in chair;with call bell/phone within reach;with family/visitor present Nurse Communication: Need for lift equipment (maximove not sara plus which was outside pt room)  GP     Tim Herrera,Tim Herrera 09/06/2012, 3:54 PM Tim Herrera, PT, DPT 09/06/2012 Pager: [phone]

## 2012-09-19 ENCOUNTER — Encounter: Payer: Self-pay | Admitting: Cardiovascular Disease

## 2012-09-19 ENCOUNTER — Ambulatory Visit (INDEPENDENT_AMBULATORY_CARE_PROVIDER_SITE_OTHER): Payer: Medicare Other | Admitting: Cardiovascular Disease

## 2012-09-19 VITALS — BP 161/93 | HR 80 | Ht 70.0 in | Wt 291.0 lb

## 2012-09-19 DIAGNOSIS — I509 Heart failure, unspecified: Secondary | ICD-10-CM

## 2012-09-19 DIAGNOSIS — I5022 Chronic systolic (congestive) heart failure: Secondary | ICD-10-CM

## 2012-09-19 DIAGNOSIS — I2510 Atherosclerotic heart disease of native coronary artery without angina pectoris: Secondary | ICD-10-CM

## 2012-09-19 DIAGNOSIS — I428 Other cardiomyopathies: Secondary | ICD-10-CM

## 2012-09-19 NOTE — Patient Instructions (Addendum)
Your physician recommends that you schedule a follow-up appointment in:  3 weeks with the [facility]

## 2012-09-19 NOTE — Progress Notes (Signed)
History of Present Illness: 77 yo AAM with history of HTN, non-ischemic cardiomyopathy, CAD, chronic systolic CHF, stage IV CKD who is here today for cardiac follow up. He was initially seen March 2012 for evaluation of an abnormal EKG and c/o lower extremity edema. He was started on Lasix by primary care and had considerable improvement in his lower ext edema. EKG showed NSR, Q waves III, AVF and poor R wave progression through the precordial leads. Echo 08/3010 showed mild LVH with moderate LV systolic dysfunction with LVEF of 30-35%. Stress myoview showed inferior wall scar with small area of possible apical ischemia. Cardiac cath 10/30/10 showed mild plaque in the LAD and Circumflex with severe disease in a very small PDA branch. We pursued medical management at that time. Repeat echo June 2013 with LVEF 25-30%. He was referred to see Dr. Graciela Husbands for consideration for ICD 01/06/12 but he felt that medications should be optimized before ICD would be considered. The patient had stopped his beta blocker. Coreg was started and aldactone was added. He was referred to the CHF clinic by Dr. Graciela Husbands and was seen in the CHF clinic twice in July and August 2013. He was admitted to [facility] 09/03/12 with volume overload, up over 40 lbs. He was diuresed with IV Lasix, guided by Nephrology given his baseline renal insufficiency. Recent worsening of renal function in setting of ureteral obstruction, ureteral stent has been placed. He has done well since discharge.   He is here today for follow up. He is feeling great. No chest pains or SOB. He is down 48 lbs per our office records over last three weeks. Still has lower extremity edema but much improved.   Primary Care Physician: [name]  Past Medical History  Diagnosis Date  . Hypertension   . Diabetes mellitus   . CAD (coronary artery disease)     Cardiac cath May 2012 with mild plaque LAD and Circumflex and severe disease in  very small PDA branch of RCA.    Marland Kitchen Anemia   . Fatty liver disease, nonalcoholic   . Renal cyst   . Morbid obesity   . Chronic kidney disease (CKD), stage IV (severe) 01/26/2012    Followed by Dr Abel Presto at [facility].  R handed. Hx R hydro in Nov 2103 treated with R ureteral stent (UPJ obstruction) but creatinine did not improve (high 2's).     . Acute on chronic combined systolic and diastolic CHF (congestive heart failure) 04/09/2012    Last EF in Jun 2013 was 25-30%     Past Surgical History  Procedure Laterality Date  . Cystoscopy w/ ureteral stent placement  04/15/2012    Procedure: CYSTOSCOPY WITH RETROGRADE PYELOGRAM/URETERAL STENT PLACEMENT;  Surgeon: Milford Cage, MD;  Location: [facility] ORS;  Service: Urology;  Laterality: Right;  Cysto/Right Retrograde Pyelogram/Right Ureteral Stent  . Tonsillectomy      as child    Current Outpatient Prescriptions  Medication Sig Dispense Refill  . albuterol (VENTOLIN HFA) 108 (90 BASE) MCG/ACT inhaler Inhale 2 puffs into the lungs every 4 (four) hours as needed. For shortness of breath.      Marland Kitchen aspirin EC 81 MG tablet Take 81 mg by mouth every morning.       Marland Kitchen atorvastatin (LIPITOR) 20 MG tablet Take 20 mg by mouth daily.      . calcitRIOL (ROCALTROL) 0.25 MCG capsule Take 0.25 mcg by mouth daily.      . carvedilol (COREG) 12.5 MG tablet Take  12.5 mg by mouth 2 (two) times daily with a meal.      . darbepoetin (ARANESP) 100 MCG/0.5ML SOLN Inject 100 mcg into the skin once.       . ferrous sulfate 325 (65 FE) MG tablet Take 325 mg by mouth daily with breakfast.      . furosemide (LASIX) 80 MG tablet 160 MG MG TWICE A DAY      . hydrALAZINE (APRESOLINE) 25 MG tablet Take 12.5 mg by mouth 3 (three) times daily.       . isosorbide mononitrate (IMDUR) 30 MG 24 hr tablet Take 1 tablet (30 mg total) by mouth daily.  30 tablet  6  . spironolactone (ALDACTONE) 25 MG tablet Take 25 mg by mouth daily.       No current facility-administered medications for this visit.    No Known  Allergies  History   Social History  . Marital Status: Married    Spouse Name: N/A    Number of Children: 1  . Years of Education: N/A   Occupational History  . driver     [employer]   Social History Main Topics  . Smoking status: Never Smoker   . Smokeless tobacco: Never Used  . Alcohol Use: No  . Drug Use: No  . Sexually Active: No   Other Topics Concern  . Not on file   Social History Narrative   Driver for [employer].    No family history on file.  Review of Systems:  As stated in the HPI and otherwise negative.   BP 161/93  Pulse 80  Ht 5\' 10"  (1.778 m)  Wt 291 lb (131.997 kg)  BMI 41.75 kg/m2  Physical Examination: General: Well developed, well nourished, NAD HEENT: OP clear, mucus membranes moist SKIN: warm, dry. No rashes. Neuro: No focal deficits Musculoskeletal: Muscle strength 5/5 all ext Psychiatric: Mood and affect normal Neck: No JVD, no carotid bruits, no thyromegaly, no lymphadenopathy. Lungs:Clear bilaterally, no wheezes, rhonci, crackles Cardiovascular: Regular rate and rhythm. No murmurs, gallops or rubs. Abdomen:Soft. Bowel sounds present. Non-tender.  Extremities: 1-2+ bilateral lower extremity edema.   Echo 09/04/12: Left ventricle: Endocardium not well seen. Overall appears to be moderate diffuse hypokinises possibly worse in septum and inferior wall The cavity size was mildly dilated. Wall thickness was increased in a pattern of mild LVH. The estimated ejection fraction was 35%. Diffuse hypokinesis. - Left atrium: The atrium was mildly dilated. - Right ventricle: The cavity size was mildly dilated. Wall thickness was normal.  Assessment and Plan:   1. Non-ischemic cardiomyopathy: He is known to have minor CAD. Will continue medical management of his cardiomyopathy. Continue Coreg, Imdur, Hydralazine, aldactone. He is not on an Ace-inh/ARB secondary to his chronic renal insufficiency. He has been seen in EP  clinic by Dr. Graciela Husbands in 2013 for discussion regarding ICD but most recent echo with LVEF 35%. Will follow.   2. Chronic systolic CHF: He is down over 40 lbs over last 3 weeks. He is currently on Lasix 160 mg po BID. He seems to be doing well with this dose. His renal function is being followed by Dr. Arrie Aran in Nephrology and was checked this week per pt with plans for f/u testing next week. No changes in therapy today. Will arrange f/u in CHF clinic in 3-4 weeks.   3. CAD: Stable. No chest pains.

## 2012-09-22 ENCOUNTER — Encounter (HOSPITAL_COMMUNITY)
Admission: RE | Admit: 2012-09-22 | Discharge: 2012-09-22 | Disposition: A | Discharge status: Home / Self Care | Payer: Medicare Other | Source: Ambulatory Visit | Attending: Nephrology | Admitting: Nephrology

## 2012-09-22 DIAGNOSIS — N184 Chronic kidney disease, stage 4 (severe): Secondary | ICD-10-CM | POA: Insufficient documentation

## 2012-09-22 LAB — RENAL FUNCTION PANEL
Albumin: 3 g/dL — ABNORMAL LOW (ref 3.5–5.2)
Calcium: 8.6 mg/dL (ref 8.4–10.5)
GFR calc Af Amer: 26 mL/min — ABNORMAL LOW (ref >90)
Phosphorus: 3.6 mg/dL (ref 2.3–4.6)
Potassium: 3.5 mEq/L (ref 3.5–5.1)
Sodium: 138 mEq/L (ref 135–145)

## 2012-09-22 LAB — IRON AND TIBC: Iron: 38 ug/dL — ABNORMAL LOW (ref 42–135)

## 2012-09-22 MED ORDER — DARBEPOETIN ALFA-POLYSORBATE 100 MCG/0.5ML IJ SOLN
100.0000 ug | INTRAMUSCULAR | Status: DC
  Administered 2012-09-22: 100 ug via SUBCUTANEOUS

## 2012-09-22 MED ORDER — DARBEPOETIN ALFA-POLYSORBATE 100 MCG/0.5ML IJ SOLN
INTRAMUSCULAR | Status: AC
  Filled 2012-09-22: qty 0.5

## 2012-09-27 ENCOUNTER — Encounter: Payer: Self-pay | Admitting: Vascular Surgery

## 2012-09-28 ENCOUNTER — Encounter (INDEPENDENT_AMBULATORY_CARE_PROVIDER_SITE_OTHER): Payer: Medicare Other | Admitting: *Deleted

## 2012-09-28 ENCOUNTER — Encounter: Payer: Self-pay | Admitting: *Deleted

## 2012-09-28 ENCOUNTER — Other Ambulatory Visit: Payer: Self-pay | Admitting: *Deleted

## 2012-09-28 ENCOUNTER — Encounter: Payer: Self-pay | Admitting: Vascular Surgery

## 2012-09-28 ENCOUNTER — Ambulatory Visit (INDEPENDENT_AMBULATORY_CARE_PROVIDER_SITE_OTHER): Payer: Medicare Other | Admitting: Vascular Surgery

## 2012-09-28 VITALS — BP 177/88 | HR 74 | Ht 70.0 in | Wt 277.0 lb

## 2012-09-28 DIAGNOSIS — Z01810 Encounter for preprocedural cardiovascular examination: Secondary | ICD-10-CM

## 2012-09-28 DIAGNOSIS — N186 End stage renal disease: Secondary | ICD-10-CM

## 2012-09-28 NOTE — Progress Notes (Signed)
[facility]  HISTORY AND PHYSICAL   CC: ESRD Referring Physician: Dr Arrie Aran  History of Present Illness: Tim Herrera is a 77 y.o. male With hx of hydronephroses of right kidney with ureteral stent, DM, HTN, CHF, CAD and CKD stage 4 who was sent to [place] for placement of HD access for future HD. Pt states he was told he may need to start HD in the next month or two. He is RHD. He denies neuropathy in either hand.   Past Medical History  Diagnosis Date  . Hypertension   . Diabetes mellitus   . CAD (coronary artery disease)     Cardiac cath May 2012 with mild plaque LAD and Circumflex and severe disease in  very small PDA branch of RCA.   Marland Kitchen Anemia   . Fatty liver disease, nonalcoholic   . Renal cyst   . Morbid obesity   . Chronic kidney disease (CKD), stage IV (severe) 01/26/2012    Followed by Dr Abel Presto at [facility]  R handed. Hx R hydro in Nov 2103 treated with R ureteral stent (UPJ obstruction) but creatinine did not improve (high 2's).     . Acute on chronic combined systolic and diastolic CHF (congestive heart failure) 04/09/2012    Last EF in Jun 2013 was 25-30%     ROS: [x]  Positive   [ ]  Denies    General: [ ]  Weight loss,[x]  weight gain [ ]  Fever, [ ]  chills Neurologic: [ ]  Dizziness, [ ]  Blackouts, [ ]  Seizure [ ]  Stroke, [ ]  "Mini stroke", [ ]  Slurred speech, [ ]  Temporary blindness; [ ]  weakness in arms or legs, [ ]  Hoarseness Cardiac: [ ]  Chest pain/pressure, [ ]  Shortness of breath at rest [ ]  Shortness of breath with exertion, [ ]  Atrial fibrillation or irregular heartbeat Vascular: [ ]  Pain in legs with walking, [ ]  Pain in legs at rest, [ ]  Pain in legs at night,  [ ]  Non-healing ulcer, [ ]  Blood clot in vein/DVT,   Pulmonary: [ ]  Home oxygen, [ ]  Productive cough, [ ]  Coughing up blood, [x ] Asthma,  [ ]  Wheezing Musculoskeletal:  [ ]  Arthritis, [ ]  Low back pain, [ ]  Joint pain Hematologic: [ ]  Easy Bruising, [ ]  Anemia; [ ]   Hepatitis Gastrointestinal: [ ]  Blood in stool, [ ]  Gastroesophageal Reflux/heartburn, [ ]  Trouble swallowing Urinary: [x ] chronic Kidney disease, [ ]  on HD - [ ]  MWF or [ ]  TTHS, [ ]  Burning with urination, [ ]  Difficulty urinating Skin: [ ]  Rashes, [ ]  Wounds Psychological: [ ]  Anxiety, [ ]  Depression   Social History History  Substance Use Topics  . Smoking status: Never Smoker   . Smokeless tobacco: Never Used  . Alcohol Use: No    Family History Family History  Problem Relation Age of Onset  . Hypertension Mother   . Heart attack Brother     No Known Allergies  Current Outpatient Prescriptions  Medication Sig Dispense Refill  . albuterol (VENTOLIN HFA) 108 (90 BASE) MCG/ACT inhaler Inhale 2 puffs into the lungs every 4 (four) hours as needed. For shortness of breath.      Marland Kitchen aspirin EC 81 MG tablet Take 81 mg by mouth every morning.       Marland Kitchen atorvastatin (LIPITOR) 20 MG tablet Take 20 mg by mouth daily.      . calcitRIOL (ROCALTROL) 0.25 MCG capsule Take 0.25 mcg by mouth daily.      Marland Kitchen  carvedilol (COREG) 12.5 MG tablet Take 3.125 mg by mouth 2 (two) times daily with a meal.       . ferrous sulfate 325 (65 FE) MG tablet Take 325 mg by mouth daily with breakfast.      . furosemide (LASIX) 80 MG tablet 160 MG MG TWICE A DAY      . hydrALAZINE (APRESOLINE) 25 MG tablet Take 12.5 mg by mouth 3 (three) times daily.       . isosorbide mononitrate (IMDUR) 30 MG 24 hr tablet Take 1 tablet (30 mg total) by mouth daily.  30 tablet  6  . spironolactone (ALDACTONE) 25 MG tablet Take 25 mg by mouth daily.      . darbepoetin (ARANESP) 100 MCG/0.5ML SOLN Inject 100 mcg into the skin once.        No current facility-administered medications for this visit.    Physical Examination  Filed Vitals:   09/28/12 1327  BP: 177/88  Pulse: 74    Body mass index is 39.75 kg/(m^2).  General:  WDWN in NAD Gait: Normal HENT: WNL Eyes: Pupils equal Pulmonary: normal non-labored breathing ,  without Rales, rhonchi,  wheezing Cardiac: RRR, without  Murmurs, rubs or gallops; No carotid bruits Abdomen: soft, NT, Skin: no rashes, ulcers noted Vascular Exam/Pulses: 2+ radial and ulnar pulses palpable  Extremities without ischemic changes, no Gangrene , no cellulitis; no open wounds;  Musculoskeletal: no muscle wasting or atrophy  Neurologic: A&O X 3; Appropriate Affect ; SENSATION: normal; MOTOR FUNCTION:  moving all extremities equally. Speech is fluent/normal  Non-Invasive Vascular Imaging: 09/28/2012 Vein mapping shows left basilic vein > 3mm. All other cephalic and right BV are small  ASSESSMENT: Tim Herrera is a 77 y.o. male  with ESRD who may need HD in the next few months PLAN: Left BVT 10/09/12 Risks benefits and surgical procedure were explained to the pt. Signs and symptoms of steal syndrome also explained to the pt. Clinic MD: CE Fields, MD   History and exam details as above. The patient has a very small forearm cephalic veins bilaterally. His basilic vein in the left side seems like the most reasonable vein to consider a fistula. Multiple medical problems approaching the need for hemodialysis.  Left basilic vein transposition fistula on April 28. Risks benefits possible complications procedure details were explained the patient today including but not limited to bleeding infection non-maturation fistula ischemic steal. He understands and agrees to proceed. He will need general anesthesia.  Fabienne Bruns, MD [facility] [place] Office: [phone] Pager: [phone]

## 2012-10-05 ENCOUNTER — Encounter (HOSPITAL_COMMUNITY)
Admission: RE | Admit: 2012-10-05 | Discharge: 2012-10-05 | Disposition: A | Discharge status: Home / Self Care | Payer: Medicare Other | Source: Ambulatory Visit | Attending: Vascular Surgery | Admitting: Vascular Surgery

## 2012-10-05 ENCOUNTER — Encounter (HOSPITAL_COMMUNITY): Payer: Self-pay

## 2012-10-05 LAB — CBC
MCV: 85.1 fL (ref 78.0–100.0)
Platelets: 206 10*3/uL (ref 150–400)
RBC: 3.88 MIL/uL — ABNORMAL LOW (ref 4.22–5.81)
RDW: 14.7 % (ref 11.5–15.5)
WBC: 4.3 10*3/uL (ref 4.0–10.5)

## 2012-10-05 LAB — COMPREHENSIVE METABOLIC PANEL
ALT: 7 U/L (ref 0–53)
AST: 12 U/L (ref 0–37)
Albumin: 3.2 g/dL — ABNORMAL LOW (ref 3.5–5.2)
Alkaline Phosphatase: 184 U/L — ABNORMAL HIGH (ref 39–117)
CO2: 25 mEq/L (ref 19–32)
Chloride: 103 mEq/L (ref 96–112)
Creatinine, Ser: 2.92 mg/dL — ABNORMAL HIGH (ref 0.50–1.35)
GFR calc non Af Amer: 20 mL/min — ABNORMAL LOW (ref >90)
Potassium: 4.2 mEq/L (ref 3.5–5.1)
Sodium: 139 mEq/L (ref 135–145)
Total Bilirubin: 0.5 mg/dL (ref 0.3–1.2)

## 2012-10-05 LAB — SURGICAL PCR SCREEN: MRSA, PCR: NEGATIVE

## 2012-10-05 NOTE — Progress Notes (Signed)
10/05/12 1257  OBSTRUCTIVE SLEEP APNEA  Have you ever been diagnosed with sleep apnea through a sleep study? No  Do you snore loudly (loud enough to be heard through closed doors)?  0  Do you often feel tired, fatigued, or sleepy during the daytime? 0  Has anyone observed you stop breathing during your sleep? 0  Do you have, or are you being treated for high blood pressure? 1  BMI more than 35 kg/m2? 1  Age over 74 years old? 1  Neck circumference greater than 40 cm/18 inches? 0  Gender: 1  Obstructive Sleep Apnea Score 4  Score 4 or greater  Results sent to PCP

## 2012-10-05 NOTE — Pre-Procedure Instructions (Signed)
Tim Herrera  10/05/2012   Your procedure is scheduled on:  Monday, April 28th  Report to [facility] at 0830 AM.  Call this number if you have problems the morning of surgery: [phone]   Remember:   Do not eat food or drink liquids after midnight.    Take these medicines the morning of surgery with A SIP OF WATER: Coreg, Hydralazine, Imdur, Albuterol if needed   Do not wear jewelry.  Do not wear lotions, powders, or perfumes,deodorant.  Do not shave 48 hours prior to surgery. Men may shave face and neck.  Do not bring valuables to the hospital.  Contacts, dentures or bridgework may not be worn into surgery.  Leave suitcase in the car. After surgery it may be brought to your room.  For patients admitted to the hospital, checkout time is 11:00 AM the day of discharge.   Patients discharged the day of surgery will not be allowed to drive home.    Special Instructions: Shower using CHG 2 nights before surgery and the night before surgery.  If you shower the day of surgery use CHG.  Use special wash - you have one bottle of CHG for all showers.  You should use approximately 1/3 of the bottle for each shower.   Please read over the following fact sheets that you were given: Pain Booklet, Coughing and Deep Breathing, MRSA Information and Surgical Site Infection Prevention

## 2012-10-05 NOTE — Progress Notes (Signed)
Primary Physician - [facility] Cardiologist - Dr. Sanjuana Kava Echo, stress test, ekg, chest xray in epic

## 2012-10-06 ENCOUNTER — Encounter (HOSPITAL_COMMUNITY): Payer: Self-pay

## 2012-10-06 NOTE — Progress Notes (Signed)
Anesthesia chart review: Patient is a 77 year old male scheduled for left basilic vein transposition by Dr. Darrick Penna on 10/09/2012. History includes CKD stage IV, HTN, DM2, anemia, non-alcoholic fatty liver, non-ischemic cardiomyopathy, chronic combined CHF, non-smoker, morbid obesity, tonsillectomy.  He had a right kidney ureteral stent placed 05/03/12 and 07/11/12. Nephrologist is Dr. Arrie Aran. He is not yet on hemodialysis.  Cardiologist is Dr. Clifton James, last visit 09/19/12.  Continued medical management for cardiomyopathy was recommended. His EF had increased to 35% from 25-30% with medical therapy, so it appears plans for consideration of ICD are on hold for the moment.  No changes were made in his CHF therapy, but Dr. Clifton James did arrange for CHF follow-up at the CHF Clinic in 3-4 weeks, scheduled for 10/10/12.    Echo on 09/04/12 showed: - Left ventricle: Endocardium not well seen. Overall appears to be moderate diffuse hypokinises possibly worse in septum and inferior wall The cavity size was mildly dilated. Wall thickness was increased in a pattern of mild LVH. The estimated ejection fraction was 35%. Diffuse hypokinesis. - Trivial mitral regurgitation.  Mild pulmonic an tricuspid regurgitation. - Left atrium: The atrium was mildly dilated. - Right ventricle: The cavity size was mildly dilated. Wall thickness was normal. (Previous EF on 12/03/11 was 25-30%.)  Nuclear stress test on 09/29/10 showed: Abnormal stress nuclear study with prior inferior infarct and mild ischemia in the basal anterior wall. EF 37%.  This lead to a cardiac cath on 10/30/10 that showed 1. Nonobstructive coronary artery disease in the major epicardial vessels. 2. Severe subtotal occlusion in a very small-caliber posterior descending artery branch. This vessel is too small for intervention. The vessel was approximately a 1.5-1.75-mm vessel.  Medical therapy was recommended.  EKG on 09/03/12 showed SR, first degree AVB, LAFB, low  voltage QRS, consider anterior infarct, non-specific T wave abnormality.  CXR report on 323/14 showed:Technical factors related to patient body habitus reduce diagnostic sensitivity and specificity. Moderate cardiomegaly noted with pulmonary vascular indistinctness and mild interstitial accentuation. No overt airspace edema noted. Posterior costophrenic angles somewhat indistinct, possibly due to body habitus. I cannot exclude small pleural effusions. Mild lower thoracic spondylosis noted.   Preoperative labs noted.  He is for a ISTAT on the day of surgery.  Patient has known non-ischemic cardiomyopathy with chronic CHF and non-obstructive CAD.  He is in need of hemodialysis in the near future and needs access.  He has been seen by his cardiologist within the past month, and no changes were made in his medical therapy at that time.  He will be evaluated by his assigned anesthesiologist on the day of surgery.  If no acute CV/CHF symptoms and labs are reasonable then would anticipate he would be okay to proceed. Anesthesiologist Dr. Noreene Larsson agrees with this plan.  Velna [facility]/Anesthesiology Phone [phone] 10/06/2012 1:39 PM

## 2012-10-08 MED ORDER — DEXTROSE 5 % IV SOLN
1.5000 g | INTRAVENOUS | Status: AC
  Administered 2012-10-09: 1.5 g via INTRAVENOUS
  Filled 2012-10-08: qty 1.5

## 2012-10-09 ENCOUNTER — Encounter (HOSPITAL_COMMUNITY): Payer: Self-pay | Admitting: Vascular Surgery

## 2012-10-09 ENCOUNTER — Encounter (HOSPITAL_COMMUNITY)
Admission: RE | Disposition: A | Discharge status: Home / Self Care | Payer: Self-pay | Source: Ambulatory Visit | Attending: Vascular Surgery

## 2012-10-09 ENCOUNTER — Ambulatory Visit (HOSPITAL_COMMUNITY)
Admission: RE | Admit: 2012-10-09 | Discharge: 2012-10-09 | Disposition: A | Discharge status: Home / Self Care | Payer: Medicare Other | Source: Ambulatory Visit | Attending: Vascular Surgery | Admitting: Vascular Surgery

## 2012-10-09 ENCOUNTER — Encounter (HOSPITAL_COMMUNITY): Payer: Self-pay | Admitting: *Deleted

## 2012-10-09 ENCOUNTER — Ambulatory Visit (HOSPITAL_COMMUNITY): Payer: Medicare Other | Admitting: Anesthesiology

## 2012-10-09 DIAGNOSIS — K7689 Other specified diseases of liver: Secondary | ICD-10-CM | POA: Insufficient documentation

## 2012-10-09 DIAGNOSIS — N186 End stage renal disease: Secondary | ICD-10-CM

## 2012-10-09 DIAGNOSIS — E119 Type 2 diabetes mellitus without complications: Secondary | ICD-10-CM | POA: Insufficient documentation

## 2012-10-09 DIAGNOSIS — Z6839 Body mass index (BMI) 39.0-39.9, adult: Secondary | ICD-10-CM | POA: Insufficient documentation

## 2012-10-09 DIAGNOSIS — I509 Heart failure, unspecified: Secondary | ICD-10-CM | POA: Insufficient documentation

## 2012-10-09 DIAGNOSIS — I2510 Atherosclerotic heart disease of native coronary artery without angina pectoris: Secondary | ICD-10-CM | POA: Insufficient documentation

## 2012-10-09 DIAGNOSIS — N1330 Unspecified hydronephrosis: Secondary | ICD-10-CM | POA: Insufficient documentation

## 2012-10-09 DIAGNOSIS — Z7982 Long term (current) use of aspirin: Secondary | ICD-10-CM | POA: Insufficient documentation

## 2012-10-09 DIAGNOSIS — I120 Hypertensive chronic kidney disease with stage 5 chronic kidney disease or end stage renal disease: Secondary | ICD-10-CM | POA: Insufficient documentation

## 2012-10-09 DIAGNOSIS — Z79899 Other long term (current) drug therapy: Secondary | ICD-10-CM | POA: Insufficient documentation

## 2012-10-09 HISTORY — PX: BASCILIC VEIN TRANSPOSITION: SHX5742

## 2012-10-09 LAB — GLUCOSE, CAPILLARY: Glucose-Capillary: 111 mg/dL — ABNORMAL HIGH (ref 70–99)

## 2012-10-09 LAB — POCT I-STAT 4, (NA,K, GLUC, HGB,HCT)
Hemoglobin: 11.6 g/dL — ABNORMAL LOW (ref 13.0–17.0)
Sodium: 144 mEq/L (ref 135–145)

## 2012-10-09 SURGERY — TRANSPOSITION, VEIN, BASILIC
Anesthesia: General | Site: Arm Upper | Laterality: Left | Wound class: Clean

## 2012-10-09 MED ORDER — FENTANYL CITRATE 0.05 MG/ML IJ SOLN
INTRAMUSCULAR | Status: DC | PRN
  Administered 2012-10-09 (×6): 50 ug via INTRAVENOUS

## 2012-10-09 MED ORDER — OXYCODONE HCL 5 MG/5ML PO SOLN: 5.0000 mg | Freq: Once | ORAL | Status: DC | PRN

## 2012-10-09 MED ORDER — PROTAMINE SULFATE 10 MG/ML IV SOLN
INTRAVENOUS | Status: DC | PRN
  Administered 2012-10-09: 20 mg via INTRAVENOUS
  Administered 2012-10-09: 10 mg via INTRAVENOUS
  Administered 2012-10-09: 20 mg via INTRAVENOUS

## 2012-10-09 MED ORDER — HYDROMORPHONE HCL PF 1 MG/ML IJ SOLN: 0.2500 mg | INTRAMUSCULAR | Status: DC | PRN

## 2012-10-09 MED ORDER — PROPOFOL 10 MG/ML IV BOLUS
INTRAVENOUS | Status: DC | PRN
  Administered 2012-10-09: 20 mg via INTRAVENOUS
  Administered 2012-10-09: 30 mg via INTRAVENOUS
  Administered 2012-10-09: 110 mg via INTRAVENOUS

## 2012-10-09 MED ORDER — LIDOCAINE HCL (PF) 1 % IJ SOLN
INTRAMUSCULAR | Status: AC
  Filled 2012-10-09: qty 30

## 2012-10-09 MED ORDER — 0.9 % SODIUM CHLORIDE (POUR BTL) OPTIME
TOPICAL | Status: DC | PRN
  Administered 2012-10-09: 1000 mL

## 2012-10-09 MED ORDER — OXYCODONE HCL 5 MG PO TABS: 5.0000 mg | ORAL_TABLET | Freq: Four times a day (QID) | ORAL | Status: DC | PRN

## 2012-10-09 MED ORDER — THROMBIN 20000 UNITS EX SOLR
CUTANEOUS | Status: AC
  Filled 2012-10-09: qty 20000

## 2012-10-09 MED ORDER — SODIUM CHLORIDE 0.9 % IV SOLN
INTRAVENOUS | Status: DC | PRN
  Administered 2012-10-09: 11:00:00 via INTRAVENOUS

## 2012-10-09 MED ORDER — EPHEDRINE SULFATE 50 MG/ML IJ SOLN
INTRAMUSCULAR | Status: DC | PRN
  Administered 2012-10-09 (×2): 5 mg via INTRAVENOUS

## 2012-10-09 MED ORDER — ARTIFICIAL TEARS OP OINT
TOPICAL_OINTMENT | OPHTHALMIC | Status: DC | PRN
  Administered 2012-10-09: 1 via OPHTHALMIC

## 2012-10-09 MED ORDER — MIDAZOLAM HCL 5 MG/5ML IJ SOLN
INTRAMUSCULAR | Status: DC | PRN
  Administered 2012-10-09: 1 mg via INTRAVENOUS

## 2012-10-09 MED ORDER — SODIUM CHLORIDE 0.9 % IR SOLN
Status: DC | PRN
  Administered 2012-10-09: 11:00:00

## 2012-10-09 MED ORDER — HEPARIN SODIUM (PORCINE) 1000 UNIT/ML IJ SOLN
INTRAMUSCULAR | Status: DC | PRN
  Administered 2012-10-09: 5000 [IU] via INTRAVENOUS

## 2012-10-09 MED ORDER — SODIUM CHLORIDE 0.9 % IV SOLN
INTRAVENOUS | Status: DC
  Administered 2012-10-09: 35 mL/h via INTRAVENOUS

## 2012-10-09 MED ORDER — PHENYLEPHRINE HCL 10 MG/ML IJ SOLN
INTRAMUSCULAR | Status: DC | PRN
  Administered 2012-10-09: 80 ug via INTRAVENOUS
  Administered 2012-10-09: 40 ug via INTRAVENOUS

## 2012-10-09 MED ORDER — OXYCODONE HCL 5 MG PO TABS: 5.0000 mg | ORAL_TABLET | Freq: Once | ORAL | Status: DC | PRN

## 2012-10-09 MED ORDER — ONDANSETRON HCL 4 MG/2ML IJ SOLN
INTRAMUSCULAR | Status: DC | PRN
  Administered 2012-10-09: 4 mg via INTRAVENOUS

## 2012-10-09 MED ORDER — ONDANSETRON HCL 4 MG/2ML IJ SOLN: 4.0000 mg | Freq: Once | INTRAMUSCULAR | Status: DC | PRN

## 2012-10-09 MED ORDER — LIDOCAINE HCL (CARDIAC) 20 MG/ML IV SOLN
INTRAVENOUS | Status: DC | PRN
  Administered 2012-10-09: 100 mg via INTRAVENOUS

## 2012-10-09 MED ORDER — MEPERIDINE HCL 25 MG/ML IJ SOLN: 6.2500 mg | INTRAMUSCULAR | Status: DC | PRN

## 2012-10-09 SURGICAL SUPPLY — 42 items
CANISTER SUCTION 2500CC (MISCELLANEOUS) ×2 IMPLANT
CLIP TI MEDIUM 24 (CLIP) ×2 IMPLANT
CLIP TI WIDE RED SMALL 24 (CLIP) ×2 IMPLANT
CLOTH BEACON ORANGE TIMEOUT ST (SAFETY) ×2 IMPLANT
COVER PROBE W GEL 5X96 (DRAPES) ×2 IMPLANT
COVER SURGICAL LIGHT HANDLE (MISCELLANEOUS) ×2 IMPLANT
DECANTER SPIKE VIAL GLASS SM (MISCELLANEOUS) IMPLANT
DERMABOND ADVANCED (GAUZE/BANDAGES/DRESSINGS) ×2
DERMABOND ADVANCED .7 DNX12 (GAUZE/BANDAGES/DRESSINGS) ×2 IMPLANT
DRAIN PENROSE 1/4X12 LTX STRL (WOUND CARE) ×2 IMPLANT
ELECT REM PT RETURN 9FT ADLT (ELECTROSURGICAL) ×2
ELECTRODE REM PT RTRN 9FT ADLT (ELECTROSURGICAL) ×1 IMPLANT
GEL ULTRASOUND 20GR AQUASONIC (MISCELLANEOUS) ×2 IMPLANT
GLOVE BIO SURGEON STRL SZ 6.5 (GLOVE) ×4 IMPLANT
GLOVE BIO SURGEON STRL SZ7.5 (GLOVE) ×2 IMPLANT
GLOVE BIOGEL PI IND STRL 6.5 (GLOVE) ×3 IMPLANT
GLOVE BIOGEL PI IND STRL 7.5 (GLOVE) ×2 IMPLANT
GLOVE BIOGEL PI INDICATOR 6.5 (GLOVE) ×3
GLOVE BIOGEL PI INDICATOR 7.5 (GLOVE) ×2
GLOVE ECLIPSE 6.0 STRL STRAW (GLOVE) ×2 IMPLANT
GLOVE SURG SS PI 7.5 STRL IVOR (GLOVE) ×4 IMPLANT
GOWN PREVENTION PLUS XLARGE (GOWN DISPOSABLE) ×6 IMPLANT
GOWN STRL NON-REIN LRG LVL3 (GOWN DISPOSABLE) ×4 IMPLANT
KIT BASIN OR (CUSTOM PROCEDURE TRAY) ×2 IMPLANT
KIT ROOM TURNOVER OR (KITS) ×2 IMPLANT
LOOP VESSEL MINI RED (MISCELLANEOUS) IMPLANT
NS IRRIG 1000ML POUR BTL (IV SOLUTION) ×2 IMPLANT
PACK CV ACCESS (CUSTOM PROCEDURE TRAY) ×2 IMPLANT
PAD ARMBOARD 7.5X6 YLW CONV (MISCELLANEOUS) ×4 IMPLANT
SPONGE SURGIFOAM ABS GEL 100 (HEMOSTASIS) IMPLANT
SUT PROLENE 6 0 CC (SUTURE) ×4 IMPLANT
SUT SILK 2 0 SH (SUTURE) ×2 IMPLANT
SUT SILK 3 0 (SUTURE) ×2
SUT SILK 3-0 18XBRD TIE 12 (SUTURE) ×2 IMPLANT
SUT VIC AB 3-0 SH 27 (SUTURE) ×3
SUT VIC AB 3-0 SH 27X BRD (SUTURE) ×3 IMPLANT
SUT VICRYL 4-0 PS2 18IN ABS (SUTURE) ×8 IMPLANT
TAPE UMBILICAL COTTON 1/8X30 (MISCELLANEOUS) ×2 IMPLANT
TOWEL OR 17X24 6PK STRL BLUE (TOWEL DISPOSABLE) ×2 IMPLANT
TOWEL OR 17X26 10 PK STRL BLUE (TOWEL DISPOSABLE) ×2 IMPLANT
UNDERPAD 30X30 INCONTINENT (UNDERPADS AND DIAPERS) ×2 IMPLANT
WATER STERILE IRR 1000ML POUR (IV SOLUTION) ×2 IMPLANT

## 2012-10-09 NOTE — Anesthesia Procedure Notes (Signed)
Procedure Name: LMA Insertion Date/Time: 10/09/2012 10:41 AM Performed by: Lovie Chol Pre-anesthesia Checklist: Patient identified, Emergency Drugs available, Suction available, Patient being monitored and Timeout performed Patient Re-evaluated:Patient Re-evaluated prior to inductionOxygen Delivery Method: Circle system utilized Preoxygenation: Pre-oxygenation with 100% oxygen Intubation Type: IV induction Ventilation: Mask ventilation without difficulty LMA: LMA with gastric port inserted LMA Size: 5.0 Number of attempts: 1 Placement Confirmation: positive ETCO2,  CO2 detector and breath sounds checked- equal and bilateral Tube secured with: Tape Dental Injury: Teeth and Oropharynx as per pre-operative assessment

## 2012-10-09 NOTE — Preoperative (Signed)
Beta Blockers   Reason not to administer Beta Blockers:Not Applicable 

## 2012-10-09 NOTE — Transfer of Care (Signed)
Immediate Anesthesia Transfer of Care Note  Patient: Tim Herrera  Procedure(s) Performed: Procedure(s) with comments: BASCILIC VEIN TRANSPOSITION (Left) - Ultrasound guided  Patient Location: PACU  Anesthesia Type:General  Level of Consciousness: awake, oriented and patient cooperative  Airway & Oxygen Therapy: Patient Spontanous Breathing and Patient connected to nasal cannula oxygen  Post-op Assessment: Report given to PACU RN and Post -op Vital signs reviewed and stable  Post vital signs: Reviewed and stable  Complications: No apparent anesthesia complications

## 2012-10-09 NOTE — Interval H&P Note (Signed)
History and Physical Interval Note:  10/09/2012 9:53 AM  Tim Herrera  has presented today for surgery, with the diagnosis of ESRD  The various methods of treatment have been discussed with the patient and family. After consideration of risks, benefits and other options for treatment, the patient has consented to  Procedure(s): BASCILIC VEIN TRANSPOSITION (Left) as a surgical intervention .  The patient's history has been reviewed, patient examined, no change in status, stable for surgery.  I have reviewed the patient's chart and labs.  Questions were answered to the patient's satisfaction.     Tim Herrera E

## 2012-10-09 NOTE — Op Note (Signed)
Procedure: Left basilic vein transposition fistula  Preoperative diagnosis: End-stage renal disease  Postoperative diagnosis: Same  Anesthesia: Gen.  Assistant: Doreatha Massed, PA-C  Operative findings: 3-5 mm left basilic vein, 3 mm left brachial artery  Operative details: After obtaining informed consent, the patient was taken to the operating room. The patient was placed in supine position operating table. After induction of general anesthesia, the patient's entire left upper extremity was prepped and draped in the usual sterile fashion. Next ultrasound was used to identify the left basilic vein. A longitudinal incision was made just above the antecubital crease in order to expose the basilic vein. The vein was of good quality approximately 3-5 mm in diameter throughout its course. Several longitudinal skip incisions were made up the arm from just below the antecubital crease all the way up to the axilla to harvest the basilic vein. Care was taken to try to not injure any sensory nerves and all the motor nerves were identified and protected. The vein was dissected free circumferentially and small side branches ligated and divided between silk ties or clips. Next the brachial artery was exposed by deepening the basilic vein harvest incision just above the antecubital crease. The artery was approximately 3 mm in diameter. This was dissected free circumferentially. Vessel loops were placed around it. There was some spasm within the artery after dissecting it free. Next the distal basilic vein was ligated with a 2 silk tie and the vein transected. The vein was brought out throughout the skip incisions and gently distended with heparinized saline and marked for orientation. The vein was then tunneled subcutaneously in an arcing configuration out over the biceps muscle down to the level of the exposed brachial artery just above the antecubital crease. The patient was given 5000 units of intravenous heparin.  Vessel loops were used to control the artery proximally and distally. The vein was cut to length and sewn end of vein to side of artery using a running 7-0 Prolene suture. Just prior completion of the anastomosis, it was forebled backbled and thoroughly flushed. The anastomosis was secured Vesseloops were released.   Initially the flow was fairly sluggish due to spasm. However there was a palpable thrill after a few minutes of the proximal aspect of the fistula. There was also good Doppler flow throughout the course of the fistula. The distal vein was also noted to fill fully. At this point all subcutaneous tissues were reapproximated using running 3-0 Vicryl suture. All skin incisions were closed with running 4  0 Vicryl subcuticular stitch. Dermabond was applied to all incisions. The patient tolerated the procedure well and there were no complications. Instrument sponge needle counts were correct at the end of the case. The patient had a palpable radial pulse at the end of the case.  The patient was taken to the recovery room in stable condition.  Fabienne Bruns, MD [facility] [place] Office: [phone] Pager: [phone]

## 2012-10-09 NOTE — Anesthesia Preprocedure Evaluation (Addendum)
Anesthesia Evaluation  Patient identified by MRN, date of birth, ID band Patient awake    Reviewed: Allergy & Precautions, H&P , NPO status , Patient's Chart, lab work & pertinent test results  History of Anesthesia Complications Negative for: history of anesthetic complications  Airway Mallampati: I TM Distance: >3 FB Neck ROM: Full    Dental  (+) Dental Advisory Given   Pulmonary          Cardiovascular hypertension, Pt. on medications +CHF     Neuro/Psych    GI/Hepatic negative GI ROS, Neg liver ROS,   Endo/Other  diabetes, Well Controlled, Type 2, Oral Hypoglycemic Agents  Renal/GU CRFRenal disease     Musculoskeletal   Abdominal   Peds  Hematology   Anesthesia Other Findings   Reproductive/Obstetrics                         Anesthesia Physical Anesthesia Plan  ASA: III  Anesthesia Plan: General   Post-op Pain Management:    Induction: Intravenous  Airway Management Planned: LMA  Additional Equipment:   Intra-op Plan:   Post-operative Plan: Extubation in OR  Informed Consent: I have reviewed the patients History and Physical, chart, labs and discussed the procedure including the risks, benefits and alternatives for the proposed anesthesia with the patient or authorized representative who has indicated his/her understanding and acceptance.     Plan Discussed with: CRNA and Surgeon  Anesthesia Plan Comments:         Anesthesia Quick Evaluation

## 2012-10-09 NOTE — OR Nursing (Signed)
Off monitor , awaiting  Transport to post op

## 2012-10-09 NOTE — H&P (View-Only) (Signed)
[facility] HISTORY AND PHYSICAL   CC: ESRD Referring Physician: Dr Coladonato  History of Present Illness: Tim Herrera is a 77 y.o. male With hx of hydronephroses of right kidney with ureteral stent, DM, HTN, CHF, CAD and CKD stage 4 who was sent to us for placement of HD access for future HD. Pt states he was told he may need to start HD in the next month or two. He is RHD. He denies neuropathy in either hand.   Past Medical History  Diagnosis Date  . Hypertension   . Diabetes mellitus   . CAD (coronary artery disease)     Cardiac cath May 2012 with mild plaque LAD and Circumflex and severe disease in  very small PDA branch of RCA.   . Anemia   . Fatty liver disease, nonalcoholic   . Renal cyst   . Morbid obesity   . Chronic kidney disease (CKD), stage IV (severe) 01/26/2012    Followed by Dr Colodonato at [facility].  R handed. Hx R hydro in Nov 2103 treated with R ureteral stent (UPJ obstruction) but creatinine did not improve (high 2's).     . Acute on chronic combined systolic and diastolic CHF (congestive heart failure) 04/09/2012    Last EF in Jun 2013 was 25-30%     ROS: [x] Positive   [ ] Denies    General: [ ] Weight loss,[x] weight gain [ ] Fever, [ ] chills Neurologic: [ ] Dizziness, [ ] Blackouts, [ ] Seizure [ ] Stroke, [ ] "Mini stroke", [ ] Slurred speech, [ ] Temporary blindness; [ ] weakness in arms or legs, [ ] Hoarseness Cardiac: [ ] Chest pain/pressure, [ ] Shortness of breath at rest [ ] Shortness of breath with exertion, [ ] Atrial fibrillation or irregular heartbeat Vascular: [ ] Pain in legs with walking, [ ] Pain in legs at rest, [ ] Pain in legs at night,  [ ] Non-healing ulcer, [ ] Blood clot in vein/DVT,   Pulmonary: [ ] Home oxygen, [ ] Productive cough, [ ] Coughing up blood, [x ] Asthma,  [ ] Wheezing Musculoskeletal:  [ ] Arthritis, [ ] Low back pain, [ ] Joint pain Hematologic: [ ] Easy Bruising, [ ] Anemia; [ ]  Hepatitis Gastrointestinal: [ ] Blood in stool, [ ] Gastroesophageal Reflux/heartburn, [ ] Trouble swallowing Urinary: [x ] chronic Kidney disease, [ ] on HD - [ ] MWF or [ ] TTHS, [ ] Burning with urination, [ ] Difficulty urinating Skin: [ ] Rashes, [ ] Wounds Psychological: [ ] Anxiety, [ ] Depression   Social History History  Substance Use Topics  . Smoking status: Never Smoker   . Smokeless tobacco: Never Used  . Alcohol Use: No    Family History Family History  Problem Relation Age of Onset  . Hypertension Mother   . Heart attack Brother     No Known Allergies  Current Outpatient Prescriptions  Medication Sig Dispense Refill  . albuterol (VENTOLIN HFA) 108 (90 BASE) MCG/ACT inhaler Inhale 2 puffs into the lungs every 4 (four) hours as needed. For shortness of breath.      . aspirin EC 81 MG tablet Take 81 mg by mouth every morning.       . atorvastatin (LIPITOR) 20 MG tablet Take 20 mg by mouth daily.      . calcitRIOL (ROCALTROL) 0.25 MCG capsule Take 0.25 mcg by mouth daily.      .   carvedilol (COREG) 12.5 MG tablet Take 3.125 mg by mouth 2 (two) times daily with a meal.       . ferrous sulfate 325 (65 FE) MG tablet Take 325 mg by mouth daily with breakfast.      . furosemide (LASIX) 80 MG tablet 160 MG MG TWICE A DAY      . hydrALAZINE (APRESOLINE) 25 MG tablet Take 12.5 mg by mouth 3 (three) times daily.       . isosorbide mononitrate (IMDUR) 30 MG 24 hr tablet Take 1 tablet (30 mg total) by mouth daily.  30 tablet  6  . spironolactone (ALDACTONE) 25 MG tablet Take 25 mg by mouth daily.      . darbepoetin (ARANESP) 100 MCG/0.5ML SOLN Inject 100 mcg into the skin once.        No current facility-administered medications for this visit.    Physical Examination  Filed Vitals:   09/28/12 1327  BP: 177/88  Pulse: 74    Body mass index is 39.75 kg/(m^2).  General:  WDWN in NAD Gait: Normal HENT: WNL Eyes: Pupils equal Pulmonary: normal non-labored breathing ,  without Rales, rhonchi,  wheezing Cardiac: RRR, without  Murmurs, rubs or gallops; No carotid bruits Abdomen: soft, NT, Skin: no rashes, ulcers noted Vascular Exam/Pulses: 2+ radial and ulnar pulses palpable  Extremities without ischemic changes, no Gangrene , no cellulitis; no open wounds;  Musculoskeletal: no muscle wasting or atrophy  Neurologic: A&O X 3; Appropriate Affect ; SENSATION: normal; MOTOR FUNCTION:  moving all extremities equally. Speech is fluent/normal  Non-Invasive Vascular Imaging: 09/28/2012 Vein mapping shows left basilic vein > 3mm. All other cephalic and right BV are small  ASSESSMENT: Tim Herrera is a 77 y.o. male  with ESRD who may need HD in the next few months PLAN: Left BVT 10/09/12 Risks benefits and surgical procedure were explained to the pt. Signs and symptoms of steal syndrome also explained to the pt. Clinic MD: CE Fields, MD   History and exam details as above. The patient has a very small forearm cephalic veins bilaterally. His basilic vein in the left side seems like the most reasonable vein to consider a fistula. Multiple medical problems approaching the need for hemodialysis.  Left basilic vein transposition fistula on April 28. Risks benefits possible complications procedure details were explained the patient today including but not limited to bleeding infection non-maturation fistula ischemic steal. He understands and agrees to proceed. He will need general anesthesia.  Charles Fields, MD [facility] Office: [phone] Pager: [phone]  

## 2012-10-09 NOTE — Anesthesia Postprocedure Evaluation (Signed)
Anesthesia Post Note  Patient: Tim Herrera  Procedure(s) Performed: Procedure(s) (LRB): BASCILIC VEIN TRANSPOSITION (Left)  Anesthesia type: general  Patient location: PACU  Post pain: Pain level controlled  Post assessment: Patient's Cardiovascular Status Stable  Last Vitals:  Filed Vitals:   10/09/12 1515  BP: 127/67  Pulse: 83  Temp:   Resp:     Post vital signs: Reviewed and stable  Level of consciousness: sedated  Complications: No apparent anesthesia complications

## 2012-10-10 ENCOUNTER — Other Ambulatory Visit: Payer: Self-pay | Admitting: *Deleted

## 2012-10-10 ENCOUNTER — Encounter (HOSPITAL_COMMUNITY): Payer: Self-pay | Admitting: Vascular Surgery

## 2012-10-10 ENCOUNTER — Telehealth: Payer: Self-pay | Admitting: Vascular Surgery

## 2012-10-10 ENCOUNTER — Ambulatory Visit (HOSPITAL_COMMUNITY): Payer: Medicare Other | Attending: Internal Medicine

## 2012-10-10 DIAGNOSIS — I509 Heart failure, unspecified: Secondary | ICD-10-CM | POA: Insufficient documentation

## 2012-10-10 DIAGNOSIS — Z4931 Encounter for adequacy testing for hemodialysis: Secondary | ICD-10-CM

## 2012-10-10 DIAGNOSIS — N186 End stage renal disease: Secondary | ICD-10-CM

## 2012-10-10 NOTE — Telephone Encounter (Signed)
Spoke with patient, gave appt info - kf

## 2012-10-10 NOTE — Telephone Encounter (Signed)
Message copied by Margaretmary Eddy on Tue Oct 10, 2012  1:14 PM ------      Message from: Sharee Pimple      Created: Tue Oct 10, 2012  7:40 AM      Regarding: schedule                   ----- Message -----         From: Dara Lords, PA-C         Sent: 10/09/2012   1:32 PM           To: Sharee Pimple, CMA            S/p left BVT 10/09/12.  F/u with Dr. Darrick Penna in 4 weeks.            Thanks,      Lelon Mast ------

## 2012-10-12 ENCOUNTER — Encounter (HOSPITAL_COMMUNITY): Payer: Self-pay

## 2012-10-12 ENCOUNTER — Telehealth: Payer: Self-pay | Admitting: Internal Medicine

## 2012-10-12 ENCOUNTER — Inpatient Hospital Stay (HOSPITAL_COMMUNITY)
Admission: AD | Admit: 2012-10-12 | Discharge: 2012-10-14 | DRG: 981 | Disposition: A | Discharge status: Home / Self Care | Payer: Medicare Other | Source: Ambulatory Visit | Attending: Internal Medicine | Admitting: Internal Medicine

## 2012-10-12 DIAGNOSIS — L03114 Cellulitis of left upper limb: Secondary | ICD-10-CM | POA: Diagnosis present

## 2012-10-12 DIAGNOSIS — I10 Essential (primary) hypertension: Secondary | ICD-10-CM | POA: Diagnosis present

## 2012-10-12 DIAGNOSIS — N186 End stage renal disease: Secondary | ICD-10-CM | POA: Diagnosis present

## 2012-10-12 DIAGNOSIS — E66813 Obesity, class 3: Secondary | ICD-10-CM | POA: Diagnosis present

## 2012-10-12 DIAGNOSIS — I509 Heart failure, unspecified: Secondary | ICD-10-CM

## 2012-10-12 DIAGNOSIS — I2510 Atherosclerotic heart disease of native coronary artery without angina pectoris: Secondary | ICD-10-CM | POA: Diagnosis present

## 2012-10-12 DIAGNOSIS — N184 Chronic kidney disease, stage 4 (severe): Secondary | ICD-10-CM | POA: Diagnosis present

## 2012-10-12 DIAGNOSIS — IMO0002 Reserved for concepts with insufficient information to code with codable children: Principal | ICD-10-CM

## 2012-10-12 DIAGNOSIS — I5042 Chronic combined systolic (congestive) and diastolic (congestive) heart failure: Secondary | ICD-10-CM | POA: Diagnosis present

## 2012-10-12 DIAGNOSIS — Z8249 Family history of ischemic heart disease and other diseases of the circulatory system: Secondary | ICD-10-CM

## 2012-10-12 DIAGNOSIS — I5022 Chronic systolic (congestive) heart failure: Secondary | ICD-10-CM | POA: Diagnosis present

## 2012-10-12 DIAGNOSIS — D649 Anemia, unspecified: Secondary | ICD-10-CM | POA: Diagnosis present

## 2012-10-12 DIAGNOSIS — K7689 Other specified diseases of liver: Secondary | ICD-10-CM | POA: Diagnosis present

## 2012-10-12 DIAGNOSIS — N1330 Unspecified hydronephrosis: Secondary | ICD-10-CM | POA: Diagnosis present

## 2012-10-12 DIAGNOSIS — I120 Hypertensive chronic kidney disease with stage 5 chronic kidney disease or end stage renal disease: Secondary | ICD-10-CM | POA: Diagnosis present

## 2012-10-12 DIAGNOSIS — Z79899 Other long term (current) drug therapy: Secondary | ICD-10-CM

## 2012-10-12 DIAGNOSIS — Z6841 Body Mass Index (BMI) 40.0 and over, adult: Secondary | ICD-10-CM

## 2012-10-12 DIAGNOSIS — E119 Type 2 diabetes mellitus without complications: Secondary | ICD-10-CM | POA: Diagnosis present

## 2012-10-12 LAB — CBC
HCT: 31.8 % — ABNORMAL LOW (ref 39.0–52.0)
MCV: 85.7 fL (ref 78.0–100.0)
RBC: 3.71 MIL/uL — ABNORMAL LOW (ref 4.22–5.81)
RDW: 14.9 % (ref 11.5–15.5)
WBC: 4.8 10*3/uL (ref 4.0–10.5)

## 2012-10-12 LAB — CBC WITH DIFFERENTIAL/PLATELET
HCT: 29.5 % — ABNORMAL LOW (ref 39.0–52.0)
Hemoglobin: 9.7 g/dL — ABNORMAL LOW (ref 13.0–17.0)
Lymphocytes Relative: 16 % (ref 12–46)
MCHC: 32.9 g/dL (ref 30.0–36.0)
Monocytes Absolute: 0.7 10*3/uL (ref 0.1–1.0)
Monocytes Relative: 15 % — ABNORMAL HIGH (ref 3–12)
Neutro Abs: 2.7 10*3/uL (ref 1.7–7.7)
WBC: 4.4 10*3/uL (ref 4.0–10.5)

## 2012-10-12 LAB — COMPREHENSIVE METABOLIC PANEL
ALT: 6 U/L (ref 0–53)
AST: 11 U/L (ref 0–37)
CO2: 21 mEq/L (ref 19–32)
Chloride: 105 mEq/L (ref 96–112)
Creatinine, Ser: 2.98 mg/dL — ABNORMAL HIGH (ref 0.50–1.35)
GFR calc Af Amer: 22 mL/min — ABNORMAL LOW (ref >90)
GFR calc non Af Amer: 19 mL/min — ABNORMAL LOW (ref >90)
Glucose, Bld: 92 mg/dL (ref 70–99)
Sodium: 139 mEq/L (ref 135–145)
Total Bilirubin: 0.5 mg/dL (ref 0.3–1.2)

## 2012-10-12 LAB — BASIC METABOLIC PANEL
BUN: 53 mg/dL — ABNORMAL HIGH (ref 6–23)
CO2: 22 mEq/L (ref 19–32)
Chloride: 106 mEq/L (ref 96–112)
GFR calc Af Amer: 22 mL/min — ABNORMAL LOW (ref >90)
Potassium: 4.3 mEq/L (ref 3.5–5.1)

## 2012-10-12 LAB — APTT: aPTT: 37 seconds (ref 24–37)

## 2012-10-12 MED ORDER — HYDRALAZINE HCL 25 MG PO TABS
12.5000 mg | ORAL_TABLET | Freq: Three times a day (TID) | ORAL | Status: DC
  Administered 2012-10-12 – 2012-10-14 (×5): 12.5 mg via ORAL
  Filled 2012-10-12 (×7): qty 0.5

## 2012-10-12 MED ORDER — PIPERACILLIN-TAZOBACTAM 3.375 G IVPB
3.3750 g | Freq: Three times a day (TID) | INTRAVENOUS | Status: DC
  Administered 2012-10-12 – 2012-10-14 (×6): 3.375 g via INTRAVENOUS
  Filled 2012-10-12 (×8): qty 50

## 2012-10-12 MED ORDER — SPIRONOLACTONE 25 MG PO TABS
25.0000 mg | ORAL_TABLET | Freq: Every day | ORAL | Status: DC
  Administered 2012-10-12 – 2012-10-14 (×3): 25 mg via ORAL
  Filled 2012-10-12 (×3): qty 1

## 2012-10-12 MED ORDER — ONDANSETRON HCL 4 MG/2ML IJ SOLN: 4.0000 mg | Freq: Four times a day (QID) | INTRAMUSCULAR | Status: DC | PRN

## 2012-10-12 MED ORDER — ONDANSETRON HCL 4 MG PO TABS: 4.0000 mg | ORAL_TABLET | Freq: Four times a day (QID) | ORAL | Status: DC | PRN

## 2012-10-12 MED ORDER — ACETAMINOPHEN 325 MG PO TABS: 650.0000 mg | ORAL_TABLET | Freq: Four times a day (QID) | ORAL | Status: DC | PRN

## 2012-10-12 MED ORDER — CALCITRIOL 0.25 MCG PO CAPS
0.2500 ug | ORAL_CAPSULE | Freq: Every day | ORAL | Status: DC
  Administered 2012-10-12 – 2012-10-14 (×3): 0.25 ug via ORAL
  Filled 2012-10-12 (×3): qty 1

## 2012-10-12 MED ORDER — ALBUTEROL SULFATE HFA 108 (90 BASE) MCG/ACT IN AERS
2.0000 | INHALATION_SPRAY | RESPIRATORY_TRACT | Status: DC | PRN
  Filled 2012-10-12: qty 6.7

## 2012-10-12 MED ORDER — VANCOMYCIN HCL 10 G IV SOLR
2000.0000 mg | Freq: Once | INTRAVENOUS | Status: AC
  Administered 2012-10-12: 2000 mg via INTRAVENOUS
  Filled 2012-10-12: qty 2000

## 2012-10-12 MED ORDER — CARVEDILOL 12.5 MG PO TABS
12.5000 mg | ORAL_TABLET | Freq: Two times a day (BID) | ORAL | Status: DC
  Administered 2012-10-13 – 2012-10-14 (×3): 12.5 mg via ORAL
  Filled 2012-10-12 (×5): qty 1

## 2012-10-12 MED ORDER — ACETAMINOPHEN 650 MG RE SUPP: 650.0000 mg | Freq: Four times a day (QID) | RECTAL | Status: DC | PRN

## 2012-10-12 MED ORDER — SODIUM CHLORIDE 0.9 % IV SOLN
INTRAVENOUS | Status: DC
  Administered 2012-10-12: 19:00:00 via INTRAVENOUS

## 2012-10-12 MED ORDER — SODIUM CHLORIDE 0.9 % IV SOLN
1750.0000 mg | INTRAVENOUS | Status: DC
  Filled 2012-10-12: qty 1750

## 2012-10-12 MED ORDER — FUROSEMIDE 80 MG PO TABS
80.0000 mg | ORAL_TABLET | Freq: Two times a day (BID) | ORAL | Status: DC
  Administered 2012-10-12 – 2012-10-14 (×4): 80 mg via ORAL
  Filled 2012-10-12 (×6): qty 1

## 2012-10-12 MED ORDER — MORPHINE SULFATE 2 MG/ML IJ SOLN: 2.0000 mg | INTRAMUSCULAR | Status: DC | PRN

## 2012-10-12 MED ORDER — OXYCODONE HCL 5 MG PO TABS: 5.0000 mg | ORAL_TABLET | ORAL | Status: DC | PRN

## 2012-10-12 NOTE — Progress Notes (Signed)
Received patient from a doctor's clinic with complaint of swollen left arm.  Vital signs and physical assessment done and recorded.  MDs notified of the patient's arrival on the unit.  Waiting for orders.

## 2012-10-12 NOTE — Progress Notes (Addendum)
ANTIBIOTIC CONSULT NOTE - INITIAL  Pharmacy Consult for Vancomycin, Zosyn Indication: arm cellulitis  No Known Allergies  Patient Measurements: Height: 5\' 10"  (177.8 cm) Weight: 279 lb (126.554 kg) IBW/kg (Calculated) : 73 ABW: 89 Kg   Microbiology: Recent Results (from the past 720 hour(s))  SURGICAL PCR SCREEN     Status: None   Collection Time    10/05/12 12:58 PM      Result Value Range Status   MRSA, PCR NEGATIVE  NEGATIVE Final   Staphylococcus aureus NEGATIVE  NEGATIVE Final   Comment:            The Xpert SA Assay (FDA     approved for NASAL specimens     in patients over 28 years of age),     is one component of     a comprehensive surveillance     program.  Test performance has     been validated by [employer] for patients greater     than or equal to 60 year old.     It is not intended     to diagnose infection nor to     guide or monitor treatment.    Medical History: Past Medical History  Diagnosis Date  . Hypertension   . CAD (coronary artery disease)     Cardiac cath May 2012 with mild plaque LAD and Circumflex and severe disease in  very small PDA branch of RCA.   Marland Kitchen Anemia   . Fatty liver disease, nonalcoholic   . Renal cyst   . Morbid obesity   . Chronic kidney disease (CKD), stage IV (severe) 01/26/2012    Followed by Dr Abel Presto at [facility].  R handed. Hx R hydro in Nov 2103 treated with R ureteral stent (UPJ obstruction) but creatinine did not improve (high 2's).     . Acute on chronic combined systolic and diastolic CHF (congestive heart failure) 04/09/2012    Last EF in Jun 2013 was 25-30%   . Diabetes mellitus     prediabetic - 80-90s   Assessment: 77 year old male with past medical history of CHF, stage IV CKD, and hypertension.  Recently, he had a left brachial artery to basilic vein AV fistula done 9/56/21.  Presents today with redness, swelling, and pain to upper extremity.  Scr = 2.92 (10/05/12) / CrCl approximately 29 ml /  min  Goal of Therapy:  Vancomycin trough level 10-15 mcg/ml Appropriate Zosyn dosing  Plan:  1) Zosyn 3.375 grams iv Q 8 hours -- 4 hr infusion 2) Vancomycin 2000 mg iv x 1 dose now, then 1750 mg iv Q 48 hours 3) Follow up renal function, plan, cultures.  Thank you. Okey Regal, PharmD  10/12/2012,6:09 PM

## 2012-10-12 NOTE — Telephone Encounter (Signed)
PENDING ACCEPTANCE TRANFER NOTE:  Call received from:    Dr. Hal Hope at [facility]  REASON FOR REQUESTING TRANSFER:    Infected HD AV graft  HPI:   77 year old AAM with past medical history of chronic systolic CHF, stage IV CKD and hypertension. Had recent left brachial artery to basilic vein AV fistula done by Dr. Darrick Penna on 10/09/2012. Patient wants to his primary care physician today complaining about redness, swelling and pain as well as tenderness. Dr. Darrick Penna called and he will see the patient as consultation. Patient sees Dr. Arrie Aran    PLAN:  According to telephone report, this patient was accepted for transfer to [facility],   Under [facility] team:  10,  I have requested an order be written to call Flow Manager at [phone] upon patient arrival to the floor for final physician assignment who will do the admission and give admitting orders.  SIGNED: Clint Lipps, MD [facility]  10/12/2012, 12:49 PM

## 2012-10-12 NOTE — H&P (Signed)
[facility] History and Physical  Tim Herrera ZOX:[id number] DOB: Jun 22, 1935 DOA: 10/12/2012  Referring physician: Dr. Hal Hope. PCP: Tim Lightning, MD  Specialists: vascular surgeon, Dr. Leonette Most fields.  Chief Complaint:swollen and red left arm.  HPI: Tim Herrera is a 77 y.o. male with a history significant for coronary artery disease, now diet controlled type 2 diabetes mellitus, and stage IV chronic kidney disease, who presents as a direct admission for a chief complaint of swelling and redness of the left upper extremity. Accordingly, the patient underwent left vein transposition fistula by Dr. Darrick Herrera on 10/09/2012 in anticipation that the patient will eventually need hemodialysis. Three days ago, the patient noticed that there was some serous drainage oozing from the incision site toward the proximal part of his arm. Over the last couple days, his arm has become more erythematous and significantly more edematous.he denies outright purulent drainage. He has "soreness" but denies outright left arm pain or pain in his left hand. (He is right-handed.) He denies fever, chills, nausea, vomiting, or diarrhea. Dr. Darrick Herrera has been notified. His PA has already seen the patient.     Review of Systems:Positive as above in history present illness. Also, he has chronic lower extremity edema which has improved recently. Otherwise review of systems is negative.  Past Medical History  Diagnosis Date  . Hypertension   . CAD (coronary artery disease)     Cardiac cath May 2012 with mild plaque LAD and Circumflex and severe disease in  very small PDA branch of RCA.   Marland Kitchen Anemia   . Fatty liver disease, nonalcoholic   . Renal cyst   . Morbid obesity   . Chronic kidney disease (CKD), stage IV (severe) 01/26/2012    Followed by Dr Abel Presto at [facility].  R handed. Hx R hydro in Nov 2103 treated with R ureteral stent (UPJ obstruction) but creatinine did not improve (high 2's).     . Acute on chronic combined  systolic and diastolic CHF (congestive heart failure) 04/09/2012    Last EF in Jun 2013 was 25-30%   . Diabetes mellitus     prediabetic - 80-90s   Past Surgical History  Procedure Laterality Date  . Cystoscopy w/ ureteral stent placement  04/15/2012    Procedure: CYSTOSCOPY WITH RETROGRADE PYELOGRAM/URETERAL STENT PLACEMENT;  Surgeon: Tim Cage, MD;  Location: [facility]S;  Service: Urology;  Laterality: Right;  Cysto/Right Retrograde Pyelogram/Right Ureteral Stent  . Tonsillectomy      as child  . Bascilic vein transposition Left 10/09/2012    Procedure: BASCILIC VEIN TRANSPOSITION;  Surgeon: Tim Kerns, MD;  Location: [facility] OR;  Service: Vascular;  Laterality: Left;  Ultrasound guided   Social History: the patient is married. He is semi-retired. He denies tobacco, alcohol, and illicit drug use. reports that he has never smoked. He has never used smokeless tobacco. He reports that he does not drink alcohol or use illicit drugs.   No Known Allergies  Family History  Problem Relation Age of Onset  . Hypertension Mother   . Heart attack Brother   His father died of old age at 83.  Prior to Admission medications   Medication Sig Start Date End Date Taking? Authorizing Provider  albuterol (VENTOLIN HFA) 108 (90 BASE) MCG/ACT inhaler Inhale 2 puffs into the lungs every 4 (four) hours as needed. For shortness of breath.   Yes Historical Provider, MD  aspirin EC 81 MG tablet Take 81 mg by mouth every morning.    Yes Historical Provider,  MD  calcitRIOL (ROCALTROL) 0.25 MCG capsule Take 0.25 mcg by mouth daily.   Yes Historical Provider, MD  carvedilol (COREG) 12.5 MG tablet Take 12.5 mg by mouth 2 (two) times daily with a meal.    Yes Historical Provider, MD  ferrous sulfate 325 (65 FE) MG tablet Take 325 mg by mouth daily with breakfast.   Yes Historical Provider, MD  furosemide (LASIX) 40 MG tablet Take 80 mg by mouth 2 (two) times daily.   Yes Historical Provider, MD   hydrALAZINE (APRESOLINE) 25 MG tablet Take 12.5 mg by mouth 3 (three) times daily.    Yes Historical Provider, MD  oxyCODONE (OXY IR/ROXICODONE) 5 MG immediate release tablet Take 1 tablet (5 mg total) by mouth every 6 (six) hours as needed for pain. 10/09/12  Yes Samantha J Rhyne, PA-C  spironolactone (ALDACTONE) 25 MG tablet Take 25 mg by mouth daily.   Yes Historical Provider, MD   Physical Exam: Filed Vitals:   10/12/12 1523 10/12/12 1602  BP: 153/68   Pulse: 85 85  Temp: 98.1 F (36.7 C) 98.1 F (36.7 C)  TempSrc: Oral Oral  Resp: 16   Height: 5\' 10"  (1.778 m) 5\' 10"  (1.778 m)  Weight: 126.554 kg (279 lb) 126.554 kg (279 lb)  SpO2: 99% 99%     General:  Pleasant alert large framed 77 year old African/American man who is laying in bed, in no acute distress.  Eyes: pupils equal, round, reactive to light. Extraocular movements are intact. Conjunctivae are clear.  [id number] reveals mildly dry mucous membranes. No posterior exudates or edema.  Neck: neck is supple, no adenopathy, no thyromegaly, no JVD.  Cardiovascular: S1, S2, with a soft systolic murmur.  Respiratory: clear anteriorly. Breathing nonlabored.  Abdomen: mildly obese, positive bowel sounds, soft, nontender, nondistended.  Skin: scaliness over both distal legs.  Musculoskeletal: no acute hot red joints. Left arm reveals 4-5+ edema and mild erythema over the proximal arm over the incision sites. Small amount of serous drainage noted. Mildly tender to palpation. The patient is able to flex and extend his hand, he has a mild left hand grip. Radial pulses intact.  Psychiatric: he is alert and oriented x3. His affect is flat, but he is cooporative and pleasant..  Neurologic: cranial nerves II through XII are intact. His strength is 5 over 5 throughout with exception of the left arm which was not tested for strength necessarily. Sensation is grossly intact.  Labs on Admission:  Basic Metabolic Panel:  Recent  Labs Lab 10/09/12 0739  NA 144  K 3.8  GLUCOSE 89   Liver Function Tests: No results found for this basename: AST, ALT, ALKPHOS, BILITOT, PROT, ALBUMIN,  in the last 168 hours No results found for this basename: LIPASE, AMYLASE,  in the last 168 hours No results found for this basename: AMMONIA,  in the last 168 hours CBC:  Recent Labs Lab 10/09/12 0739  HGB 11.6*  HCT 34.0*   Cardiac Enzymes: No results found for this basename: CKTOTAL, CKMB, CKMBINDEX, TROPONINI,  in the last 168 hours  BNP (last 3 results)  Recent Labs  04/09/12 1146 07/10/12 1330 09/03/12 1538  PROBNP 14183.0* 12233.0* 25299.0*   CBG:  Recent Labs Lab 10/09/12 0958 10/09/12 1400 10/09/12 1522  GLUCAP 87 90 111*    Radiological Exams on Admission: No results found.  EKG: not ordered.  Assessment/Plan Principal Problem:   Cellulitis of left arm Active Problems:   HTN (hypertension)   CAD (coronary artery disease)  Chronic kidney disease (CKD), stage IV (severe)   DM (diabetes mellitus)   Anemia   Obesity, Class III, BMI 40-49.9 (morbid obesity)   Hydronephrosis of right kidney   Chronic combined systolic and diastolic CHF (congestive heart failure)   1. This is a 77 year old man who is being directly admitted for cellulitis of the left arm, status post left basilic vein transposition fistula by Dr. Darrick Herrera on 10/09/2012. He does not appear to be septic or toxic. Nevertheless, broad-spectrum antibiotic therapy will be ordered with vancomycin and Zosyn. Antibiotic therapy can be narrowed quickly over the next 24-48 hours accordingly. He is hemodynamically stable and afebrile. All of his chronic medical conditions appear to be stable. However, laboratory studies have been ordered and the results are currently pending. On 10/06/2012, his creatinine was 2.92 and his hemoglobin was 11.6. His diabetes is now treated with diet alone.   Plan: 1. Dr. Darrick Herrera has been consulted and is aware of the  patient. 2. We'll start broad-spectrum antibiotic therapy with vancomycin and Zosyn. 3. We'll continue all of his chronic medications as previously prescribed. Supportive treatment and analgesics as needed will be ordered. 4. We'll check his capillary blood glucose twice a day. If they are consistently above 150, we'll start sliding scale NovoLog. 5. We'll consult nephrology non-emergently in the morning. 6. We'll check/order admission laboratory studies. 7. Of note, will hold aspirin and DVT prophylaxis with heparin or Lovenox just in case there may be surgical intervention. If not, aspirin can be restarted tomorrow.  Code Status: full code Family Communication: discussed with his wife. Disposition Plan: to be determined.  Time spent: one hour.  [facility] Pager [phone]  If 7PM-7AM, please contact night-coverage [email or web address] Password [email or web address] 10/12/2012, 6:18 PM

## 2012-10-12 NOTE — Progress Notes (Addendum)
[facility] [place]   Subjective  - Tim Herrera is a 77 y.o. Male who underwent Left basilic vein transposition fistula.  He presents today with sever edema and erythema to the left upper extremity.  He has decreased sensation in the left hand, but no signs of steal.  He has no history of pace maker implant or diatek catheter placement on the left.             Past Medical History   Diagnosis  Date   .  Hypertension    .  Diabetes mellitus    .  CAD (coronary artery disease)      Cardiac cath May 2012 with mild plaque LAD and Circumflex and severe disease in very small PDA branch of RCA.   Marland Kitchen  Anemia    .  Fatty liver disease, nonalcoholic    .  Renal cyst    .  Morbid obesity    .  Chronic kidney disease (CKD), stage IV (severe)  01/26/2012     Followed by Dr Abel Presto at [facility]. R handed. Hx R hydro in Nov 2103 treated with R ureteral stent (UPJ obstruction) but creatinine did not improve (high 2's).   .  Acute on chronic combined systolic and diastolic CHF (congestive heart failure)  04/09/2012     Last EF in Jun 2013 was 25-30%    ROS: [x]  Positive [ ]  Denies  General: [ ]  Weight loss,[x]  weight gain [x ] Fever, [ ]  chills  Neurologic: [ ]  Dizziness, [ ]  Blackouts, [ ]  Seizure  [ ]  Stroke, [ ]  "Mini stroke", [ ]  Slurred speech, [ ]  Temporary blindness; [ ]  weakness in arms or legs, [ ]  Hoarseness  Cardiac: [ ]  Chest pain/pressure, [ ]  Shortness of breath at rest [ ]  Shortness of breath with exertion, [ ]  Atrial fibrillation or irregular heartbeat  Vascular: [ ]  Pain in legs with walking, [ ]  Pain in legs at rest, [ ]  Pain in legs at night,  [ ]  Non-healing ulcer, [ ]  Blood clot in vein/DVT,  Pulmonary: [ ]  Home oxygen, [ ]  Productive cough, [ ]  Coughing up blood, [x ] Asthma,  [ ]  Wheezing  Musculoskeletal: [ ]  Arthritis, [ ]  Low back pain, [ ]  Joint pain  Hematologic: [ ]  Easy Bruising, [ ]  Anemia; [ ]  Hepatitis  Gastrointestinal: [ ]  Blood in stool, [ ]   Gastroesophageal Reflux/heartburn, [ ]  Trouble swallowing  Urinary: [x ] chronic Kidney disease, [ ]  on HD - [ ]  MWF or [ ]  TTHS, [ ]  Burning with urination, [ ]  Difficulty urinating  Skin: [ ]  Rashes, [ ]  Wounds  Psychological: [ ]  Anxiety, [ ]  Depression  Social History  History   Substance Use Topics   .  Smoking status:  Never Smoker   .  Smokeless tobacco:  Never Used   .  Alcohol Use:  No    Family History  Family History   Problem  Relation  Age of Onset   .  Hypertension  Mother    .  Heart attack  Brother     No Known Allergies  Current Outpatient Prescriptions   Medication  Sig  Dispense  Refill   .  albuterol (VENTOLIN HFA) 108 (90 BASE) MCG/ACT inhaler  Inhale 2 puffs into the lungs every 4 (four) hours as needed. For shortness of breath.     Marland Kitchen  aspirin EC 81 MG tablet  Take 81 mg by  mouth every morning.     Marland Kitchen  atorvastatin (LIPITOR) 20 MG tablet  Take 20 mg by mouth daily.     .  calcitRIOL (ROCALTROL) 0.25 MCG capsule  Take 0.25 mcg by mouth daily.     .  carvedilol (COREG) 12.5 MG tablet  Take 3.125 mg by mouth 2 (two) times daily with a meal.     .  ferrous sulfate 325 (65 FE) MG tablet  Take 325 mg by mouth daily with breakfast.     .  furosemide (LASIX) 80 MG tablet  160 MG MG TWICE A DAY     .  hydrALAZINE (APRESOLINE) 25 MG tablet  Take 12.5 mg by mouth 3 (three) times daily.     .  isosorbide mononitrate (IMDUR) 30 MG 24 hr tablet  Take 1 tablet (30 mg total) by mouth daily.  30 tablet  6   .  spironolactone (ALDACTONE) 25 MG tablet  Take 25 mg by mouth daily.     .  darbepoetin (ARANESP) 100 MCG/0.5ML SOLN  Inject 100 mcg into the skin once.        PE:  Alert and oriented no acute distress Gait: Normal  HENT: WNL  Eyes: Pupils equal  Pulmonary: normal non-labored breathing , without Rales, rhonchi, wheezing  Cardiac: RRR, without Murmurs, rubs or gallops;  No carotid bruits  Abdomen: soft, NT,  Skin: no rashes, ulcers noted.  Incisional weeping clear  fluid left upper arm.  Vascular Exam/Pulses: 2+ radial and ulnar pulses palpable.  Left arm sever edema with incision erythema. Extremities without ischemic changes, no Gangrene , no cellulitis; no open wounds;  Musculoskeletal: no muscle wasting or atrophy  Neurologic: A&O X 3; Appropriate Affect ; SENSATION: normal; MOTOR FUNCTION: moving all extremities equally. Speech is fluent/normal     Assessment/Planning: Left basilic vein transposition fistula with sever left upper extremity edema He is afebrile I elevated his arm above his heart with pillows in the supine position CBC, CMET will be ordered. I will discuss further work up with Dr. Darrick Penna.    Clinton Gallant [employer] 10/12/2012 3:20 PM --  Laboratory Lab Results: No results found for this basename: WBC, HGB, HCT, PLT,  in the last 72 hours BMET No results found for this basename: NA, K, CL, CO2, GLUCOSE, BUN, CREATININE, CALCIUM,  in the last 72 hours  COAG Lab Results  Component Value Date   INR 1.13 04/11/2012   INR 1.09 04/09/2012   INR 1.2* 10/27/2010   No results found for this basename: PTT      History and exam as above Diffusely swollen arm with some erythema Most likely this is some element of venous reflux or hypertension due to disrupted veins when mobilizing basilic vein. Infection less likely.  Hand is warm with palpable radial pulse.  Does not really seem like steal and hand symptoms more related to edema.  Will keep arm elevated and observe for now.  If edema worsens will need ligation of fistula.  Hopefully this will improve with elevation over the next few days. Continue dry dressings and Kerlix over incisions. Please avoid any anticoagulants even for DVT prophylaxis to reduce risk of bleeding which would exacerbate problem. SCDs are fine  Fabienne Bruns, MD [facility] [place] Office: [phone] Pager: [phone]

## 2012-10-13 ENCOUNTER — Ambulatory Visit: Payer: Medicare Other | Admitting: Nurse Practitioner

## 2012-10-13 LAB — BASIC METABOLIC PANEL
BUN: 51 mg/dL — ABNORMAL HIGH (ref 6–23)
CO2: 22 mEq/L (ref 19–32)
Calcium: 8.7 mg/dL (ref 8.4–10.5)
GFR calc non Af Amer: 19 mL/min — ABNORMAL LOW (ref >90)
Glucose, Bld: 97 mg/dL (ref 70–99)
Sodium: 139 mEq/L (ref 135–145)

## 2012-10-13 LAB — CBC
HCT: 29.6 % — ABNORMAL LOW (ref 39.0–52.0)
Hemoglobin: 9.9 g/dL — ABNORMAL LOW (ref 13.0–17.0)
MCH: 28.2 pg (ref 26.0–34.0)
MCHC: 33.4 g/dL (ref 30.0–36.0)
MCV: 84.3 fL (ref 78.0–100.0)
RBC: 3.51 MIL/uL — ABNORMAL LOW (ref 4.22–5.81)

## 2012-10-13 LAB — TSH: TSH: 4.01 u[IU]/mL (ref 0.350–4.500)

## 2012-10-13 NOTE — Progress Notes (Addendum)
[facility] [place]  Subjective  - Decreased swelling this am.     Objective 167/76 78 98.2 F (36.8 C) (Oral) 18 100%  Intake/Output Summary (Last 24 hours) at 10/13/12 1017 Last data filed at 10/13/12 0600  Gross per 24 hour  Intake    220 ml  Output    950 ml  Net   -730 ml    Thrill palpable left upper arm. Hand decreased edema and increased active range of motion  Assessment/Planning: Left basilic vein transposition fistula with sever left upper extremity edema He continues to be afebrile. Elevation encouraged.     Clinton Gallant [facility] 10/13/2012 10:17 AM --  Laboratory Lab Results:  Recent Labs  10/12/12 1846 10/13/12 0545  WBC 4.4 3.9*  HGB 9.7* 9.9*  HCT 29.5* 29.6*  PLT 182 185   BMET  Recent Labs  10/12/12 1846 10/13/12 0545  NA 139 139  K 4.4 3.9  CL 105 102  CO2 21 22  GLUCOSE 92 97  BUN 51* 51*  CREATININE 2.98* 3.01*  CALCIUM 8.7 8.7    COAG Lab Results  Component Value Date   INR 1.09 10/12/2012   INR 1.13 04/11/2012   INR 1.09 04/09/2012   No results found for this basename: PTT    Overall improving.  Would keep inpatient over the weekend to make sure no wound breakdown.  Continue arm elevation.  Fabienne Bruns, MD [facility] [place] Office: [phone] Pager: [phone]

## 2012-10-13 NOTE — Progress Notes (Signed)
Utilization Review Completed.   Lumina Gitto, RN, BSN Nurse Case Manager  [phone]  

## 2012-10-13 NOTE — Progress Notes (Signed)
Nutrition Brief Note  Patient identified on the Malnutrition Screening Tool (MST) Report. Pt reports decreasing weight 2/2 fluid status. He notes that he takes fluid pills and attributes his weight loss to fluid loss. Reports "great" appetite. Denies any questions or concerns.  Body mass index is 40.03 kg/(m^2). Patient meets criteria for Obese Class III based on current BMI.   Current diet order is Heart Healthy, patient is consuming approximately 100% of meals at this time. Labs and medications reviewed.   No nutrition interventions warranted at this time. If nutrition issues arise, please consult RD.   Jarold Motto MS, RD, LDN Pager: [phone] After-hours pager: [phone]

## 2012-10-13 NOTE — Progress Notes (Signed)
Pharmacist Heart Failure Core Measure Documentation  Assessment: Tim Herrera has an EF documented as 35% on 09/04/12 by ECHO.  Rationale: Heart failure patients with left ventricular systolic dysfunction (LVSD) and an EF < 40% should be prescribed an angiotensin converting enzyme inhibitor (ACEI) or angiotensin receptor blocker (ARB) at discharge unless a contraindication is documented in the medical record.  This patient is not currently on an ACEI or ARB for HF.  This note is being placed in the record in order to provide documentation that a contraindication to the use of these agents is present for this encounter.  ACE Inhibitor or Angiotensin Receptor Blocker is contraindicated (specify all that apply)  []   ACEI allergy AND ARB allergy []   Angioedema []   Moderate or severe aortic stenosis []   Hyperkalemia []   Hypotension []   Renal artery stenosis [x]   Worsening renal function, preexisting renal disease or dysfunction  Tim Herrera, Pharm D 10/13/2012 10:39 AM

## 2012-10-13 NOTE — Progress Notes (Signed)
[facility] PROGRESS NOTE  Tim Herrera ZOX:[id number] DOB: [birth year] DOA: 10/12/2012 PCP: [name], MD  Assessment/Plan: Principal Problem:   Cellulitis of left arm Active Problems:   HTN (hypertension)   CAD (coronary artery disease)   Chronic kidney disease (CKD), stage IV (severe)   DM (diabetes mellitus)   Anemia   Obesity, Class III, BMI 40-49.9 (morbid obesity)   Hydronephrosis of right kidney   Chronic combined systolic and diastolic CHF (congestive heart failure)    Left basilic vein transposition fistula with sever left upper extremity edema Continue empiric antibiotics Arm elevation Vascular surgery consult Avoid heparin products/anticoagulants   Hypertension-continue Coreg-hydralazine-Spironolactone  Diabetes mellitus Hemoglobin A1c of 5.3  Chronic combined systolic and diastolic CHF (congestive heart failure-continue Lasix, Coreg, Aldactone, hydralazine   Code Status: full Family Communication: family updated about patient's clinical progress Disposition Plan:  As above    Brief narrative: Tim Herrera is a 77 y.o. male with a history significant for coronary artery disease, now diet controlled type 2 diabetes mellitus, and stage IV chronic kidney disease, who presents as a direct admission for a chief complaint of swelling and redness of the left upper extremity. Accordingly, the patient underwent left vein transposition fistula by Dr. Darrick Penna on 10/09/2012 in anticipation that the patient will eventually need hemodialysis. Three days ago, the patient noticed that there was some serous drainage oozing from the incision site toward the proximal part of his arm. Over the last couple days, his arm has become more erythematous and significantly more edematous.he denies outright purulent drainage. He has "soreness" but denies outright left arm pain or pain in his left hand. (He is right-handed.) He denies fever, chills, nausea, vomiting, or diarrhea. Dr.  Darrick Penna has been notified. His PA has already seen the patient.      Consultants:  Vascular   None  Antibiotics:  Vancomycin and Zosyn  HPI/Subjective: complaint of swollen left arm   Objective: Filed Vitals:   10/12/12 1523 10/12/12 1602 10/12/12 2128 10/13/12 0545  BP: 153/68  127/52 167/76  Pulse: 85 85 89 78  Temp: 98.1 F (36.7 C) 98.1 F (36.7 C) 98.8 F (37.1 C) 98.2 F (36.8 C)  TempSrc: Oral Oral Oral Oral  Resp: 16  18 18   Height: 5\' 10"  (1.778 m) 5\' 10"  (1.778 m)    Weight: 126.554 kg (279 lb) 126.554 kg (279 lb)    SpO2: 99% 99% 100% 100%    Intake/Output Summary (Last 24 hours) at 10/13/12 0851 Last data filed at 10/13/12 0600  Gross per 24 hour  Intake    220 ml  Output    950 ml  Net   -730 ml    Exam:  General: Pleasant alert large framed 77 year old African/American man who is laying in bed, in no acute distress.  Eyes: pupils equal, round, reactive to light. Extraocular movements are intact. Conjunctivae are clear.  WJX:BJYNWGNFAO reveals mildly dry mucous membranes. No posterior exudates or edema.  Neck: neck is supple, no adenopathy, no thyromegaly, no JVD.  Cardiovascular: S1, S2, with a soft systolic murmur.  Respiratory: clear anteriorly. Breathing nonlabored.  Abdomen: mildly obese, positive bowel sounds, soft, nontender, nondistended.  Skin: scaliness over both distal legs.  Musculoskeletal: no acute hot red joints. Left arm reveals 4-5+ edema and mild erythema over the proximal arm over the incision sites. Small amount of serous drainage noted. Mildly tender to palpation. The patient is able to flex and extend his hand, he has a mild left hand grip.  Radial pulses intact.  Psychiatric: he is alert and oriented x3. His affect is flat, but he is cooporative and pleasant..  Neurologic: cranial nerves II through XII are intact. His strength is 5 over 5 throughout with exception of the left arm which was not tested for strength necessarily.  Sensation is grossly intact.    Data Reviewed: Basic Metabolic Panel:  Recent Labs Lab 10/09/12 0739 10/12/12 1632 10/12/12 1846 10/13/12 0545  NA 144 141 139 139  K 3.8 4.3 4.4 3.9  CL  --  106 105 102  CO2  --  22 21 22   GLUCOSE 89 106* 92 97  BUN  --  53* 51* 51*  CREATININE  --  3.07* 2.98* 3.01*  CALCIUM  --  8.9 8.7 8.7  MG  --   --  1.8  --   PHOS  --   --  3.9  --     Liver Function Tests:  Recent Labs Lab 10/12/12 1846  AST 11  ALT 6  ALKPHOS 174*  BILITOT 0.5  PROT 6.7  ALBUMIN 3.1*   No results found for this basename: LIPASE, AMYLASE,  in the last 168 hours No results found for this basename: AMMONIA,  in the last 168 hours  CBC:  Recent Labs Lab 10/09/12 0739 10/12/12 1632 10/12/12 1846 10/13/12 0545  WBC  --  4.8 4.4 3.9*  NEUTROABS  --   --  2.7  --   HGB 11.6* 10.4* 9.7* 9.9*  HCT 34.0* 31.8* 29.5* 29.6*  MCV  --  85.7 84.8 84.3  PLT  --  177 182 185    Cardiac Enzymes: No results found for this basename: CKTOTAL, CKMB, CKMBINDEX, TROPONINI,  in the last 168 hours BNP (last 3 results)  Recent Labs  04/09/12 1146 07/10/12 1330 09/03/12 1538  PROBNP 14183.0* 12233.0* 25299.0*     CBG:  Recent Labs Lab 10/09/12 0958 10/09/12 1400 10/09/12 1522 10/13/12 0757  GLUCAP 87 90 111* 87    Recent Results (from the past 240 hour(s))  SURGICAL PCR SCREEN     Status: None   Collection Time    10/05/12 12:58 PM      Result Value Range Status   MRSA, PCR NEGATIVE  NEGATIVE Final   Staphylococcus aureus NEGATIVE  NEGATIVE Final   Comment:            The Xpert SA Assay (FDA     approved for NASAL specimens     in patients over 42 years of age),     is one component of     a comprehensive surveillance     program.  Test performance has     been validated by [employer] for patients greater     than or equal to 66 year old.     It is not intended     to diagnose infection nor to     guide or monitor treatment.      Studies: No results found.  Scheduled Meds: . calcitRIOL  0.25 mcg Oral Daily  . carvedilol  12.5 mg Oral BID WC  . furosemide  80 mg Oral BID  . hydrALAZINE  12.5 mg Oral TID  . piperacillin-tazobactam (ZOSYN)  IV  3.375 g Intravenous Q8H  . spironolactone  25 mg Oral Daily  . [START ON 10/14/2012] vancomycin  1,750 mg Intravenous Q48H   Continuous Infusions: . sodium chloride 10 mL/hr at 10/12/12 1856    Principal  Problem:   Cellulitis of left arm Active Problems:   HTN (hypertension)   CAD (coronary artery disease)   Chronic kidney disease (CKD), stage IV (severe)   DM (diabetes mellitus)   Anemia   Obesity, Class III, BMI 40-49.9 (morbid obesity)   Hydronephrosis of right kidney   Chronic combined systolic and diastolic CHF (congestive heart failure)    Time spent: 40 minutes   [facility]  [facility] Pager [phone]. If 8PM-8AM, please contact night-coverage at [email or web address], password [facility] 10/13/2012, 8:51 AM  LOS: 1 day

## 2012-10-14 LAB — BASIC METABOLIC PANEL
BUN: 52 mg/dL — ABNORMAL HIGH (ref 6–23)
Chloride: 103 mEq/L (ref 96–112)
Creatinine, Ser: 3.12 mg/dL — ABNORMAL HIGH (ref 0.50–1.35)
GFR calc non Af Amer: 18 mL/min — ABNORMAL LOW (ref >90)
Glucose, Bld: 100 mg/dL — ABNORMAL HIGH (ref 70–99)
Potassium: 4.4 mEq/L (ref 3.5–5.1)

## 2012-10-14 MED ORDER — AMOXICILLIN-POT CLAVULANATE 875-125 MG PO TABS: 1.0000 | ORAL_TABLET | Freq: Two times a day (BID) | ORAL | Status: DC

## 2012-10-14 NOTE — Progress Notes (Signed)
Patient was discharged home with wife. Patient was discharged with instructions and prescriptions. Patient was told about upcoming appointments. Patient was told of signs and symptoms to watch out for in his left arm site. Patient was told to call the doctor with questions and concerns. Patient was stable upon discharge.

## 2012-10-14 NOTE — Progress Notes (Signed)
[facility] Progress Note  10/14/2012 10:23 AM HD 2  Subjective:  Ready to go home.  Swelling is much better  Afebrile VSS  Filed Vitals:   10/14/12 0944  BP: 136/85  Pulse: 94  Temp: 97.5 F (36.4 C)  Resp: 18    Physical Exam:  Extremities:  There is a thrill in the left BVT.  Denies steal symptoms.  Swelling has improved.  CBC    Component Value Date/Time   WBC 3.9* 10/13/2012 0545   RBC 3.51* 10/13/2012 0545   HGB 9.9* 10/13/2012 0545   HCT 29.6* 10/13/2012 0545   PLT 185 10/13/2012 0545   MCV 84.3 10/13/2012 0545   MCH 28.2 10/13/2012 0545   MCHC 33.4 10/13/2012 0545   RDW 15.0 10/13/2012 0545   LYMPHSABS 0.7 10/12/2012 1846   MONOABS 0.7 10/12/2012 1846   EOSABS 0.3 10/12/2012 1846   BASOSABS 0.1 10/12/2012 1846    BMET    Component Value Date/Time   NA 140 10/14/2012 0805   K 4.4 10/14/2012 0805   CL 103 10/14/2012 0805   CO2 23 10/14/2012 0805   GLUCOSE 100* 10/14/2012 0805   BUN 52* 10/14/2012 0805   CREATININE 3.12* 10/14/2012 0805   CALCIUM 9.2 10/14/2012 0805   GFRNONAA 18* 10/14/2012 0805   GFRAA 21* 10/14/2012 0805    INR    Component Value Date/Time   INR 1.09 10/12/2012 1846     Intake/Output Summary (Last 24 hours) at 10/14/12 1023 Last data filed at 10/14/12 0900  Gross per 24 hour  Intake    700 ml  Output   1625 ml  Net   -925 ml     Assessment/Plan:  77 y.o. male is  S/p left BVT earlier this week readmitted with left arm swelling HD 2  -pt's swelling in left arm is significantly improved and pt wants to go home. -pt may have a central venous stenosis and possible intervention, but it is too soon after surgery to evaluate at this time.  May need this in the future if swelling is persistent. -f/u with Dr. Darrick Penna in 2 weeks-office will arrange his appt.   Doreatha Massed, PA-C [facility] [phone] 10/14/2012 10:23 AM

## 2012-10-14 NOTE — Progress Notes (Signed)
I agree with the above. The patient reports that the swelling in his arm is significantly better. He has much better motion of his hand and fingers. He continues to have a thrill within the fistula. He wishes to go home today. I think that would be appropriate. I will schedule him to followup with Dr. Darrick Penna in 2 weeks  Tim Herrera

## 2012-10-14 NOTE — Discharge Summary (Addendum)
[facility] PROGRESS NOTE  Tim Herrera WUJ:[id number] DOB: [birth year] DOA: 10/12/2012 PCP: [name], MD  Assessment/Plan: Principal Problem:   Cellulitis of left arm Active Problems:   HTN (hypertension)   CAD (coronary artery disease)   Chronic kidney disease (CKD), stage IV (severe)   DM (diabetes mellitus)   Anemia   Obesity, Class III, BMI 40-49.9 (morbid obesity)   Hydronephrosis of right kidney   Chronic combined systolic and diastolic CHF (congestive heart failure)      Medication List    TAKE these medications       amoxicillin-clavulanate 875-125 MG per tablet  Commonly known as:  AUGMENTIN  Take 1 tablet by mouth 2 (two) times daily.     aspirin EC 81 MG tablet  Take 81 mg by mouth every morning.     calcitRIOL 0.25 MCG capsule  Commonly known as:  ROCALTROL  Take 0.25 mcg by mouth daily.     carvedilol 12.5 MG tablet  Commonly known as:  COREG  Take 12.5 mg by mouth 2 (two) times daily with a meal.     ferrous sulfate 325 (65 FE) MG tablet  Take 325 mg by mouth daily with breakfast.     furosemide 40 MG tablet  Commonly known as:  LASIX  Take 80 mg by mouth 2 (two) times daily.     hydrALAZINE 25 MG tablet  Commonly known as:  APRESOLINE  Take 12.5 mg by mouth 3 (three) times daily.     oxyCODONE 5 MG immediate release tablet  Commonly known as:  Oxy IR/ROXICODONE  Take 1 tablet (5 mg total) by mouth every 6 (six) hours as needed for pain.     spironolactone 25 MG tablet  Commonly known as:  ALDACTONE  Take 25 mg by mouth daily.     VENTOLIN HFA 108 (90 BASE) MCG/ACT inhaler  Generic drug:  albuterol  Inhale 2 puffs into the lungs every 4 (four) hours as needed. For shortness of breath.        Brief narrative:  Tim Herrera is a 77 y.o. male with a history significant for coronary artery disease, now diet controlled type 2 diabetes mellitus, and stage IV chronic kidney disease, who presents as a direct admission for a chief  complaint of swelling and redness of the left upper extremity. Accordingly, the patient underwent left vein transposition fistula by Dr. Darrick Penna on 10/09/2012 in anticipation that the patient will eventually need hemodialysis. Three days ago, the patient noticed that there was some serous drainage oozing from the incision site toward the proximal part of his arm. Over the last couple days, his arm has become more erythematous and significantly more edematous.he denies outright purulent drainage. He has "soreness" but denies outright left arm pain or pain in his left hand. (He is right-handed.) He denies fever, chills, nausea, vomiting, or diarrhea. Dr. Darrick Penna has been notified. His PA has already seen the patient.       Left basilic vein transposition fistula with sever left upper extremity edema ,S/p left BVT earlier this week readmitted with left arm swelling  Continue empiric antibiotics ,augmentin orally Arm elevation  Vascular surgery consult -pt's swelling in left arm is significantly improved  pt may have a central venous stenosis and possible intervention, but it is too soon after surgery to evaluate at this time. May need this in the future if swelling is persistent.  -f/u with Dr. Darrick Penna in 2 weeks-office will arrange his appt.  Hypertension-continue Coreg-hydralazine-Spironolactone   Diabetes mellitus  Hemoglobin A1c of 5.3   Chronic combined systolic and diastolic CHF (congestive heart failure-continue Lasix, Coreg, Aldactone, hydralazine            Consultants:  Vascular None Antibiotics:  Vancomycin and Zosyn HPI/Subjective:  complaint of swollen left arm slightly improved     Objective: Filed Vitals:   10/13/12 1318 10/13/12 1757 10/13/12 2103 10/14/12 0501  BP: 141/62 176/82 153/70 161/74  Pulse: 70 83 72 76  Temp: 97.9 F (36.6 C) 97.8 F (36.6 C) 98.1 F (36.7 C) 97.9 F (36.6 C)  TempSrc: Oral Oral Oral Oral  Resp: 18 18 18 18   Height:       Weight:   126.009 kg (277 lb 12.8 oz)   SpO2: 98% 100% 100% 99%    Intake/Output Summary (Last 24 hours) at 10/14/12 0817 Last data filed at 10/14/12 0600  Gross per 24 hour  Intake    510 ml  Output   1625 ml  Net  -1115 ml    Exam:  General: Pleasant alert large framed 77 year old African/American man who is laying in bed, in no acute distress.  Eyes: pupils equal, round, reactive to light. Extraocular movements are intact. Conjunctivae are clear.  YNW:GNFAOZHYQM reveals mildly dry mucous membranes. No posterior exudates or edema.  Neck: neck is supple, no adenopathy, no thyromegaly, no JVD.  Cardiovascular: S1, S2, with a soft systolic murmur.  Respiratory: clear anteriorly. Breathing nonlabored.  Abdomen: mildly obese, positive bowel sounds, soft, nontender, nondistended.  Skin: scaliness over both distal legs.  Musculoskeletal: no acute hot red joints. Left arm reveals 4-5+ edema and mild erythema over the proximal arm over the incision sites. Small amount of serous drainage noted. Mildly tender to palpation. The patient is able to flex and extend his hand, he has a mild left hand grip. Radial pulses intact.  Psychiatric: he is alert and oriented x3. His affect is flat, but he is cooporative and pleasant..  Neurologic: cranial nerves II through XII are intact. His strength is 5 over 5 throughout with exception of the left arm which was not tested for strength necessarily. Sensation is grossly intact.    Data Reviewed: Basic Metabolic Panel:  Recent Labs Lab 10/09/12 0739 10/12/12 1632 10/12/12 1846 10/13/12 0545  NA 144 141 139 139  K 3.8 4.3 4.4 3.9  CL  --  106 105 102  CO2  --  22 21 22   GLUCOSE 89 106* 92 97  BUN  --  53* 51* 51*  CREATININE  --  3.07* 2.98* 3.01*  CALCIUM  --  8.9 8.7 8.7  MG  --   --  1.8  --   PHOS  --   --  3.9  --     Liver Function Tests:  Recent Labs Lab 10/12/12 1846  AST 11  ALT 6  ALKPHOS 174*  BILITOT 0.5  PROT 6.7   ALBUMIN 3.1*   No results found for this basename: LIPASE, AMYLASE,  in the last 168 hours No results found for this basename: AMMONIA,  in the last 168 hours  CBC:  Recent Labs Lab 10/09/12 0739 10/12/12 1632 10/12/12 1846 10/13/12 0545  WBC  --  4.8 4.4 3.9*  NEUTROABS  --   --  2.7  --   HGB 11.6* 10.4* 9.7* 9.9*  HCT 34.0* 31.8* 29.5* 29.6*  MCV  --  85.7 84.8 84.3  PLT  --  177 182 185  Cardiac Enzymes: No results found for this basename: CKTOTAL, CKMB, CKMBINDEX, TROPONINI,  in the last 168 hours BNP (last 3 results)  Recent Labs  04/09/12 1146 07/10/12 1330 09/03/12 1538  PROBNP 14183.0* 12233.0* 25299.0*     CBG:  Recent Labs Lab 10/09/12 1400 10/09/12 1522 10/13/12 0757 10/13/12 1744 10/14/12 0748  GLUCAP 90 111* 87 102* 93    Recent Results (from the past 240 hour(s))  SURGICAL PCR SCREEN     Status: None   Collection Time    10/05/12 12:58 PM      Result Value Range Status   MRSA, PCR NEGATIVE  NEGATIVE Final   Staphylococcus aureus NEGATIVE  NEGATIVE Final   Comment:            The Xpert SA Assay (FDA     approved for NASAL specimens     in patients over 41 years of age),     is one component of     a comprehensive surveillance     program.  Test performance has     been validated by [employer] for patients greater     than or equal to 22 year old.     It is not intended     to diagnose infection nor to     guide or monitor treatment.     Studies: No results found.  Scheduled Meds: . calcitRIOL  0.25 mcg Oral Daily  . carvedilol  12.5 mg Oral BID WC  . furosemide  80 mg Oral BID  . hydrALAZINE  12.5 mg Oral TID  . piperacillin-tazobactam (ZOSYN)  IV  3.375 g Intravenous Q8H  . spironolactone  25 mg Oral Daily  . vancomycin  1,750 mg Intravenous Q48H   Continuous Infusions: . sodium chloride 10 mL/hr at 10/12/12 1856    Principal Problem:   Cellulitis of left arm Active Problems:   HTN (hypertension)   CAD  (coronary artery disease)   Chronic kidney disease (CKD), stage IV (severe)   DM (diabetes mellitus)   Anemia   Obesity, Class III, BMI 40-49.9 (morbid obesity)   Hydronephrosis of right kidney   Chronic combined systolic and diastolic CHF (congestive heart failure)    Time spent: 40 minutes   [facility]  [facility] Pager [phone]. If 8PM-8AM, please contact night-coverage at [email or web address], password [facility] 10/14/2012, 8:17 AM  LOS: 2 days

## 2012-10-16 ENCOUNTER — Telehealth: Payer: Self-pay | Admitting: Vascular Surgery

## 2012-10-16 NOTE — Telephone Encounter (Addendum)
Message copied by Rosalyn Charters on Mon Oct 16, 2012 10:06 AM ------      Message from: [place], [place] K      Created: Mon Oct 16, 2012  7:14 AM      Regarding: schedule                   ----- Message -----         From: Dara Lords, PA-C         Sent: 10/14/2012  10:27 AM           To: Sharee Pimple, CMA            Admitted for LUE swelling s/p left BVT.  F/u with Dr. Darrick Penna in 2 weeks.            Thanks,      Lelon Mast ------  notified patient of fu appt. on 11-02-12 with cef

## 2012-10-18 ENCOUNTER — Other Ambulatory Visit (HOSPITAL_COMMUNITY): Payer: Self-pay | Admitting: *Deleted

## 2012-10-19 ENCOUNTER — Encounter (HOSPITAL_COMMUNITY): Payer: Medicare Other

## 2012-10-19 LAB — CULTURE, BLOOD (ROUTINE X 2): Culture: NO GROWTH

## 2012-10-25 ENCOUNTER — Encounter: Payer: Self-pay | Admitting: Physician Assistant

## 2012-10-25 ENCOUNTER — Ambulatory Visit (INDEPENDENT_AMBULATORY_CARE_PROVIDER_SITE_OTHER): Payer: Medicare Other | Admitting: Physician Assistant

## 2012-10-25 VITALS — BP 148/74 | HR 74 | Ht 70.0 in | Wt 286.0 lb

## 2012-10-25 DIAGNOSIS — IMO0002 Reserved for concepts with insufficient information to code with codable children: Secondary | ICD-10-CM

## 2012-10-25 DIAGNOSIS — I5042 Chronic combined systolic (congestive) and diastolic (congestive) heart failure: Secondary | ICD-10-CM

## 2012-10-25 DIAGNOSIS — I10 Essential (primary) hypertension: Secondary | ICD-10-CM

## 2012-10-25 DIAGNOSIS — L03114 Cellulitis of left upper limb: Secondary | ICD-10-CM

## 2012-10-25 DIAGNOSIS — N184 Chronic kidney disease, stage 4 (severe): Secondary | ICD-10-CM

## 2012-10-25 DIAGNOSIS — I2510 Atherosclerotic heart disease of native coronary artery without angina pectoris: Secondary | ICD-10-CM

## 2012-10-25 DIAGNOSIS — I509 Heart failure, unspecified: Secondary | ICD-10-CM

## 2012-10-25 NOTE — Progress Notes (Signed)
HPI:   77 yo AAM with history of HTN, non-ischemic cardiomyopathy, CAD, chronic systolic CHF, stage IV CKD who is here today for cardiac follow up. Tim Herrera was initially seen March 2012 for evaluation of an abnormal EKG and c/o lower extremity edema. Tim Herrera was started on Lasix by primary care and had considerable improvement in his lower ext edema. EKG showed NSR, Q waves III, AVF and poor R wave progression through the precordial leads. Echo 08/3010 showed mild LVH with moderate LV systolic dysfunction with LVEF of 30-35%. Stress myoview showed inferior wall scar with small area of possible apical ischemia. Cardiac cath 10/30/10 showed mild plaque in the LAD and Circumflex with severe disease in a very small PDA branch. We pursued medical management at that time. Repeat echo June 2013 with LVEF 25-30%. Tim Herrera was referred to see Dr. Graciela Husbands for consideration for ICD 01/06/12 but Tim Herrera felt that medications should be optimized before ICD would be considered. The patient had stopped his beta blocker. Coreg was started and aldactone was added. Tim Herrera was referred to the CHF clinic by Dr. Graciela Husbands and was seen in the CHF clinic twice in July and August 2013. Tim Herrera was admitted to [facility] 09/03/12 with volume overload, up over 40 lbs. Tim Herrera was diuresed with IV Lasix, guided by Nephrology given his baseline renal insufficiency. Recent worsening of renal function in setting of ureteral obstruction, ureteral stent has been placed. Tim Herrera has done well since discharge.   Tim Herrera recently underwent left basilic vein transposition fistula for impending dialysis. Tim Herrera presented to the emergency room with severe edema and erythema of the left upper extremity. It was felt Tim Herrera had a possible infection and was placed on antibiotics, but also an element of venous reflux or hypertension due to disrupted veins when mobilizing basilic vein. Has finished his antibiotics but his arm remained swollen Tim Herrera keeps it elevated but just in the time that it took for him to get  dressed and come to the office this morning his left hand and arm are significantly swollen. Tim Herrera is wearing a sling at times which seems to help keep it elevated and the swelling down. Tim Herrera sees Dr. Darrick Penna back next week.  Tim Herrera is here today for cardiac follow up. Tim Herrera denies chest pain, palpitations, dyspnea, dyspnea on exertion, dizziness, or presyncope. His weight is down 5 pounds from when Tim Herrera saw Dr. Clifton James in April 2014.   Primary Care Physician: [name]  No Known Allergies  Current Outpatient Prescriptions on File Prior to Visit: albuterol (VENTOLIN HFA) 108 (90 BASE) MCG/ACT inhaler, Inhale 2 puffs into the lungs every 4 (four) hours as needed. For shortness of breath., Disp: , Rfl:  aspirin EC 81 MG tablet, Take 81 mg by mouth every morning. , Disp: , Rfl:  calcitRIOL (ROCALTROL) 0.25 MCG capsule, Take 0.25 mcg by mouth daily., Disp: , Rfl:  carvedilol (COREG) 12.5 MG tablet, Take 12.5 mg by mouth 2 (two) times daily with a meal. , Disp: , Rfl:  ferrous sulfate 325 (65 FE) MG tablet, Take 325 mg by mouth daily with breakfast., Disp: , Rfl:  furosemide (LASIX) 40 MG tablet, Take 80 mg by mouth 2 (two) times daily., Disp: , Rfl:  hydrALAZINE (APRESOLINE) 25 MG tablet, Take 12.5 mg by mouth 3 (three) times daily. , Disp: , Rfl:  oxyCODONE (OXY IR/ROXICODONE) 5 MG immediate release tablet, Take 1 tablet (5 mg total) by mouth every 6 (six) hours as needed for pain., Disp: 30 tablet, Rfl: 0 spironolactone (ALDACTONE)  25 MG tablet, Take 25 mg by mouth daily., Disp: , Rfl:   No current facility-administered medications on file prior to visit.   Past Medical History:   Hypertension                                                 CAD (coronary artery disease)                                  Comment:Cardiac cath May 2012 with mild plaque LAD and               Circumflex and severe disease in  very small               PDA branch of RCA.    Anemia                                                        Fatty liver disease, nonalcoholic                            Renal cyst                                                   Morbid obesity                                               Chronic kidney disease (CKD), stage IV (severe) 01/26/2012      Comment:Followed by Dr Abel Presto at [facility].  R handed. Hx              R hydro in Nov 2103 treated with R ureteral               stent (UPJ obstruction) but creatinine did not               improve (high 2's).      Acute on chronic combined systolic and diastol* 04/09/2012     Comment:Last EF in Jun 2013 was 25-30%    Diabetes mellitus                                              Comment:prediabetic - 80-90s  Past Surgical History:   CYSTOSCOPY W/ URETERAL STENT PLACEMENT           04/15/2012      Comment:Procedure: CYSTOSCOPY WITH RETROGRADE               PYELOGRAM/URETERAL STENT PLACEMENT;  Surgeon:               Milford Cage, MD;  Location: [facility];  Service: Urology;  Laterality: Right;                Cysto/Right Retrograde Pyelogram/Right Ureteral              Stent   TONSILLECTOMY                                                   Comment:as child   BASCILIC VEIN TRANSPOSITION                     Left 10/09/2012      Comment:Procedure: BASCILIC VEIN TRANSPOSITION;                Surgeon: Sherren Kerns, MD;  Location: [facility]               OR;  Service: Vascular;  Laterality: Left;                Ultrasound guided  Review of patient's family history indicates:   Hypertension                   Mother                   Heart attack                   Brother                  Social History   Marital Status: Married             Spouse Name:                      Years of Education:                 Number of children: 1           Occupational History Occupation          Engineer, 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    ROS:See history of present illness otherwise negative   PHYSICAL EXAM: Obese, in no acute distress. Neck: Increased JVD, bilateral carotid Bruits, no or thyroid enlargement  Lungs: Decreased breath sounds throughout but No tachypnea, clear without wheezing, rales, or rhonchi  Cardiovascular: RRR, PMI not displaced,1-2/6 systolic murmur at the left border, no gallops, bruit, thrill, or heave.  Abdomen: BS normal. Soft without organomegaly, masses, lesions or tenderness.  Extremities: left arm is significantly swollen from the shoulder down to his fingertips, incision site of fistula is clean, lower extremity +1 chronic edema otherwise lower extremities without cyanosis, clubbing . Decreased distal pulses bilateral  SKin: Warm, no lesions or rashes   Musculoskeletal: No deformities  Neuro: no focal signs  There were no vitals taken for this visit.     Echo 09/04/12: Left ventricle: Endocardium not well seen. Overall appears to be moderate diffuse hypokinises possibly worse in septum and inferior wall The cavity size was mildly dilated. Wall thickness was increased in a pattern of mild LVH. The estimated ejection fraction was 35%. Diffuse hypokinesis. - Left atrium: The atrium was mildly dilated. - Right ventricle: The cavity size was mildly dilated. Wall thickness was normal.

## 2012-10-25 NOTE — Patient Instructions (Signed)
Your physician recommends that you schedule a follow-up appointment in: 1 month with Dr Clifton James   Your physician recommends that you continue on your current medications as directed. Please refer to the Current Medication list given to you today.

## 2012-10-25 NOTE — Assessment & Plan Note (Signed)
F/U with Dr. Abel Presto on Friday

## 2012-10-25 NOTE — Assessment & Plan Note (Addendum)
Patient has significant lymphedema of the left arm since his fistula was placed. His finished his round of antibiotics.There is no evidence of erythema or infection. I've asked him to continue to elevate his arm and he has an appointment to see Dr. Darrick Penna back next week.

## 2012-10-25 NOTE — Assessment & Plan Note (Signed)
Stable without complaints of chest pain.

## 2012-10-25 NOTE — Assessment & Plan Note (Signed)
Patient's heart failure is stable today. He does have an appointment the heart failure clinic next week. He has chronic edema in his lower extremities but his weight is down 5 pounds since his last office visit so we'll not make any medication and changes. Patient's breathing is stable.

## 2012-10-25 NOTE — Assessment & Plan Note (Signed)
Stable

## 2012-10-27 ENCOUNTER — Encounter: Payer: Self-pay | Admitting: Vascular Surgery

## 2012-10-27 ENCOUNTER — Ambulatory Visit (INDEPENDENT_AMBULATORY_CARE_PROVIDER_SITE_OTHER): Payer: Medicare Other | Admitting: Vascular Surgery

## 2012-10-27 ENCOUNTER — Other Ambulatory Visit: Payer: Self-pay

## 2012-10-27 ENCOUNTER — Encounter: Payer: Self-pay | Admitting: Cardiovascular Disease

## 2012-10-27 VITALS — BP 177/90 | HR 79 | Temp 97.7°F | Ht 70.0 in | Wt 288.6 lb

## 2012-10-27 DIAGNOSIS — T82898A Other specified complication of vascular prosthetic devices, implants and grafts, initial encounter: Secondary | ICD-10-CM | POA: Insufficient documentation

## 2012-10-27 DIAGNOSIS — N184 Chronic kidney disease, stage 4 (severe): Secondary | ICD-10-CM

## 2012-10-27 NOTE — Progress Notes (Signed)
[facility]   Brief History and Physical  History of Present Illness  Tim Herrera is a 77 y.o. male who presents with chief complaint: swollen left arm.  The patient presents today for re-evaluation of L arm s/p single stage BVT done by Dr. Darrick Penna on 10/09/12.    Pt continues to have increasing swelling in L arm.  Past Medical History  Diagnosis Date  . Hypertension   . CAD (coronary artery disease)     Cardiac cath May 2012 with mild plaque LAD and Circumflex and severe disease in  very small PDA branch of RCA.   Marland Kitchen Anemia   . Fatty liver disease, nonalcoholic   . Renal cyst   . Morbid obesity   . Chronic kidney disease (CKD), stage IV (severe) 01/26/2012    Followed by Dr Abel Presto at [facility].  R handed. Hx R hydro in Nov 2103 treated with R ureteral stent (UPJ obstruction) but creatinine did not improve (high 2's).     . Acute on chronic combined systolic and diastolic CHF (congestive heart failure) 04/09/2012    Last EF in Jun 2013 was 25-30%   . Diabetes mellitus     prediabetic - 80-90s    Past Surgical History  Procedure Laterality Date  . Cystoscopy w/ ureteral stent placement  04/15/2012    Procedure: CYSTOSCOPY WITH RETROGRADE PYELOGRAM/URETERAL STENT PLACEMENT;  Surgeon: Milford Cage, MD;  Location: [facility];  Service: Urology;  Laterality: Right;  Cysto/Right Retrograde Pyelogram/Right Ureteral Stent  . Tonsillectomy      as child  . Bascilic vein transposition Left 10/09/2012    Procedure: BASCILIC VEIN TRANSPOSITION;  Surgeon: Sherren Kerns, MD;  Location: [facility] OR;  Service: Vascular;  Laterality: Left;  Ultrasound guided    History   Social History  . Marital Status: Married    Spouse Name: N/A    Number of Children: 1  . Years of Education: N/A   Occupational History  . driver     s[employer]   Social History Main Topics  . Smoking status: Never Smoker   . Smokeless tobacco: Never Used  . Alcohol Use: No  .  Drug Use: No  . Sexually Active: No   Other Topics Concern  . Not on file   Social History Narrative   Driver for [employer].    Family History  Problem Relation Age of Onset  . Hypertension Mother   . Heart attack Brother     Current Outpatient Prescriptions on File Prior to Visit  Medication Sig Dispense Refill  . albuterol (VENTOLIN HFA) 108 (90 BASE) MCG/ACT inhaler Inhale 2 puffs into the lungs every 4 (four) hours as needed. For shortness of breath.      Marland Kitchen aspirin EC 81 MG tablet Take 81 mg by mouth every morning.       . calcitRIOL (ROCALTROL) 0.25 MCG capsule Take 0.25 mcg by mouth daily.      . carvedilol (COREG) 12.5 MG tablet Take 12.5 mg by mouth 2 (two) times daily with a meal.       . ferrous sulfate 325 (65 FE) MG tablet Take 325 mg by mouth daily with breakfast.      . furosemide (LASIX) 40 MG tablet Take 80 mg by mouth 2 (two) times daily.      . hydrALAZINE (APRESOLINE) 25 MG tablet Take 12.5 mg by mouth 3 (three) times daily.       Marland Kitchen oxyCODONE (OXY  IR/ROXICODONE) 5 MG immediate release tablet Take 1 tablet (5 mg total) by mouth every 6 (six) hours as needed for pain.  30 tablet  0  . spironolactone (ALDACTONE) 25 MG tablet Take 25 mg by mouth daily.       No current facility-administered medications on file prior to visit.    No Known Allergies  Review of Systems: As listed above, otherwise negative.  Physical Examination  Filed Vitals:   10/27/12 0926  BP: 177/90  Pulse: 79  Temp: 97.7 F (36.5 C)  TempSrc: Oral  Height: 5\' 10"  (1.778 m)  Weight: 288 lb 9.6 oz (130.908 kg)  SpO2: 100%    General: A&O x 3, WDWN  Pulmonary: Sym exp, good air movt, CTAB, no rales, rhonchi, & wheezing  Cardiac: RRR, Nl S1, S2, no Murmurs, rubs or gallops  Gastrointestinal: soft, NTND, -G/R, - HSM, - masses, - CVAT B  Musculoskeletal: M/S 5/5 throughout except L hand hand grip 4/5, Extremities without ischemic changes , faintly palpable thrill,  +bruit, extremely swollen L arm, normal size R arm  Laboratory See iStat  Medical Decision Making  Tim Herrera is a 77 y.o. 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   This pt's options include: ligating the L BVT vs L arm fistulogram and possible intervention.  I discussed with the patient that I expect more difficulty cannulating the fistula given the enormous edema present in this patient but without attempting to salvage this fistula by venoplasty of any stenosis, the BVT will need to ligated.  The pt decided to proceed with fistulogram, scheduled for Nov 02, 2012. I discussed with the patient the nature of angiographic procedures, especially the limited patencies of any endovascular intervention.  The patient is aware of that the risks of an angiographic procedure include but are not limited to: bleeding, infection, access site complications, renal failure, embolization, rupture of vessel, dissection, possible need for emergent surgical intervention, possible need for surgical procedures to treat the patient's pathology, and stroke and death.    The patient is aware of the risks and agrees to proceed.  Leonides Sake, MD [facility] [place] Office: [phone] Pager: [phone]  10/27/2012, 9:44 AM

## 2012-11-01 ENCOUNTER — Encounter: Payer: Self-pay | Admitting: Vascular Surgery

## 2012-11-01 ENCOUNTER — Encounter (HOSPITAL_COMMUNITY): Payer: Medicare Other

## 2012-11-01 MED ORDER — SODIUM CHLORIDE 0.9 % IJ SOLN: 3.0000 mL | INTRAMUSCULAR | Status: DC | PRN

## 2012-11-02 ENCOUNTER — Ambulatory Visit: Payer: Medicare Other | Admitting: Vascular Surgery

## 2012-11-02 ENCOUNTER — Ambulatory Visit (HOSPITAL_COMMUNITY)
Admission: RE | Admit: 2012-11-02 | Discharge: 2012-11-02 | Disposition: A | Discharge status: Home / Self Care | Payer: Medicare Other | Source: Ambulatory Visit | Attending: Vascular Surgery | Admitting: Vascular Surgery

## 2012-11-02 ENCOUNTER — Telehealth: Payer: Self-pay | Admitting: Vascular Surgery

## 2012-11-02 ENCOUNTER — Encounter (HOSPITAL_COMMUNITY)
Admission: RE | Disposition: A | Discharge status: Home / Self Care | Payer: Self-pay | Source: Ambulatory Visit | Attending: Vascular Surgery

## 2012-11-02 DIAGNOSIS — Q619 Cystic kidney disease, unspecified: Secondary | ICD-10-CM | POA: Insufficient documentation

## 2012-11-02 DIAGNOSIS — D649 Anemia, unspecified: Secondary | ICD-10-CM | POA: Insufficient documentation

## 2012-11-02 DIAGNOSIS — K7689 Other specified diseases of liver: Secondary | ICD-10-CM | POA: Insufficient documentation

## 2012-11-02 DIAGNOSIS — I509 Heart failure, unspecified: Secondary | ICD-10-CM | POA: Insufficient documentation

## 2012-11-02 DIAGNOSIS — N186 End stage renal disease: Secondary | ICD-10-CM | POA: Insufficient documentation

## 2012-11-02 DIAGNOSIS — Z6841 Body Mass Index (BMI) 40.0 and over, adult: Secondary | ICD-10-CM | POA: Insufficient documentation

## 2012-11-02 DIAGNOSIS — Z7982 Long term (current) use of aspirin: Secondary | ICD-10-CM | POA: Insufficient documentation

## 2012-11-02 DIAGNOSIS — Z79899 Other long term (current) drug therapy: Secondary | ICD-10-CM | POA: Insufficient documentation

## 2012-11-02 DIAGNOSIS — T82898A Other specified complication of vascular prosthetic devices, implants and grafts, initial encounter: Secondary | ICD-10-CM

## 2012-11-02 DIAGNOSIS — I2510 Atherosclerotic heart disease of native coronary artery without angina pectoris: Secondary | ICD-10-CM | POA: Insufficient documentation

## 2012-11-02 DIAGNOSIS — R7309 Other abnormal glucose: Secondary | ICD-10-CM | POA: Insufficient documentation

## 2012-11-02 DIAGNOSIS — M7989 Other specified soft tissue disorders: Secondary | ICD-10-CM | POA: Insufficient documentation

## 2012-11-02 DIAGNOSIS — I5043 Acute on chronic combined systolic (congestive) and diastolic (congestive) heart failure: Secondary | ICD-10-CM | POA: Insufficient documentation

## 2012-11-02 DIAGNOSIS — I120 Hypertensive chronic kidney disease with stage 5 chronic kidney disease or end stage renal disease: Secondary | ICD-10-CM | POA: Insufficient documentation

## 2012-11-02 HISTORY — PX: SHUNTOGRAM: SHX5491

## 2012-11-02 LAB — POCT I-STAT, CHEM 8
Calcium, Ion: 1.07 mmol/L — ABNORMAL LOW (ref 1.13–1.30)
Creatinine, Ser: 3.8 mg/dL — ABNORMAL HIGH (ref 0.50–1.35)
Glucose, Bld: 90 mg/dL (ref 70–99)
Hemoglobin: 9.9 g/dL — ABNORMAL LOW (ref 13.0–17.0)
Sodium: 142 mEq/L (ref 135–145)
TCO2: 19 mmol/L (ref 0–100)

## 2012-11-02 SURGERY — ASSESSMENT, SHUNT FUNCTION, WITH CONTRAST RADIOGRAPHIC STUDY
Anesthesia: LOCAL | Laterality: Left

## 2012-11-02 NOTE — H&P (View-Only) (Signed)
[facility] [place]  Brief History and Physical  History of Present Illness  Tim Herrera is a 77 y.o. male who presents with chief complaint: swollen left arm.  The patient presents today for re-evaluation of L arm s/p single stage BVT done by Dr. Fields on 10/09/12.    Pt continues to have increasing swelling in L arm.  Past Medical History  Diagnosis Date  . Hypertension   . CAD (coronary artery disease)     Cardiac cath May 2012 with mild plaque LAD and Circumflex and severe disease in  very small PDA branch of RCA.   . Anemia   . Fatty liver disease, nonalcoholic   . Renal cyst   . Morbid obesity   . Chronic kidney disease (CKD), stage IV (severe) 01/26/2012    Followed by Dr Colodonato at [facility].  R handed. Hx R hydro in Nov 2103 treated with R ureteral stent (UPJ obstruction) but creatinine did not improve (high 2's).     . Acute on chronic combined systolic and diastolic CHF (congestive heart failure) 04/09/2012    Last EF in Jun 2013 was 25-30%   . Diabetes mellitus     prediabetic - 80-90s    Past Surgical History  Procedure Laterality Date  . Cystoscopy w/ ureteral stent placement  04/15/2012    Procedure: CYSTOSCOPY WITH RETROGRADE PYELOGRAM/URETERAL STENT PLACEMENT;  Surgeon: Daniel Young Woodruff, MD;  Location: [facility];  Service: Urology;  Laterality: Right;  Cysto/Right Retrograde Pyelogram/Right Ureteral Stent  . Tonsillectomy      as child  . Bascilic vein transposition Left 10/09/2012    Procedure: BASCILIC VEIN TRANSPOSITION;  Surgeon: Charles E Fields, MD;  Location: [facility] OR;  Service: Vascular;  Laterality: Left;  Ultrasound guided    History   Social History  . Marital Status: Married    Spouse Name: N/A    Number of Children: 1  . Years of Education: N/A   Occupational History  . driver     [employer]   Social History Main Topics  . Smoking status: Never Smoker   . Smokeless tobacco: Never Used  . Alcohol Use: No  .  Drug Use: No  . Sexually Active: No   Other Topics Concern  . Not on file   Social History Narrative   Driver for [employer].    Family History  Problem Relation Age of Onset  . Hypertension Mother   . Heart attack Brother     Current Outpatient Prescriptions on File Prior to Visit  Medication Sig Dispense Refill  . albuterol (VENTOLIN HFA) 108 (90 BASE) MCG/ACT inhaler Inhale 2 puffs into the lungs every 4 (four) hours as needed. For shortness of breath.      . aspirin EC 81 MG tablet Take 81 mg by mouth every morning.       . calcitRIOL (ROCALTROL) 0.25 MCG capsule Take 0.25 mcg by mouth daily.      . carvedilol (COREG) 12.5 MG tablet Take 12.5 mg by mouth 2 (two) times daily with a meal.       . ferrous sulfate 325 (65 FE) MG tablet Take 325 mg by mouth daily with breakfast.      . furosemide (LASIX) 40 MG tablet Take 80 mg by mouth 2 (two) times daily.      . hydrALAZINE (APRESOLINE) 25 MG tablet Take 12.5 mg by mouth 3 (three) times daily.       . oxyCODONE (OXY   IR/ROXICODONE) 5 MG immediate release tablet Take 1 tablet (5 mg total) by mouth every 6 (six) hours as needed for pain.  30 tablet  0  . spironolactone (ALDACTONE) 25 MG tablet Take 25 mg by mouth daily.       No current facility-administered medications on file prior to visit.    No Known Allergies  Review of Systems: As listed above, otherwise negative.  Physical Examination  Filed Vitals:   10/27/12 0926  BP: 177/90  Pulse: 79  Temp: 97.7 F (36.5 C)  TempSrc: Oral  Height: 5' 10" (1.778 m)  Weight: 288 lb 9.6 oz (130.908 kg)  SpO2: 100%    General: A&O x 3, WDWN  Pulmonary: Sym exp, good air movt, CTAB, no rales, rhonchi, & wheezing  Cardiac: RRR, Nl S1, S2, no Murmurs, rubs or gallops  Gastrointestinal: soft, NTND, -G/R, - HSM, - masses, - CVAT B  Musculoskeletal: M/S 5/5 throughout except L hand hand grip 4/5, Extremities without ischemic changes , faintly palpable thrill,  +bruit, extremely swollen L arm, normal size R arm  Laboratory See iStat  Medical Decision Making  Tim Herrera is a 77 y.o. 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   This pt's options include: ligating the L BVT vs L arm fistulogram and possible intervention.  I discussed with the patient that I expect more difficulty cannulating the fistula given the enormous edema present in this patient but without attempting to salvage this fistula by venoplasty of any stenosis, the BVT will need to ligated.  The pt decided to proceed with fistulogram, scheduled for Nov 02, 2012. I discussed with the patient the nature of angiographic procedures, especially the limited patencies of any endovascular intervention.  The patient is aware of that the risks of an angiographic procedure include but are not limited to: bleeding, infection, access site complications, renal failure, embolization, rupture of vessel, dissection, possible need for emergent surgical intervention, possible need for surgical procedures to treat the patient's pathology, and stroke and death.    The patient is aware of the risks and agrees to proceed.  [name], MD [facility] [place] Office: [phone] Pager: [phone]  10/27/2012, 9:44 AM    

## 2012-11-02 NOTE — Interval H&P Note (Signed)
[facility] of   History and Physical Update  The patient was interviewed and re-examined.  The patient's previous History and Physical has been reviewed and is unchanged.  There is no change in the plan of care: L arm fistulogram, possible intervention.  Leonides Sake, MD [facility] [place] Office: [phone] Pager: [phone]  11/02/2012, 7:00 AM

## 2012-11-02 NOTE — Telephone Encounter (Signed)
LVM, sent letter - kf °

## 2012-11-02 NOTE — Op Note (Signed)
OPERATIVE NOTE   PROCEDURE: 1. left basilic vein transposition cannulation under ultrasound guidance 2. left arm fistulogram  PRE-OPERATIVE DIAGNOSIS: Left arm swelling, possible venous obstruction  POST-OPERATIVE DIAGNOSIS: same as above   SURGEON: Leonides Sake, MD  ANESTHESIA: local  ESTIMATED BLOOD LOSS: 5 cc  FINDING(S): 1. Widely patent left basilic vein transposition  2. Unable to fully evaluate perianastomostic segment as unable to fully compress fistula  SPECIMEN(S):  None  CONTRAST: 35 cc  INDICATIONS: Tim Herrera is a 77 y.o. male who  presents with severely swollen left arm after placement of left basilic vein transposition.  I recommended evaluation of the fistula with fistulogram to relieve any underlying venous stenosis.  The patient is scheduled for left arm fistulogram.  The patient is aware the risks include but are not limited to: bleeding, infection, thrombosis of the cannulated access, and possible anaphylactic reaction to the contrast.  The patient is aware of the risks of the procedure and elects to proceed forward.  DESCRIPTION: After full informed written consent was obtained, the patient was brought back to the angiography suite and placed supine upon the angiography table.  The patient was connected to monitoring equipment.  The left arm was prepped and draped in the standard fashion for a left arm fistulogram.  Under ultrasound guidance, the left basilic vein transposition was cannulated with a micropuncture needle.  The microwire was advanced into the fistula and the needle was exchanged for the a microsheath, which was lodged 2 cm into the access.  The wire was removed and the sheath was connected to the IV extension tubing.  Hand injections were completed to image the access from the antecubitum up to the level of axilla.  The central venous structures were also imaged by hand injections.  I tried to compress the fistula and reflux the contrast toward the  anastomosis but I could not get adequate compression due to the arm edema.  Based on the images, this patient will need: nothing.  A 4-0 Monocryl purse-string suture was sewn around the sheath.  The sheath was removed while tying down the suture.  A sterile bandage was applied to the puncture site.  COMPLICATIONS: none  CONDITION: stable  Leonides Sake, MD [facility] [place] Office: [phone] Pager: [phone]  11/02/2012 9:47 AM

## 2012-11-02 NOTE — Telephone Encounter (Signed)
Message copied by Margaretmary Eddy on Thu Nov 02, 2012  2:59 PM ------      Message from: Melene Plan      Created: Thu Nov 02, 2012 10:03 AM                   ----- Message -----         From: Fransisco Hertz, MD         Sent: 11/02/2012   9:51 AM           To: Reuel Derby, Melene Plan, RN            Dillian Feig      [phone]      [birth year]                  PROCEDURE:      left basilic vein transposition cannulation under ultrasound guidance      left arm fistulogram            Follow-up: 4 weeks with Dr. Darrick Penna       ------

## 2012-11-09 ENCOUNTER — Ambulatory Visit: Payer: Medicare Other | Admitting: Vascular Surgery

## 2012-11-15 ENCOUNTER — Other Ambulatory Visit: Payer: Self-pay | Admitting: Urology

## 2012-11-27 ENCOUNTER — Ambulatory Visit (INDEPENDENT_AMBULATORY_CARE_PROVIDER_SITE_OTHER): Payer: Medicare Other | Admitting: Cardiovascular Disease

## 2012-11-27 ENCOUNTER — Encounter: Payer: Self-pay | Admitting: Cardiovascular Disease

## 2012-11-27 VITALS — BP 138/72 | HR 61 | Ht 70.5 in | Wt 317.0 lb

## 2012-11-27 DIAGNOSIS — I255 Ischemic cardiomyopathy: Secondary | ICD-10-CM

## 2012-11-27 DIAGNOSIS — I2589 Other forms of chronic ischemic heart disease: Secondary | ICD-10-CM

## 2012-11-27 DIAGNOSIS — I5023 Acute on chronic systolic (congestive) heart failure: Secondary | ICD-10-CM

## 2012-11-27 DIAGNOSIS — I2510 Atherosclerotic heart disease of native coronary artery without angina pectoris: Secondary | ICD-10-CM

## 2012-11-27 DIAGNOSIS — I509 Heart failure, unspecified: Secondary | ICD-10-CM

## 2012-11-27 MED ORDER — FUROSEMIDE 80 MG PO TABS: 80.0000 mg | ORAL_TABLET | Freq: Two times a day (BID) | ORAL | Status: AC

## 2012-11-27 NOTE — Progress Notes (Signed)
History of Present Illness: 77 yo AAM with history of HTN, non-ischemic cardiomyopathy, CAD, chronic systolic CHF, stage IV CKD who is here today for cardiac follow up. He was initially seen March 2012 for evaluation of an abnormal EKG and c/o lower extremity edema. He was started on Lasix by primary care and had considerable improvement in his lower ext edema. EKG showed NSR, Q waves III, AVF and poor R wave progression through the precordial leads. Echo 08/3010 showed mild LVH with moderate LV systolic dysfunction with LVEF of 30-35%. Stress myoview showed inferior wall scar with small area of possible apical ischemia. Cardiac cath 10/30/10 showed mild plaque in the LAD and Circumflex with severe disease in a very small PDA branch. We pursued medical management at that time. Repeat echo June 2013 with LVEF 25-30%. He was referred to see Dr. Graciela Husbands for consideration for ICD 01/06/12 but he felt that medications should be optimized before ICD would be considered. The patient had stopped his beta blocker. Coreg was started and aldactone was added. He was referred to the CHF clinic by Dr. Graciela Husbands and was seen in the CHF clinic twice in July and August 2013. He was admitted to [facility] 09/03/12 with volume overload, up over 40 lbs. He was diuresed with IV Lasix, guided by Nephrology given his baseline renal insufficiency. He recently underwent left basilic vein transposition fistula 10/09/12 for impending dialysis. He presented to the emergency room with severe edema and erythema of the left upper extremity. It was felt he had a possible infection and was placed on antibiotics, but also an element of venous reflux or hypertension due to disrupted veins when mobilizing basilic vein. Has finished his antibiotics.   He is here today for cardiac follow up. He missed his follow up in CHF clinic 6 weeks ago. He reports feeling well today with no chest pain and minimal dyspnea with exertion however his weight is up 34 lbs  over last 4 weeks by our records. He is a poor historian and rarely knows his medications. He thinks he has been taking Lasix 40 mg po BID (he has bottles for with 40 mg and 80 mg pills but the bottle with 40 mg pills is nearly empty and the bottle with 80mg  pills is full). He was seen in Nephrology by Dr. Isa Rankin on 10/27/12 and renal function was slightly worsened with creatinine of 3.12. LE edema is stable and improved per patient.   Primary Care Physician: [name]  Past Medical History  Diagnosis Date  . Hypertension   . CAD (coronary artery disease)     Cardiac cath May 2012 with mild plaque LAD and Circumflex and severe disease in  very small PDA branch of RCA.   Marland Kitchen Anemia   . Fatty liver disease, nonalcoholic   . Renal cyst   . Morbid obesity   . Chronic kidney disease (CKD), stage IV (severe) 01/26/2012    Followed by Dr Abel Presto at [facility].  R handed. Hx R hydro in Nov 2103 treated with R ureteral stent (UPJ obstruction) but creatinine did not improve (high 2's).     . Acute on chronic combined systolic and diastolic CHF (congestive heart failure) 04/09/2012    Last EF in Jun 2013 was 25-30%   . Diabetes mellitus     prediabetic - 80-90s    Past Surgical History  Procedure Laterality Date  . Cystoscopy w/ ureteral stent placement  04/15/2012    Procedure: CYSTOSCOPY WITH RETROGRADE PYELOGRAM/URETERAL STENT  PLACEMENT;  Surgeon: Milford Cage, MD;  Location: [facility];  Service: Urology;  Laterality: Right;  Cysto/Right Retrograde Pyelogram/Right Ureteral Stent  . Tonsillectomy      as child  . Bascilic vein transposition Left 10/09/2012    Procedure: BASCILIC VEIN TRANSPOSITION;  Surgeon: Sherren Kerns, MD;  Location: [facility] OR;  Service: Vascular;  Laterality: Left;  Ultrasound guided    Current Outpatient Prescriptions  Medication Sig Dispense Refill  . albuterol (VENTOLIN HFA) 108 (90 BASE) MCG/ACT inhaler Inhale 2 puffs into the lungs every 4 (four) hours as needed.  For shortness of breath.      Marland Kitchen aspirin EC 81 MG tablet Take 81 mg by mouth every morning.       . carvedilol (COREG) 12.5 MG tablet Take 25 mg by mouth 2 (two) times daily with a meal.       . ferrous sulfate 325 (65 FE) MG tablet Take 325 mg by mouth daily with breakfast.      . furosemide (LASIX) 40 MG tablet Take 40 mg by mouth 2 (two) times daily.       . hydrALAZINE (APRESOLINE) 25 MG tablet Take 12.5 mg by mouth 3 (three) times daily.       . isosorbide mononitrate (IMDUR) 30 MG 24 hr tablet Take 30 mg by mouth daily.       No current facility-administered medications for this visit.    No Known Allergies  History   Social History  . Marital Status: Married    Spouse Name: N/A    Number of Children: 1  . Years of Education: N/A   Occupational History  . driver     [employer]   Social History Main Topics  . Smoking status: Never Smoker   . Smokeless tobacco: Never Used  . Alcohol Use: No  . Drug Use: No  . Sexually Active: No   Other Topics Concern  . Not on file   Social History Narrative   Driver for [employer].    Family History  Problem Relation Age of Onset  . Hypertension Mother   . Heart attack Brother     Review of Systems:  As stated in the HPI and otherwise negative.   BP 138/72  Pulse 61  Ht 5' 10.5" (1.791 m)  Wt 317 lb (143.79 kg)  BMI 44.83 kg/m2  SpO2 95%  Physical Examination: General: Well developed, well nourished, NAD HEENT: OP clear, mucus membranes moist SKIN: warm, dry. No rashes. Neuro: No focal deficits Musculoskeletal: Muscle strength 5/5 all ext Psychiatric: Mood and affect normal Neck: No JVD, no carotid bruits, no thyromegaly, no lymphadenopathy. Lungs:Clear bilaterally, no wheezes, rhonci, crackles Cardiovascular: Regular rate and rhythm. No murmurs, gallops or rubs. Abdomen:Soft. Bowel sounds present. Non-tender.  Extremities: 1-2+ bilateral lower extremity edema.  Echo 09/04/12: Left  ventricle: Endocardium not well seen. Overall appears to be moderate diffuse hypokinises possibly worse in septum and inferior wall The cavity size was mildly dilated. Wall thickness was increased in a pattern of mild LVH. The estimated ejection fraction was 35%. Diffuse hypokinesis. - Left atrium: The atrium was mildly dilated. - Right ventricle: The cavity size was mildly dilated. Wall thickness was normal.   Assessment and Plan:   1. Non-ischemic cardiomyopathy: He is known to have minor CAD. Will continue medical management of his cardiomyopathy. Last LVEF 35%. He was seen in EP clinic 01/06/12. He discussed ICD but pt had stopped some of his medications. Plan  was to resume Coreg, add aldactone and refer to CHF clinic for uptitration of meds. Since LVEF improved to 35%, will continue medication for now including Coreg, Imdur, Hydralazine, aldactone. He is not on an Ace-inh/ARB secondary to his chronic renal insufficiency.   2. Chronic systolic CHF: He is up 34 lbs per records but feels well. Will increase Lasix to 80 mg po BID. He arrange f/u in CHF clinic next week. He will need a BMET next week at f/u. Diuresis will need to be followed closely with chronic kidney disease. If unable to obtain adequate diuresis with increased dose of Lasix as an outpatient, will need to consider admission for IV Lasix and close monitoring of renal function.   3. CAD: Stable. No chest pains.

## 2012-11-27 NOTE — Patient Instructions (Addendum)
Your physician recommends that you schedule a follow-up appointment in:  7-10 days with [facility].   This is a follow up appointment. Pt has been seen there in past.   Your physician has recommended you make the following change in your medication:  Take furosemide 80 mg by mouth twice daily

## 2012-11-29 ENCOUNTER — Encounter: Payer: Self-pay | Admitting: Vascular Surgery

## 2012-11-30 ENCOUNTER — Ambulatory Visit (INDEPENDENT_AMBULATORY_CARE_PROVIDER_SITE_OTHER): Payer: Medicare Other | Admitting: Vascular Surgery

## 2012-11-30 ENCOUNTER — Encounter: Payer: Self-pay | Admitting: Vascular Surgery

## 2012-11-30 VITALS — BP 143/82 | HR 55 | Resp 16 | Ht 70.5 in | Wt 318.0 lb

## 2012-11-30 DIAGNOSIS — N186 End stage renal disease: Secondary | ICD-10-CM

## 2012-11-30 DIAGNOSIS — T82898A Other specified complication of vascular prosthetic devices, implants and grafts, initial encounter: Secondary | ICD-10-CM

## 2012-11-30 NOTE — Progress Notes (Signed)
Patient is a 77 year old male who returns for postoperative followup today. He had a left basilic vein transposition fistula placed approximately 2 months ago. Postoperatively he developed significant left upper extremity edema. He had a fistulogram with central venogram on May 22 by Dr. Imogene Burn. This showed no central venous narrowing. The swelling has improved somewhat. It is better by about 50%.  Physical exam:  Filed Vitals:   11/30/12 1320  BP: 143/82  Pulse: 55  Resp: 16  Height: 5' 10.5" (1.791 m)  Weight: 318 lb (144.244 kg)  SpO2: 100%   Extremity: edema from hand to shoulder with peau dorange. + thrill in fistula  Assessment: Improving edema left upper arm basilic vein fistula. Plan: Followup one month hopefully edema will continue to resolve over that time.  Fabienne Bruns, MD [facility] [place] Office: [phone] Pager: [phone]

## 2012-12-10 ENCOUNTER — Emergency Department (HOSPITAL_COMMUNITY)
Admission: EM | Admit: 2012-12-10 | Discharge: 2012-12-12 | Disposition: E | Payer: Medicare Other | Attending: Emergency Medicine | Admitting: Emergency Medicine

## 2012-12-10 DIAGNOSIS — I5043 Acute on chronic combined systolic (congestive) and diastolic (congestive) heart failure: Secondary | ICD-10-CM | POA: Insufficient documentation

## 2012-12-10 DIAGNOSIS — I2510 Atherosclerotic heart disease of native coronary artery without angina pectoris: Secondary | ICD-10-CM | POA: Insufficient documentation

## 2012-12-10 DIAGNOSIS — Z8719 Personal history of other diseases of the digestive system: Secondary | ICD-10-CM | POA: Insufficient documentation

## 2012-12-10 DIAGNOSIS — Z87448 Personal history of other diseases of urinary system: Secondary | ICD-10-CM | POA: Insufficient documentation

## 2012-12-10 DIAGNOSIS — E119 Type 2 diabetes mellitus without complications: Secondary | ICD-10-CM | POA: Insufficient documentation

## 2012-12-10 DIAGNOSIS — N184 Chronic kidney disease, stage 4 (severe): Secondary | ICD-10-CM | POA: Insufficient documentation

## 2012-12-10 DIAGNOSIS — Z7982 Long term (current) use of aspirin: Secondary | ICD-10-CM | POA: Insufficient documentation

## 2012-12-10 DIAGNOSIS — I129 Hypertensive chronic kidney disease with stage 1 through stage 4 chronic kidney disease, or unspecified chronic kidney disease: Secondary | ICD-10-CM | POA: Insufficient documentation

## 2012-12-10 DIAGNOSIS — R142 Eructation: Secondary | ICD-10-CM | POA: Insufficient documentation

## 2012-12-10 DIAGNOSIS — D649 Anemia, unspecified: Secondary | ICD-10-CM | POA: Insufficient documentation

## 2012-12-10 DIAGNOSIS — R141 Gas pain: Secondary | ICD-10-CM | POA: Insufficient documentation

## 2012-12-10 DIAGNOSIS — Z79899 Other long term (current) drug therapy: Secondary | ICD-10-CM | POA: Insufficient documentation

## 2012-12-10 DIAGNOSIS — R61 Generalized hyperhidrosis: Secondary | ICD-10-CM | POA: Insufficient documentation

## 2012-12-10 DIAGNOSIS — I469 Cardiac arrest, cause unspecified: Secondary | ICD-10-CM | POA: Insufficient documentation

## 2012-12-10 MED ORDER — SODIUM BICARBONATE 8.4 % IV SOLN
INTRAVENOUS | Status: AC | PRN
  Administered 2012-12-10: 100 meq via INTRAVENOUS

## 2012-12-10 MED ORDER — EPINEPHRINE HCL 0.1 MG/ML IJ SOSY
PREFILLED_SYRINGE | INTRAMUSCULAR | Status: AC | PRN
  Administered 2012-12-10 (×3): 0.1 mg via INTRAVENOUS

## 2012-12-10 MED FILL — Medication: Qty: 1 | Status: AC

## 2012-12-11 LAB — GLUCOSE, CAPILLARY: Glucose-Capillary: 170 mg/dL — ABNORMAL HIGH (ref 70–99)

## 2012-12-12 ENCOUNTER — Ambulatory Visit (HOSPITAL_COMMUNITY): Payer: Medicare Other

## 2012-12-12 NOTE — ED Notes (Signed)
Pt transported to morgue. Bed placement notified.

## 2012-12-12 NOTE — ED Provider Notes (Signed)
History    77yM brought in by EMS. Called because pt unresponsive at home. Unwitnessed, but family had seen him just a few minutes before. Apparently had been complaining of CP recently but had been refusing to come to hospital. Hx of CKD, CAD, CHF, HTN, diabetes. PT in PEA on EMS arrival. [facility] airway placed. Glucose in 100s. Multiple rounds of epi. PEA up until pulling up to ED when pulses felt. PEA in ER on first rhythm check.  CSN: [id number] Arrival date & time 2012-12-14  1013  First MD Initiated Contact with Patient 14-Dec-2012 1028     Chief Complaint  Patient presents with  . Cardiac Arrest   (Consider location/radiation/quality/duration/timing/severity/associated sxs/prior Treatment) HPI Past Medical History  Diagnosis Date  . Hypertension   . CAD (coronary artery disease)     Cardiac cath May 2012 with mild plaque LAD and Circumflex and severe disease in  very small PDA branch of RCA.   Marland Kitchen Anemia   . Fatty liver disease, nonalcoholic   . Renal cyst   . Morbid obesity   . Chronic kidney disease (CKD), stage IV (severe) 01/26/2012    Followed by Dr Abel Presto at [facility].  R handed. Hx R hydro in Nov 2103 treated with R ureteral stent (UPJ obstruction) but creatinine did not improve (high 2's).     . Acute on chronic combined systolic and diastolic CHF (congestive heart failure) 04/09/2012    Last EF in Jun 2013 was 25-30%   . Diabetes mellitus     prediabetic - 80-90s   Past Surgical History  Procedure Laterality Date  . Cystoscopy w/ ureteral stent placement  04/15/2012    Procedure: CYSTOSCOPY WITH RETROGRADE PYELOGRAM/URETERAL STENT PLACEMENT;  Surgeon: Milford Cage, MD;  Location: [facility];  Service: Urology;  Laterality: Right;  Cysto/Right Retrograde Pyelogram/Right Ureteral Stent  . Tonsillectomy      as child  . Bascilic vein transposition Left 10/09/2012    Procedure: BASCILIC VEIN TRANSPOSITION;  Surgeon: Sherren Kerns, MD;  Location: [facility] OR;  Service: Vascular;   Laterality: Left;  Ultrasound guided   Family History  Problem Relation Age of Onset  . Hypertension Mother   . Heart attack Brother    History  Substance Use Topics  . Smoking status: Never Smoker   . Smokeless tobacco: Never Used  . Alcohol Use: No    Review of Systems  Level 5 caveat because pt nonresponsive.   Allergies  Review of patient's allergies indicates no known allergies.  Home Medications   Current Outpatient Rx  Name  Route  Sig  Dispense  Refill  . albuterol (VENTOLIN HFA) 108 (90 BASE) MCG/ACT inhaler   Inhalation   Inhale 2 puffs into the lungs every 4 (four) hours as needed. For shortness of breath.         Marland Kitchen aspirin EC 81 MG tablet   Oral   Take 81 mg by mouth every morning.          . carvedilol (COREG) 12.5 MG tablet   Oral   Take 25 mg by mouth 2 (two) times daily with a meal.          . ferrous sulfate 325 (65 FE) MG tablet   Oral   Take 325 mg by mouth daily with breakfast.         . furosemide (LASIX) 80 MG tablet   Oral   Take 1 tablet (80 mg total) by mouth 2 (two) times daily.  60 tablet   3   . hydrALAZINE (APRESOLINE) 25 MG tablet   Oral   Take 12.5 mg by mouth 3 (three) times daily.          . isosorbide mononitrate (IMDUR) 30 MG 24 hr tablet   Oral   Take 30 mg by mouth daily.          Wt 320 lb (145.151 kg)  BMI 45.25 kg/m2 Physical Exam  Nursing note and vitals reviewed. Constitutional: He appears distressed.  Laying on Doctor, general practice. CPR in progress. Morbidly obese.  HENT:  Head: Normocephalic and atraumatic.  Eyes:  Pulses dilated and nonreactive.   Neck: Neck supple.  Cardiovascular:  No palpable carotid or femoral pulses.   Pulmonary/Chest:  King airway. Breath sounds b/l. Bagging easily.   Abdominal: Soft. He exhibits distension.  Neurological:  GCS 3T  Skin: Skin is warm. He is diaphoretic.    ED Course  Procedures (including critical care time)  Cardiopulmonary Resuscitation (CPR)  Procedure Note Directed/Performed by: Raeford Razor I personally directed ancillary staff and/or performed CPR in an effort to regain return of spontaneous circulation and to maintain cardiac, neuro and systemic perfusion.   Labs Reviewed - No data to display No results found. 1. Cardiac arrest     MDM  77yM presenting in cardiac arrest. PEA on EMS and most of transport. Did report reports brief return of pulses just prior to arrival to ED. In ED, PEA on first rhythm check. CPR continued but unfortunately remained in PEA. Bedside US with no cardiac motion. Time of death 1020.   Raeford Razor, MD 12/11/12 1438

## 2012-12-12 NOTE — Progress Notes (Signed)
Chaplain responded to ED page for post CPR pt. I met 3-4 members of pt's family in ED waiting area and took them to consult room A. I accompanied Dr. Juleen China as he gave them the news that pt had not survived. Family was shaken and tearful. Chaplain provided grief support. Over the next two hours many other family members arrived until eventually there were 25. I provided graham crackers and juice for pt's wife who was diabetic and needed something to eat. Several others requested juice or ginger ale. Pt was moved from Trauma C to C26. Family moved there to be with pt and also gathered in consult C next door. I cared for family until they departed. Family members included pastors from two or three different churches.

## 2012-12-12 NOTE — ED Notes (Addendum)
Pt presents to department via [facility] as CPR in progress. Pt became unresponsive at home when wife was attempting to help him into car. Per EMS pt was PEA initially, he re-gained pulses en route to hospital, but PEA on arrival to ED. IO to R lower leg. History of CHF, HTN, and DM.

## 2012-12-12 NOTE — Code Documentation (Signed)
PEA on monitor at the time. CPR continued. Dr. Juleen China at bedside, no cardiac activity noted on ultrasound.

## 2012-12-12 DEATH — deceased

## 2012-12-19 ENCOUNTER — Ambulatory Visit: Admit: 2012-12-19 | Payer: Medicare Other | Admitting: Urology

## 2012-12-19 SURGERY — CYSTOSCOPY, FLEXIBLE, WITH STENT REPLACEMENT
Anesthesia: General | Laterality: Right

## 2012-12-20 ENCOUNTER — Telehealth (HOSPITAL_COMMUNITY): Payer: Self-pay | Admitting: Emergency Medicine

## 2012-12-20 NOTE — ED Notes (Signed)
Spoke w/Laura from [facility].  MD on death certificate refusing to sign.  Dr Juleen China MD who pronounced.  Will confirm w/Dr Juleen China that he will sign and then let Clydene Pugh know so they can bring certificate over.

## 2013-01-04 ENCOUNTER — Ambulatory Visit: Payer: Medicare Other | Admitting: Vascular Surgery

## 2013-12-25 IMAGING — CR DG CHEST 2V
2 series · 2 of 2 positions shown · non-contrast
Comparison: 04/12/2012

CLINICAL DATA: Lower extremity edema.  Shortness of breath.
Weakness.  Cardiomyopathy.  Congestive heart failure.

CHEST - 2 VIEW

[w chest lat]
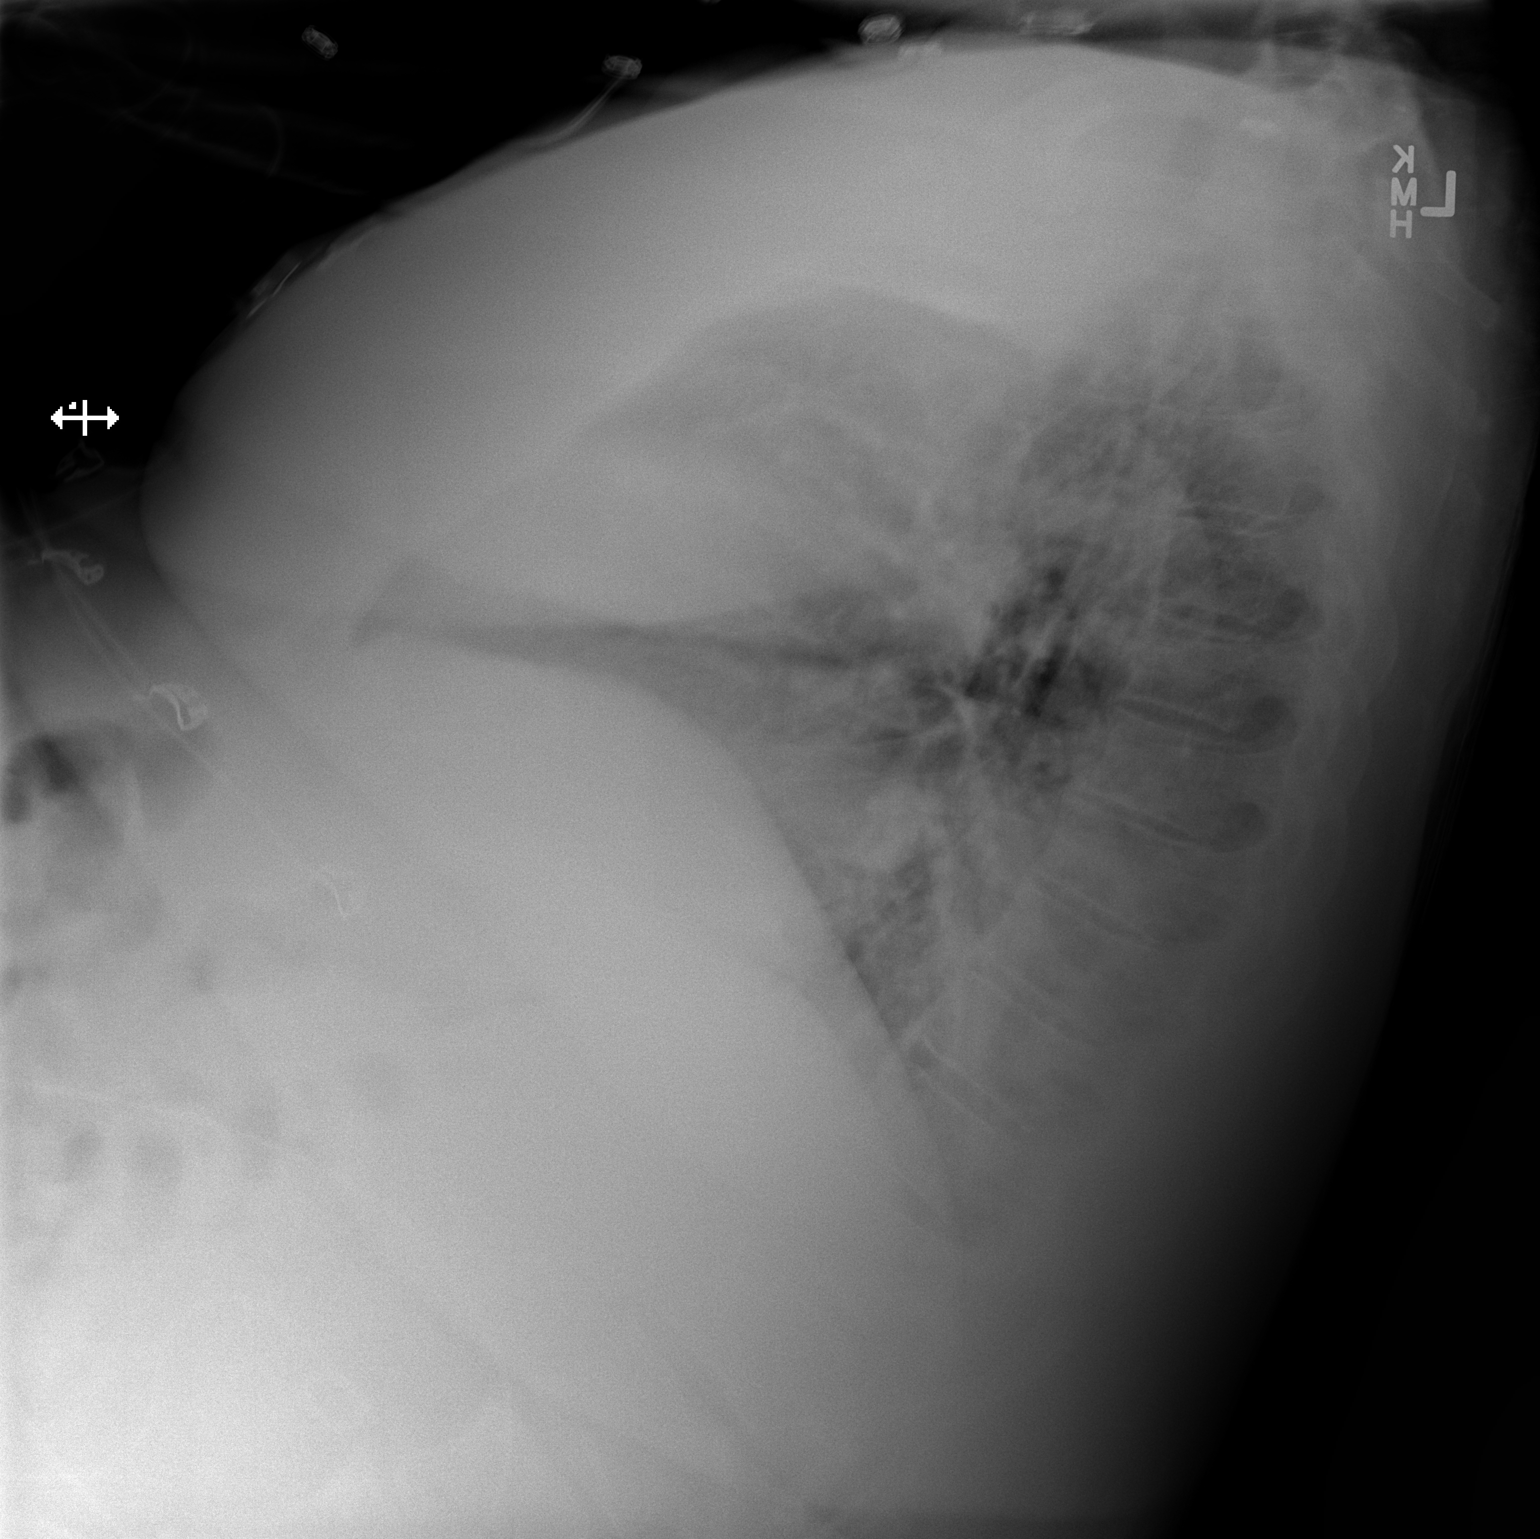

[x chest ap]
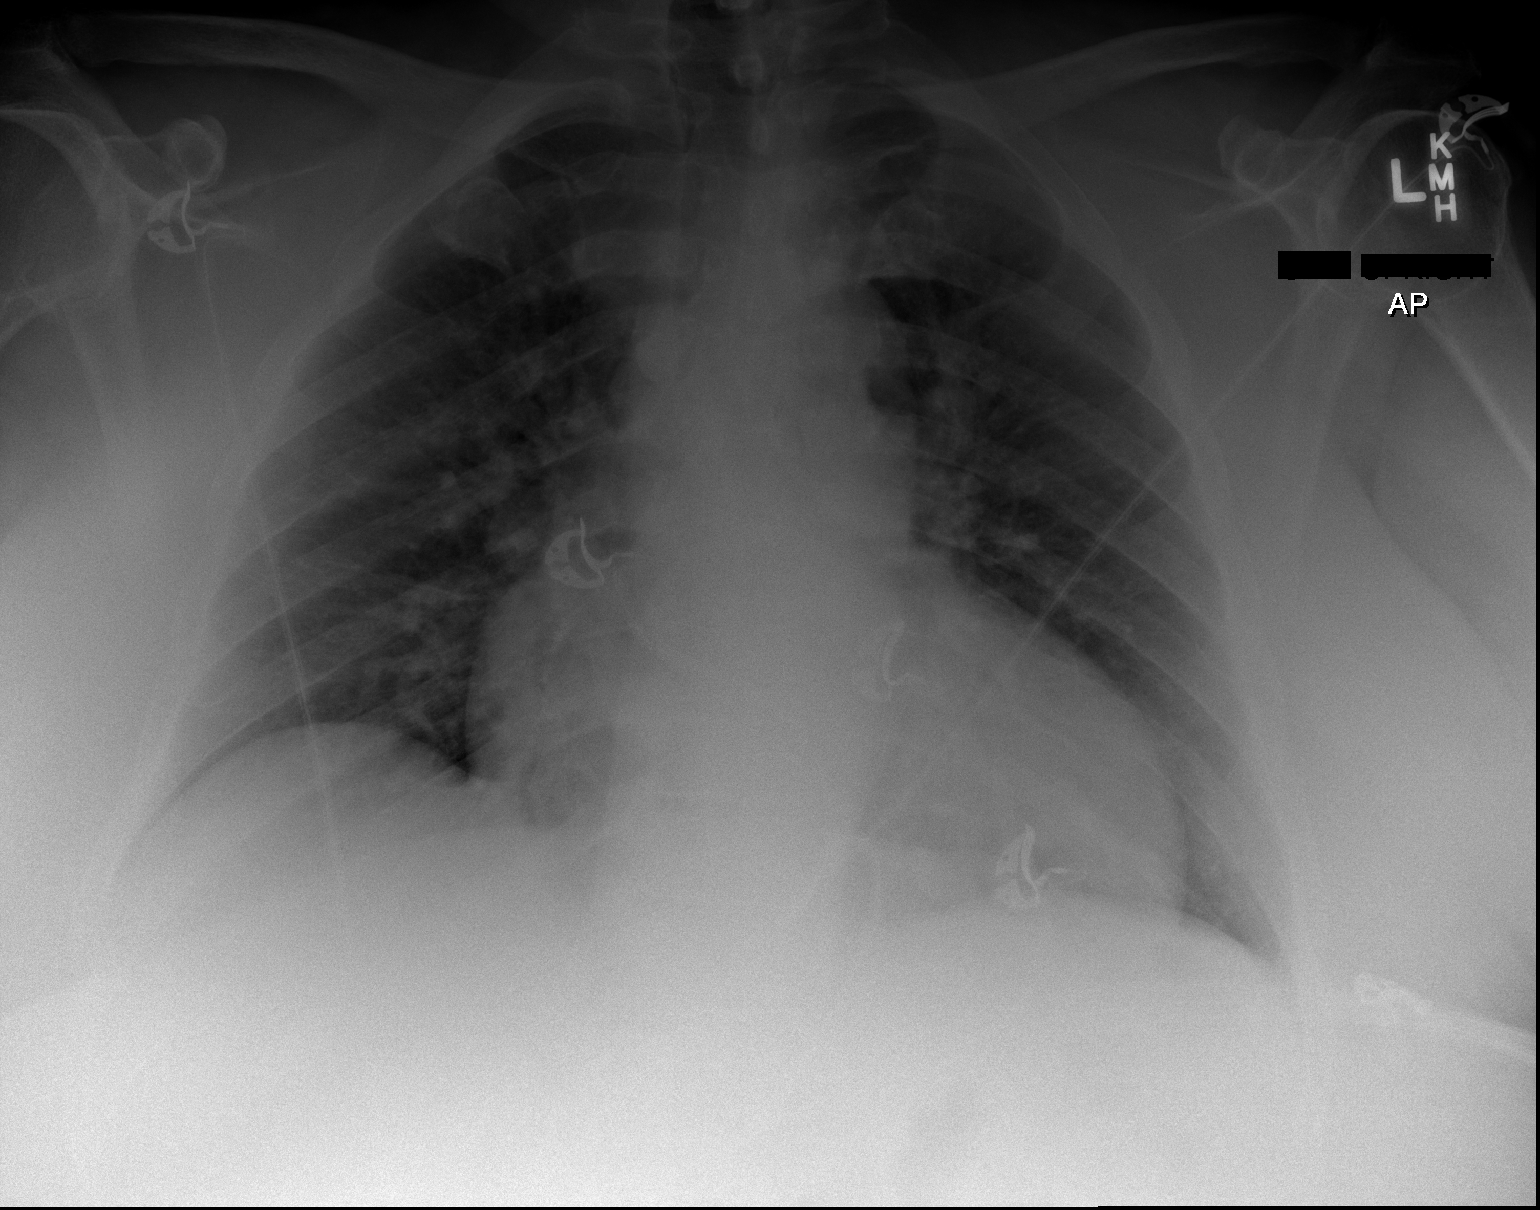

[2 of 2 positions shown; findings below may reference images not displayed]

FINDINGS: Technical factors related to patient body habitus reduce
diagnostic sensitivity and specificity.

Moderate cardiomegaly noted with pulmonary vascular indistinctness
and mild interstitial accentuation.  No overt airspace edema noted.

Posterior costophrenic angles somewhat indistinct, possibly due to
body habitus.  I cannot exclude small pleural effusions.

Mild lower thoracic spondylosis noted.

1.  Moderate cardiomegaly with suspected interstitial edema.

## 2014-05-23 ENCOUNTER — Encounter (HOSPITAL_COMMUNITY): Payer: Self-pay | Admitting: Vascular Surgery
